# Patient Record
Sex: Female | Born: 1935 | Race: White | Hispanic: No | Smoking: Former smoker
Health system: Southern US, Community
[De-identification: ages and names within clinical notes are randomized; demographics above are authoritative.]

## PROBLEM LIST (undated history)

## (undated) ENCOUNTER — Emergency Department (HOSPITAL_COMMUNITY): Payer: 59 | Source: Home / Self Care

## (undated) DIAGNOSIS — C801 Malignant (primary) neoplasm, unspecified: Secondary | ICD-10-CM

## (undated) DIAGNOSIS — E079 Disorder of thyroid, unspecified: Secondary | ICD-10-CM

## (undated) DIAGNOSIS — K219 Gastro-esophageal reflux disease without esophagitis: Secondary | ICD-10-CM

## (undated) DIAGNOSIS — M1990 Unspecified osteoarthritis, unspecified site: Secondary | ICD-10-CM

## (undated) DIAGNOSIS — C50919 Malignant neoplasm of unspecified site of unspecified female breast: Secondary | ICD-10-CM

## (undated) DIAGNOSIS — R350 Frequency of micturition: Secondary | ICD-10-CM

## (undated) DIAGNOSIS — J302 Other seasonal allergic rhinitis: Secondary | ICD-10-CM

## (undated) DIAGNOSIS — T8859XA Other complications of anesthesia, initial encounter: Secondary | ICD-10-CM

## (undated) DIAGNOSIS — E119 Type 2 diabetes mellitus without complications: Secondary | ICD-10-CM

## (undated) DIAGNOSIS — R42 Dizziness and giddiness: Secondary | ICD-10-CM

## (undated) DIAGNOSIS — E039 Hypothyroidism, unspecified: Secondary | ICD-10-CM

## (undated) DIAGNOSIS — I499 Cardiac arrhythmia, unspecified: Secondary | ICD-10-CM

## (undated) DIAGNOSIS — E785 Hyperlipidemia, unspecified: Secondary | ICD-10-CM

## (undated) DIAGNOSIS — I10 Essential (primary) hypertension: Secondary | ICD-10-CM

## (undated) DIAGNOSIS — T4145XA Adverse effect of unspecified anesthetic, initial encounter: Secondary | ICD-10-CM

## (undated) HISTORY — PX: EYE SURGERY: SHX253

## (undated) HISTORY — DX: Hyperlipidemia, unspecified: E78.5

## (undated) HISTORY — PX: MASTECTOMY: SHX3

## (undated) HISTORY — PX: TONSILLECTOMY: SUR1361

## (undated) HISTORY — PX: INCONTINENCE SURGERY: SHX676

## (undated) HISTORY — PX: CARPAL TUNNEL RELEASE: SHX101

## (undated) HISTORY — PX: COLONOSCOPY: SHX174

## (undated) HISTORY — PX: JOINT REPLACEMENT: SHX530

## (undated) HISTORY — PX: CATARACT EXTRACTION: SUR2

---

## 1997-09-24 ENCOUNTER — Ambulatory Visit (HOSPITAL_COMMUNITY): Admission: RE | Admit: 1997-09-24 | Discharge: 1997-09-24 | Payer: Self-pay | Admitting: Internal Medicine

## 1997-11-18 ENCOUNTER — Other Ambulatory Visit: Admission: RE | Admit: 1997-11-18 | Discharge: 1997-11-18 | Payer: Self-pay | Admitting: *Deleted

## 1997-11-18 ENCOUNTER — Other Ambulatory Visit: Admission: RE | Admit: 1997-11-18 | Discharge: 1997-11-18 | Payer: Self-pay | Admitting: Internal Medicine

## 1997-12-14 ENCOUNTER — Other Ambulatory Visit: Admission: RE | Admit: 1997-12-14 | Discharge: 1997-12-14 | Payer: Self-pay | Admitting: Obstetrics & Gynecology

## 1998-03-23 ENCOUNTER — Other Ambulatory Visit: Admission: RE | Admit: 1998-03-23 | Discharge: 1998-03-23 | Payer: Self-pay | Admitting: Obstetrics & Gynecology

## 1998-11-25 ENCOUNTER — Other Ambulatory Visit: Admission: RE | Admit: 1998-11-25 | Discharge: 1998-11-25 | Payer: Self-pay | Admitting: Internal Medicine

## 1998-12-16 ENCOUNTER — Ambulatory Visit (HOSPITAL_COMMUNITY): Admission: RE | Admit: 1998-12-16 | Discharge: 1998-12-16 | Payer: Self-pay | Admitting: Internal Medicine

## 1998-12-16 ENCOUNTER — Encounter: Payer: Self-pay | Admitting: Internal Medicine

## 1999-08-08 DIAGNOSIS — C50919 Malignant neoplasm of unspecified site of unspecified female breast: Secondary | ICD-10-CM

## 1999-08-08 HISTORY — DX: Malignant neoplasm of unspecified site of unspecified female breast: C50.919

## 1999-12-20 ENCOUNTER — Other Ambulatory Visit: Admission: RE | Admit: 1999-12-20 | Discharge: 1999-12-20 | Payer: Self-pay | Admitting: Internal Medicine

## 1999-12-22 ENCOUNTER — Ambulatory Visit (HOSPITAL_COMMUNITY): Admission: RE | Admit: 1999-12-22 | Discharge: 1999-12-22 | Payer: Self-pay | Admitting: Internal Medicine

## 1999-12-22 ENCOUNTER — Encounter: Payer: Self-pay | Admitting: Internal Medicine

## 1999-12-29 ENCOUNTER — Encounter: Payer: Self-pay | Admitting: Internal Medicine

## 1999-12-29 ENCOUNTER — Encounter: Admission: RE | Admit: 1999-12-29 | Discharge: 1999-12-29 | Payer: Self-pay | Admitting: Internal Medicine

## 2000-01-09 ENCOUNTER — Other Ambulatory Visit: Admission: RE | Admit: 2000-01-09 | Discharge: 2000-01-09 | Payer: Self-pay | Admitting: Surgery

## 2000-01-16 ENCOUNTER — Encounter (INDEPENDENT_AMBULATORY_CARE_PROVIDER_SITE_OTHER): Payer: Self-pay | Admitting: *Deleted

## 2000-01-16 ENCOUNTER — Other Ambulatory Visit: Admission: RE | Admit: 2000-01-16 | Discharge: 2000-01-16 | Payer: Self-pay | Admitting: Surgery

## 2000-01-16 ENCOUNTER — Encounter: Payer: Self-pay | Admitting: Surgery

## 2000-01-16 ENCOUNTER — Encounter: Admission: RE | Admit: 2000-01-16 | Discharge: 2000-01-16 | Payer: Self-pay | Admitting: Surgery

## 2000-01-31 ENCOUNTER — Other Ambulatory Visit: Admission: RE | Admit: 2000-01-31 | Discharge: 2000-01-31 | Payer: Self-pay | Admitting: Obstetrics & Gynecology

## 2000-02-02 ENCOUNTER — Encounter: Payer: Self-pay | Admitting: Surgery

## 2000-02-06 ENCOUNTER — Encounter: Payer: Self-pay | Admitting: Surgery

## 2000-02-06 ENCOUNTER — Inpatient Hospital Stay (HOSPITAL_COMMUNITY): Admission: RE | Admit: 2000-02-06 | Discharge: 2000-02-07 | Payer: Self-pay | Admitting: Surgery

## 2000-02-06 ENCOUNTER — Encounter (INDEPENDENT_AMBULATORY_CARE_PROVIDER_SITE_OTHER): Payer: Self-pay | Admitting: *Deleted

## 2000-02-24 ENCOUNTER — Ambulatory Visit (HOSPITAL_COMMUNITY): Admission: RE | Admit: 2000-02-24 | Discharge: 2000-02-24 | Payer: Self-pay | Admitting: *Deleted

## 2000-02-24 ENCOUNTER — Encounter: Payer: Self-pay | Admitting: *Deleted

## 2000-02-28 ENCOUNTER — Ambulatory Visit (HOSPITAL_COMMUNITY): Admission: RE | Admit: 2000-02-28 | Discharge: 2000-02-28 | Payer: Self-pay | Admitting: *Deleted

## 2000-02-28 ENCOUNTER — Encounter: Payer: Self-pay | Admitting: *Deleted

## 2000-05-10 ENCOUNTER — Other Ambulatory Visit: Admission: RE | Admit: 2000-05-10 | Discharge: 2000-05-10 | Payer: Self-pay | Admitting: Obstetrics & Gynecology

## 2000-06-06 ENCOUNTER — Encounter: Admission: RE | Admit: 2000-06-06 | Discharge: 2000-09-04 | Payer: Self-pay | Admitting: Radiation Oncology

## 2000-06-15 ENCOUNTER — Encounter: Payer: Self-pay | Admitting: *Deleted

## 2000-06-15 ENCOUNTER — Ambulatory Visit (HOSPITAL_COMMUNITY): Admission: RE | Admit: 2000-06-15 | Discharge: 2000-06-15 | Payer: Self-pay | Admitting: *Deleted

## 2000-10-30 ENCOUNTER — Encounter: Payer: Self-pay | Admitting: *Deleted

## 2000-10-30 ENCOUNTER — Encounter: Admission: RE | Admit: 2000-10-30 | Discharge: 2000-10-30 | Payer: Self-pay | Admitting: *Deleted

## 2001-03-11 ENCOUNTER — Other Ambulatory Visit: Admission: RE | Admit: 2001-03-11 | Discharge: 2001-03-11 | Payer: Self-pay | Admitting: Obstetrics & Gynecology

## 2001-03-19 ENCOUNTER — Encounter: Payer: Self-pay | Admitting: *Deleted

## 2001-03-19 ENCOUNTER — Ambulatory Visit (HOSPITAL_COMMUNITY): Admission: RE | Admit: 2001-03-19 | Discharge: 2001-03-19 | Payer: Self-pay | Admitting: *Deleted

## 2001-03-20 ENCOUNTER — Encounter: Payer: Self-pay | Admitting: *Deleted

## 2001-03-20 ENCOUNTER — Ambulatory Visit (HOSPITAL_COMMUNITY): Admission: RE | Admit: 2001-03-20 | Discharge: 2001-03-20 | Payer: Self-pay | Admitting: *Deleted

## 2001-06-05 ENCOUNTER — Encounter (INDEPENDENT_AMBULATORY_CARE_PROVIDER_SITE_OTHER): Payer: Self-pay | Admitting: *Deleted

## 2001-06-05 ENCOUNTER — Ambulatory Visit (HOSPITAL_BASED_OUTPATIENT_CLINIC_OR_DEPARTMENT_OTHER): Admission: RE | Admit: 2001-06-05 | Discharge: 2001-06-05 | Payer: Self-pay | Admitting: Surgery

## 2001-10-30 ENCOUNTER — Ambulatory Visit (HOSPITAL_COMMUNITY): Admission: RE | Admit: 2001-10-30 | Discharge: 2001-10-30 | Payer: Self-pay | Admitting: Gastroenterology

## 2001-11-07 ENCOUNTER — Encounter: Payer: Self-pay | Admitting: *Deleted

## 2001-11-07 ENCOUNTER — Ambulatory Visit (HOSPITAL_COMMUNITY): Admission: RE | Admit: 2001-11-07 | Discharge: 2001-11-07 | Payer: Self-pay | Admitting: *Deleted

## 2002-03-21 ENCOUNTER — Other Ambulatory Visit: Admission: RE | Admit: 2002-03-21 | Discharge: 2002-03-21 | Payer: Self-pay | Admitting: Obstetrics & Gynecology

## 2002-06-05 ENCOUNTER — Ambulatory Visit (HOSPITAL_COMMUNITY): Admission: RE | Admit: 2002-06-05 | Discharge: 2002-06-05 | Payer: Self-pay | Admitting: *Deleted

## 2002-06-05 ENCOUNTER — Encounter: Payer: Self-pay | Admitting: *Deleted

## 2002-11-10 ENCOUNTER — Encounter: Admission: RE | Admit: 2002-11-10 | Discharge: 2002-11-10 | Payer: Self-pay | Admitting: *Deleted

## 2002-11-10 ENCOUNTER — Encounter: Payer: Self-pay | Admitting: *Deleted

## 2003-05-05 ENCOUNTER — Other Ambulatory Visit: Admission: RE | Admit: 2003-05-05 | Discharge: 2003-05-05 | Payer: Self-pay | Admitting: Obstetrics & Gynecology

## 2003-11-13 ENCOUNTER — Encounter: Admission: RE | Admit: 2003-11-13 | Discharge: 2003-11-13 | Payer: Self-pay | Admitting: Oncology

## 2004-01-06 ENCOUNTER — Encounter: Admission: RE | Admit: 2004-01-06 | Discharge: 2004-01-06 | Payer: Self-pay | Admitting: Urology

## 2004-01-07 ENCOUNTER — Ambulatory Visit (HOSPITAL_COMMUNITY): Admission: RE | Admit: 2004-01-07 | Discharge: 2004-01-07 | Payer: Self-pay | Admitting: Urology

## 2004-01-07 ENCOUNTER — Encounter (INDEPENDENT_AMBULATORY_CARE_PROVIDER_SITE_OTHER): Payer: Self-pay | Admitting: Specialist

## 2004-01-07 ENCOUNTER — Ambulatory Visit (HOSPITAL_BASED_OUTPATIENT_CLINIC_OR_DEPARTMENT_OTHER): Admission: RE | Admit: 2004-01-07 | Discharge: 2004-01-07 | Payer: Self-pay | Admitting: Urology

## 2004-08-17 ENCOUNTER — Ambulatory Visit: Payer: Self-pay | Admitting: Oncology

## 2004-11-14 ENCOUNTER — Encounter: Admission: RE | Admit: 2004-11-14 | Discharge: 2004-11-14 | Payer: Self-pay | Admitting: Oncology

## 2005-02-16 ENCOUNTER — Ambulatory Visit: Payer: Self-pay | Admitting: Oncology

## 2005-06-05 ENCOUNTER — Other Ambulatory Visit: Admission: RE | Admit: 2005-06-05 | Discharge: 2005-06-05 | Payer: Self-pay | Admitting: Obstetrics & Gynecology

## 2005-09-04 ENCOUNTER — Ambulatory Visit (HOSPITAL_COMMUNITY): Admission: RE | Admit: 2005-09-04 | Discharge: 2005-09-04 | Payer: Self-pay | Admitting: Internal Medicine

## 2005-09-13 ENCOUNTER — Ambulatory Visit (HOSPITAL_COMMUNITY): Admission: RE | Admit: 2005-09-13 | Discharge: 2005-09-14 | Payer: Self-pay | Admitting: Urology

## 2005-10-26 ENCOUNTER — Ambulatory Visit: Payer: Self-pay | Admitting: Oncology

## 2005-11-15 ENCOUNTER — Encounter: Admission: RE | Admit: 2005-11-15 | Discharge: 2005-11-15 | Payer: Self-pay | Admitting: Oncology

## 2005-12-18 ENCOUNTER — Ambulatory Visit (HOSPITAL_COMMUNITY): Admission: RE | Admit: 2005-12-18 | Discharge: 2005-12-18 | Payer: Self-pay | Admitting: Oncology

## 2005-12-20 ENCOUNTER — Ambulatory Visit: Payer: Self-pay | Admitting: Oncology

## 2006-06-13 ENCOUNTER — Ambulatory Visit: Payer: Self-pay | Admitting: Oncology

## 2006-06-15 LAB — COMPREHENSIVE METABOLIC PANEL
AST: 16 U/L (ref 0–37)
Alkaline Phosphatase: 97 U/L (ref 39–117)
Glucose, Bld: 123 mg/dL — ABNORMAL HIGH (ref 70–99)
Sodium: 137 mEq/L (ref 135–145)
Total Bilirubin: 0.4 mg/dL (ref 0.3–1.2)
Total Protein: 7 g/dL (ref 6.0–8.3)

## 2006-06-15 LAB — CBC WITH DIFFERENTIAL/PLATELET
EOS%: 0.9 % (ref 0.0–7.0)
Eosinophils Absolute: 0.1 10*3/uL (ref 0.0–0.5)
LYMPH%: 15.3 % (ref 14.0–48.0)
MCH: 32.3 pg (ref 26.0–34.0)
MCHC: 33.9 g/dL (ref 32.0–36.0)
MCV: 95.3 fL (ref 81.0–101.0)
MONO%: 5.8 % (ref 0.0–13.0)
NEUT#: 9.2 10*3/uL — ABNORMAL HIGH (ref 1.5–6.5)
Platelets: 261 10*3/uL (ref 145–400)
RBC: 4.01 10*6/uL (ref 3.70–5.32)

## 2006-11-19 ENCOUNTER — Ambulatory Visit (HOSPITAL_COMMUNITY): Admission: RE | Admit: 2006-11-19 | Discharge: 2006-11-19 | Payer: Self-pay | Admitting: Internal Medicine

## 2007-01-02 ENCOUNTER — Ambulatory Visit (HOSPITAL_COMMUNITY): Admission: RE | Admit: 2007-01-02 | Discharge: 2007-01-02 | Payer: Self-pay | Admitting: Internal Medicine

## 2007-07-01 ENCOUNTER — Ambulatory Visit: Payer: Self-pay

## 2007-07-24 ENCOUNTER — Ambulatory Visit: Payer: Self-pay | Admitting: Oncology

## 2007-07-24 LAB — CBC WITH DIFFERENTIAL/PLATELET
Basophils Absolute: 0.1 10*3/uL (ref 0.0–0.1)
Eosinophils Absolute: 0.1 10*3/uL (ref 0.0–0.5)
HGB: 13 g/dL (ref 11.6–15.9)
LYMPH%: 23.9 % (ref 14.0–48.0)
MCV: 93.7 fL (ref 81.0–101.0)
MONO%: 8.3 % (ref 0.0–13.0)
NEUT#: 4.8 10*3/uL (ref 1.5–6.5)
Platelets: 223 10*3/uL (ref 145–400)
RBC: 4.03 10*6/uL (ref 3.70–5.32)

## 2007-07-24 LAB — COMPREHENSIVE METABOLIC PANEL
Alkaline Phosphatase: 75 U/L (ref 39–117)
BUN: 26 mg/dL — ABNORMAL HIGH (ref 6–23)
Glucose, Bld: 75 mg/dL (ref 70–99)
Total Bilirubin: 0.5 mg/dL (ref 0.3–1.2)

## 2008-01-06 ENCOUNTER — Ambulatory Visit (HOSPITAL_COMMUNITY): Admission: RE | Admit: 2008-01-06 | Discharge: 2008-01-06 | Payer: Self-pay | Admitting: Family Medicine

## 2008-11-18 ENCOUNTER — Ambulatory Visit (HOSPITAL_COMMUNITY): Admission: RE | Admit: 2008-11-18 | Discharge: 2008-11-18 | Payer: Self-pay | Admitting: Internal Medicine

## 2009-01-20 ENCOUNTER — Ambulatory Visit (HOSPITAL_COMMUNITY): Admission: RE | Admit: 2009-01-20 | Discharge: 2009-01-20 | Payer: Self-pay | Admitting: Internal Medicine

## 2009-04-27 ENCOUNTER — Emergency Department (HOSPITAL_COMMUNITY): Admission: EM | Admit: 2009-04-27 | Discharge: 2009-04-27 | Payer: Self-pay | Admitting: Emergency Medicine

## 2010-01-24 ENCOUNTER — Ambulatory Visit (HOSPITAL_COMMUNITY): Admission: RE | Admit: 2010-01-24 | Discharge: 2010-01-24 | Payer: Self-pay | Admitting: Internal Medicine

## 2010-08-28 ENCOUNTER — Encounter: Payer: Self-pay | Admitting: Oncology

## 2010-12-23 NOTE — Op Note (Signed)
NAME:  [name], [name]                         ACCOUNT NO.:  [id number]   MEDICAL RECORD NO.:  [id number]                   PATIENT TYPE:  AMB   LOCATION:  [facility]                                 FACILITY:  [facility]   PHYSICIAN:  Sigmund I. Patsi Sears, M.D.         DATE OF BIRTH:  08-Oct-1927   DATE OF PROCEDURE:  01/07/2004  DATE OF DISCHARGE:                                 OPERATIVE REPORT   PREOPERATIVE DIAGNOSIS:  Bladder lesion; history of breast cancer.   POSTOPERATIVE DIAGNOSIS:  Bladder lesion; history of breast cancer.   PROCEDURE:  1. Cystoscopy.  2. Cold cup biopsy of bladder lesion.  3. Fulguration of biopsy site.  4. Placement of E-string.   ATTENDING SURGEON:  Jethro Bolus, MD   RESIDENT SURGEON:  Rhae Lerner, MD   ANESTHESIA:  General endotracheal.   COMPLICATIONS:  None.   INDICATION FOR PROCEDURE:  Ms. Sachse is a 77 year old female, who was  found to have a suspicious bladder lesion on office cystoscopy.  The patient  presents for cold cup biopsy of this lesion.   DESCRIPTION OF THE PROCEDURE IN DETAIL:  The patient was brought to the  operating room.  Following induction of anesthesia, was placed in the dorsal  lithotomy position and prepped and draped in the usual sterile fashion.  A  rigid cystoscope was subsequently introduced into the bladder and a  cystoscopic examination performed.  Both right and left ureteral orifices  were identified and efflux of clear urine confirmed from each.  The  remainder of the bladder trigone appeared to be free of any signs of tumor,  stone, diverticulum, or other abnormality.  A complete examination of the  bladder mucosa was subsequently performed in a systematic fashion.  The  bladder was noted to have grade 2 trabeculation throughout.  There were no  signs of any tumor, stone, or diverticulum present.  On the posterior dome  of the bladder, lying just posterior to the air bubble and the midline, a  small bluish lesion was present, coinciding with the unusual lesion noted on  the office cystoscopy.  A cold cup biopsy forceps was subsequently  introduced through the introducer sheath and used to remove this lesion.  The lesion was subsequently sent for pathology.  Bovie cautery was then used  to fulgurate the biopsy site.  Prior to removing the cystoscope, the  patient's bladder capacity was noted to be greater than 1000 mL.  The  cystoscope was subsequently removed after drainage of the bladder and an E-  string placed into the vaginal vault.  At this point, the patient was  allowed to  awaken, and the procedure was ended.  The patient tolerated the procedure  well.  There were no complications.  Please note that Dr. Jethro Bolus  was present through the entire case and participated in all aspects of the  procedure.     Bailey Mech, MD  Sigmund I. Patsi Sears, M.D.    JP/MEDQ  D:  01/07/2004  T:  01/07/2004  Job:  [id number]

## 2010-12-23 NOTE — Op Note (Signed)
[name], [name]               ACCOUNT NO.:  [id number]   MEDICAL RECORD NO.:  [id number]          PATIENT TYPE:  OIB   LOCATION:  1010                         FACILITY:  [facility]   PHYSICIAN:  Valetta Fuller, M.D.  DATE OF BIRTH:  January 06, 1928   DATE OF PROCEDURE:  09/13/2005  DATE OF DISCHARGE:                                 OPERATIVE REPORT   PREOPERATIVE DIAGNOSIS:  Stress incontinence.   POSTOPERATIVE DIAGNOSIS:  Stress incontinence.   PROCEDURE PERFORMED:  Retropubic suburethral sling (Lynx system).   SURGEON:  Valetta Fuller, M.D.   ANESTHESIA:  General.   INDICATIONS:  [name] is 77 years of age. She saw one of my partners in  the past for further assessment of incontinence but chose to transfer her  care to my practice. The patient complained primarily of stress  incontinence. She has had some mixed incontinence in the past but her  primary complaint has been stress urinary leakage. The patient previously  had urodynamics which showed a stable relatively compliant bladder. She did  have objective stress leakage with a reduced Valsalva leak point pressure of  approximately 50 cm of water pressure. Clinical exam showed a mild cystocele  but nothing that really required surgical correction. She did have evidence  of obvious urethral hypermobility. We discussed several options with her.  She felt that this was interfering with her quality of life enough to a  point where she would prefer surgical correction. She appeared to understand  the potential advantages as well as the complications and disadvantages of  this type of surgery. She appeared to understand the success rates and the  possible complications and problems that can occur. We felt that a  retropubic sling might be better than a transobturator sling given her lower  Valsalva leak point pressure. She presents now for that procedure.   TECHNIQUE AND FINDINGS:  The patient was brought to the operating room  where  she had successful induction of general anesthesia. She was placed in  lithotomy position, prepped and draped in the usual manner. She was placed  lithotomy position. A Foley catheter was inserted and her bladder was  completely drained. A weighted vaginal speculum was utilized. Again she had  a grade 1 cystocele. She did have some atrophic vaginal change. The anterior  vaginal wall overlying the urethra was infiltrated with lidocaine. A small  midline incision was made over the mid urethra. Vaginal planes were then  dissected enough to allow for finger palpation of the endopelvic fascia. Two  separate small stab incisions were made just lateral to the midline at the  top of the pubic symphysis. The [place] curved needle passers were then passed  behind the pubic symphysis with direct finger tip control until we poked  through the endopelvic fascia and then these were brought out lateral to the  urethra bilaterally. With the needles in positioned, the Foley catheter was  removed and flexible cystoscopy was performed. I saw no evidence of any  bladder injury and the needles did not appear to be in the bladder. They  appeared  to be well positioned at the level of the bladder neck laterally on  both sides. The sling was then grasped with the needle passers and brought  up to the mid urethra. A right angled clamp was used underneath the sling to  assure proper tensioning. The redundant sling was then cut at the level of  the skin after the sheath was removed. Of note, this was done after the  cystoscope was removed and the Foley catheter was reinserted and the bladder  completely drained. A vaginal incision was copiously  irrigated. The vaginal incision was then closed with a running 2-0 Vicryl  suture and some packing with Estrace was applied. The Foley catheter was  left to gravity drainage. The patient appeared to tolerate the procedure  well and there were no obvious  complications.           ______________________________  Valetta Fuller, M.D.  Electronically Signed     DSG/MEDQ  D:  09/13/2005  T:  09/13/2005  Job:  [id number]

## 2010-12-23 NOTE — Op Note (Signed)
[place]  Patient:    [name], [name] Visit Number: [id number] MRN: [id number]          Service Type: END Location: ENDO Attending Physician:  Nelda Marseille Dictated by:   Petra Kuba, M.D. Proc. Date: 10/30/01 Admit Date:  10/30/2001   CC:         Marinus Maw, M.D.  Aliene Altes, M.D.   Operative Report  PROCEDURE:  Colonoscopy.  INDICATIONS FOR PROCEDURE:  Mild gastrointestinal symptoms, due for colonic screening.  CONSENT:  Consent was signed after risks, benefits, methods, and options were thoroughly discussed in the office.  MEDICATIONS USED: 1. Demerol 25 mg. 2. Versed 4 mg.  DESCRIPTION OF PROCEDURE:  Rectal inspection was pertinent for small external hemorrhoids.  Digital examination was negative.  Video pediatric adjustable colonoscope was inserted, and despite a tortuous colon, by rolling her on her back and some abdominal pressure, was able to advance to the cecum which was identified by the appendiceal orifice and the ileocecal valve.  No abnormalities were seen on insertion.  The scope was slowly withdrawn.  Prep was adequate.  There was some liquid stool that required washing and suctioning.  She was tortuous in the sigmoid and the flexures, and when we fell back around a loop we did try to readvance around the curves to decrease the chance of missing things.  On slow withdrawal back to the rectum no abnormalities were seen.  Once back in the rectum the scope was retroflexed. Pertinent for some internal hemorrhoids.  Scope was straightened, and then advanced a short ways up the sigmoid.  Air was suctioned, scope was removed. The patient tolerated the procedure well.  There was no obvious immediate complication.  ENDOSCOPIC DIAGNOSES: 1. Internal and external hemorrhoids. 2. Otherwise within normal limits to the cecum.  PLAN:  Happy to see back p.r.n.  Otherwise, return care to Dr. Katrinka Blazing and  Dr. Oneta Rack for the customary health care maintenance to include yearly rectals and guaiacs, and if doing well medically, would repeat colonic screening in 5 years with consideration based on tortuosity of a one time either BE or virtual colonoscopy or repeating colonoscopy. Dictated by:   Petra Kuba, M.D. Attending Physician:  Nelda Marseille DD:  10/30/01 TD:  10/31/01 Job: [id number]

## 2010-12-23 NOTE — Op Note (Signed)
[place]  Patient:    [name], [name]                        MRN: [id number] Proc. Date: 02/06/00 Attending:  Currie Paris, M.D. CC:         Marinus Maw, M.D.                           Operative Report  ACCOUNT NUMBER:  [id number]  OFFICE MEDICAL RECORD NUMBER:  [id number]  PREOPERATIVE DIAGNOSIS:  Invasive lobular carcinoma of the right breast.  POSTOPERATIVE DIAGNOSIS:  Invasive lobular carcinoma of the right breast.  OPERATION:  Right total mastectomy with blue dye injection and sentinel node dissection.  SURGEON:  Currie Paris, M.D.  ASSISTANT:  ANESTHESIA:  General endotracheal.  INDICATIONS:  This patient is a 77 year old with a mass in the right breast in about the mid lateral position, perhaps a little more to the upper outer quadrant than the lower outer.  Cor biopsy had shown this to be invasive lobular.  Discussions were held with the patient regarding treatment and she requested mastectomy with sentinel node dissection.  DESCRIPTION OF PROCEDURE:  The patient was brought to the operating room, having had her radioactive dye injected two hours preoperatively.  After satisfactory general endotracheal anesthesia was obtained, the breast was prepped and draped.  An elliptical incision was outlined.  Blue dye was injected subareolarly and around the mass and in the dermis overlying the mass.  A needle probe was used to localize the hot area, which was low anterior axilla and the skin overlying this was marked.  A skin incision was then made for the superior half of the flap and the superior flap raised medially to the sternum, superiorly to the clavicle, and laterally until we lifted up the axillary skin.  At this point, I used the needle probe and saw a little blue with an area that appeared to be hot and excised that, but this was some blue dye that had leaked down some of the soft tissue planes and there  was no area that was hot.  I thought that this represented breast tissue and not a true nodal area.  However, the blue lymphatics were then noted going into the axilla and using the needle probe, another hot area was identified with the blue dye going directly into this. This was in the true axilla whereas the first nodule was superficial to the axilla.  This hot blue node was excised.  There actually to be two together.  Another blue lymphatic was noted and traced and another hot node identified that was also blue and excised.  There were no other blue nodes nor any other hot nodes nor any other palpable adenopathy remaining in the axilla.  While waiting for pathology to review these nodes, the inferior flap was made going medially to the sternum, inferiorly to just below the inframammary fold, and laterally to the latissimus.  The breast was removed from the underlying muscle using coagulation current of the cautery.  The final lateral attachments to the latissimus, serratus, and axilla were divided with the cautery.  At this point, pathology reported that there was no obvious malignant disease, although we do recognize with lobular that touch preps can be false negatives.  Nevertheless, there appeared to be no other active metastatic disease within the axilla at least grossly nor even any  enlarged lymph nodes.  The wound was copiously irrigated and checked for hemostasis.  Once everything was dry, I placed two 19 Blake drains and closed the skin with staples.  The drains were secured with a 3-0 nylon.  The patient tolerated the procedure well.  There were no operative complications.  All counts were correct. DD:  02/06/00 TD:  02/06/00 Job: [id number]

## 2011-01-04 ENCOUNTER — Other Ambulatory Visit: Payer: Self-pay | Admitting: Internal Medicine

## 2011-02-22 ENCOUNTER — Other Ambulatory Visit: Payer: Self-pay | Admitting: Internal Medicine

## 2011-03-09 ENCOUNTER — Other Ambulatory Visit (HOSPITAL_COMMUNITY): Payer: Self-pay | Admitting: Internal Medicine

## 2011-03-09 DIAGNOSIS — Z1231 Encounter for screening mammogram for malignant neoplasm of breast: Secondary | ICD-10-CM

## 2011-03-30 ENCOUNTER — Encounter (HOSPITAL_COMMUNITY)
Admission: RE | Admit: 2011-03-30 | Discharge: 2011-03-30 | Disposition: A | Discharge status: Home / Self Care | Payer: Medicare Other | Source: Ambulatory Visit | Attending: Orthopaedic Surgery | Admitting: Orthopaedic Surgery

## 2011-03-30 ENCOUNTER — Other Ambulatory Visit (HOSPITAL_COMMUNITY): Payer: Self-pay | Admitting: Orthopaedic Surgery

## 2011-03-30 ENCOUNTER — Ambulatory Visit (HOSPITAL_COMMUNITY)
Admission: RE | Admit: 2011-03-30 | Discharge: 2011-03-30 | Disposition: A | Discharge status: Home / Self Care | Payer: Medicare Other | Source: Ambulatory Visit | Attending: Orthopaedic Surgery | Admitting: Orthopaedic Surgery

## 2011-03-30 DIAGNOSIS — Z01811 Encounter for preprocedural respiratory examination: Secondary | ICD-10-CM | POA: Insufficient documentation

## 2011-03-30 DIAGNOSIS — M1711 Unilateral primary osteoarthritis, right knee: Secondary | ICD-10-CM

## 2011-03-30 DIAGNOSIS — IMO0002 Reserved for concepts with insufficient information to code with codable children: Secondary | ICD-10-CM | POA: Insufficient documentation

## 2011-03-30 DIAGNOSIS — Z01812 Encounter for preprocedural laboratory examination: Secondary | ICD-10-CM | POA: Insufficient documentation

## 2011-03-30 DIAGNOSIS — M1710 Unilateral primary osteoarthritis, unspecified knee: Secondary | ICD-10-CM | POA: Insufficient documentation

## 2011-03-30 DIAGNOSIS — Z01818 Encounter for other preprocedural examination: Secondary | ICD-10-CM | POA: Insufficient documentation

## 2011-03-30 LAB — DIFFERENTIAL
Lymphocytes Relative: 26 % (ref 12–46)
Neutro Abs: 5.4 10*3/uL (ref 1.7–7.7)

## 2011-03-30 LAB — COMPREHENSIVE METABOLIC PANEL
ALT: 18 U/L (ref 0–35)
AST: 23 U/L (ref 0–37)
CO2: 33 mEq/L — ABNORMAL HIGH (ref 19–32)
Chloride: 95 mEq/L — ABNORMAL LOW (ref 96–112)
GFR calc Af Amer: >60 mL/min (ref >60)
GFR calc non Af Amer: 59 mL/min — ABNORMAL LOW (ref >60)
Glucose, Bld: 78 mg/dL (ref 70–99)
Sodium: 135 mEq/L (ref 135–145)
Total Bilirubin: 0.3 mg/dL (ref 0.3–1.2)

## 2011-03-30 LAB — URINALYSIS, ROUTINE W REFLEX MICROSCOPIC
Glucose, UA: NEGATIVE mg/dL
Hgb urine dipstick: NEGATIVE
Ketones, ur: NEGATIVE mg/dL
Protein, ur: NEGATIVE mg/dL
Specific Gravity, Urine: 1.012 (ref 1.005–1.030)

## 2011-03-30 LAB — CBC
Hemoglobin: 13.3 g/dL (ref 12.0–15.0)
MCH: 32 pg (ref 26.0–34.0)
MCV: 94 fL (ref 78.0–100.0)
RBC: 4.16 MIL/uL (ref 3.87–5.11)

## 2011-03-30 LAB — ABO/RH: ABO/RH(D): O POS

## 2011-03-30 LAB — PROTIME-INR: Prothrombin Time: 12.9 seconds (ref 11.6–15.2)

## 2011-03-30 LAB — APTT: aPTT: 38 seconds — ABNORMAL HIGH (ref 24–37)

## 2011-03-31 LAB — URINE CULTURE
Colony Count: NO GROWTH
Culture  Setup Time: 201208231248

## 2011-04-05 ENCOUNTER — Ambulatory Visit (HOSPITAL_COMMUNITY)
Admission: RE | Admit: 2011-04-05 | Discharge: 2011-04-05 | Disposition: A | Discharge status: Home / Self Care | Payer: Medicare Other | Source: Ambulatory Visit | Attending: Internal Medicine | Admitting: Internal Medicine

## 2011-04-05 DIAGNOSIS — Z1231 Encounter for screening mammogram for malignant neoplasm of breast: Secondary | ICD-10-CM | POA: Insufficient documentation

## 2011-04-11 ENCOUNTER — Inpatient Hospital Stay (HOSPITAL_COMMUNITY)
Admission: RE | Admit: 2011-04-11 | Discharge: 2011-04-14 | DRG: 470 | Disposition: A | Discharge status: Skilled Nursing Facility | Payer: Medicare Other | Source: Ambulatory Visit | Attending: Orthopaedic Surgery | Admitting: Orthopaedic Surgery

## 2011-04-11 DIAGNOSIS — I10 Essential (primary) hypertension: Secondary | ICD-10-CM | POA: Diagnosis present

## 2011-04-11 DIAGNOSIS — E119 Type 2 diabetes mellitus without complications: Secondary | ICD-10-CM | POA: Diagnosis present

## 2011-04-11 DIAGNOSIS — Z853 Personal history of malignant neoplasm of breast: Secondary | ICD-10-CM

## 2011-04-11 DIAGNOSIS — E039 Hypothyroidism, unspecified: Secondary | ICD-10-CM | POA: Diagnosis present

## 2011-04-11 DIAGNOSIS — M1710 Unilateral primary osteoarthritis, unspecified knee: Principal | ICD-10-CM | POA: Diagnosis present

## 2011-04-11 DIAGNOSIS — Z79899 Other long term (current) drug therapy: Secondary | ICD-10-CM

## 2011-04-11 DIAGNOSIS — D62 Acute posthemorrhagic anemia: Secondary | ICD-10-CM | POA: Diagnosis not present

## 2011-04-11 LAB — GLUCOSE, CAPILLARY
Glucose-Capillary: 96 mg/dL (ref 70–99)
Glucose-Capillary: 143 mg/dL — ABNORMAL HIGH (ref 70–99)

## 2011-04-12 LAB — GLUCOSE, CAPILLARY
Glucose-Capillary: 118 mg/dL — ABNORMAL HIGH (ref 70–99)
Glucose-Capillary: 167 mg/dL — ABNORMAL HIGH (ref 70–99)

## 2011-04-12 LAB — CBC
HCT: 27.6 % — ABNORMAL LOW (ref 36.0–46.0)
HCT: 28.1 % — ABNORMAL LOW (ref 36.0–46.0)
Hemoglobin: 9.4 g/dL — ABNORMAL LOW (ref 12.0–15.0)
MCH: 32.2 pg (ref 26.0–34.0)
MCHC: 34.1 g/dL (ref 30.0–36.0)
Platelets: 187 10*3/uL (ref 150–400)
RDW: 14 % (ref 11.5–15.5)
WBC: 10.6 10*3/uL — ABNORMAL HIGH (ref 4.0–10.5)

## 2011-04-12 LAB — BASIC METABOLIC PANEL
BUN: 17 mg/dL (ref 6–23)
GFR calc non Af Amer: 55 mL/min — ABNORMAL LOW (ref >60)
Glucose, Bld: 113 mg/dL — ABNORMAL HIGH (ref 70–99)
Potassium: 4.3 mEq/L (ref 3.5–5.1)

## 2011-04-12 NOTE — Op Note (Signed)
[name], Bridget NO.:  [id number]  MEDICAL RECORD NO.:  [id number]  LOCATION:  2857                         FACILITY:  [facility]  PHYSICIAN:  Claude Manges. Cedar Roseman, M.D.DATE OF BIRTH:  03-Nov-1927  DATE OF PROCEDURE:  04/11/2011 DATE OF DISCHARGE:                              OPERATIVE REPORT   PREOPERATIVE DIAGNOSIS:  End-stage osteoarthritis, right knee.  POSTOPERATIVE DIAGNOSIS:  End-stage osteoarthritis, right knee.  PROCEDURE:  Right total knee replacement surgery.  SURGEON:  Claude Manges. Cleophas Dunker, MD  ASSISTANT:  Oris Drone. Petrarca, PAC  ANESTHESIA:  General with supplemental femoral nerve block.  COMPLICATIONS:  None.  COMPONENTS:  DePuy LCS medium femoral component #2 rotating keeled tibial tray, 10-mm bridging bearing, 3-peg metal back rotating patella, components were secured with polymethyl methacrylate.  DESCRIPTION OF PROCEDURE:  Ms. Gittens was met in the holding area, identified the right knee as the appropriate operative site, did receive a preoperative femoral nerve block.  The patient was then transported room #1 and placed under general anesthesia without difficulty.  Nursing staff inserted a Foley catheter.  Urine was clear.  The right lower extremity was placed in thigh tourniquet.  The leg was prepped with Betadine scrub and DuraPrep.  The tourniquet to the midfoot, sterile draping was performed.  A midline longitudinal incision was made, centered about the patella, extending from the superior pouch to the tibial tubercle via sharp dissection, incision was carried down to subcutaneous tissue.  First layer of capsule was incised in midline and medial parapatellar incision was then made the Bovie through the deep capsule.  There was minimal clear yellow joint effusion.  The patella was everted to 180 degrees and the knee flexed to 90 degrees.  There was near complete absence of articular cartilage on the lateral femoral condyle with  large osteophytes particularly laterally and to lesser extent along the medial femoral condyle.  There large flakes of articular cartilage loose from the lateral femoral condyle.  The patient had increased valgus with weightbearing.  A synovectomy was performed.  Osteophytes removed and measured a medium femoral component.  Initial cut was then made transversely in the proximal tibia the 7- degree angle of declination.  We checked our alignment on several occasions before accepting in the final cut, thought it was a perfectly anatomic.  Subsequent cuts were then made on the femur using the medium femoral guide, a 4-degree distal femoral valgus cut was utilized.  MCL and LCL remained intact throughout the procedure.  Flexion and extension gaps were perfectly symmetrical at 10 mm.  Lamina spreader was inserted into the joint, I removed the medial lateral menisci as well as ACL and PCL and partially debrided the Baker cyst posterior medially.  Final cut was then made in the femur for tapering and to obtain the center holes.  Osteophytes removed from the posterior femoral condyle with a curved three-quarter inch osteotome.  Retractor was then placed about the tibia.  We measured a #2 tibial tray.  The center hole was then made followed by the keeled cut with the tibial jig still in place.  We applied to 10 mm bridging bearing followed by the trial femoral component.  We had full range of motion and full extension and no opening with varus or valgus stress and very nice alignment in terms of the rotating platform.  The patella was prepared by removing 10 mm of bone leaving 13 mm patella thickness, trial patella was applied until full range of motion was perfectly stable.  Trial components removed.  The joint was copiously irrigated with saline solution.  Retractor was then placed about the tibia.  Tibia was initially inserted with methacrylate, it was impacted and placed after  finger packing the center hole.  Extraneous methacrylate was removed from around its edges. The femur was then impacted with the methacrylate.  The knee placed in full extension to further impacted components.  There appeared to be in anatomic position.  Any further extraneous methacrylate was removed and we irrigated the joint with saline.  Patella was then applied with methacrylate and patellar clamp.  Approximately 15 minutes methacrylate was matured and hardened, the joint was explored.  There was no further extraneous methacrylate.  The wound was irrigated with saline solution.  0.25% Marcaine with epinephrine injected into the deep capsule circumferentially. Tourniquet was then deflated at approximately an hour and 10 minutes. Gross bleeders were Bovie coagulated.  Bone wax was placed around bleeding bone edges.  Hemovac was inserted.  Deep capsule was then closed with interrupted #1 Ethibond.  Superficial capsule closed with running 0 Vicryl, subcu with 2-0 Vicryl, 3-0 Monocryl, skin closed with skin clips.  Sterile bulky dressing was applied followed by the patient's support stocking.  The patient tolerated the procedure without complications.     Claude Manges. Cleophas Dunker, M.D.     PWW/MEDQ  D:  04/11/2011  T:  04/11/2011  Job:  [id number]  Electronically Signed by Norlene Campbell M.D. on 04/12/2011 02:06:41 PM

## 2011-04-13 LAB — CBC
MCH: 31.8 pg (ref 26.0–34.0)
MCHC: 34 g/dL (ref 30.0–36.0)
Platelets: 203 10*3/uL (ref 150–400)
RBC: 3.14 MIL/uL — ABNORMAL LOW (ref 3.87–5.11)
RDW: 13.9 % (ref 11.5–15.5)

## 2011-04-13 LAB — GLUCOSE, CAPILLARY
Glucose-Capillary: 137 mg/dL — ABNORMAL HIGH (ref 70–99)
Glucose-Capillary: 165 mg/dL — ABNORMAL HIGH (ref 70–99)
Glucose-Capillary: 147 mg/dL — ABNORMAL HIGH (ref 70–99)

## 2011-04-13 LAB — BASIC METABOLIC PANEL
Calcium: 9.4 mg/dL (ref 8.4–10.5)
Creatinine, Ser: 0.66 mg/dL (ref 0.50–1.10)
GFR calc Af Amer: >60 mL/min (ref >60)
GFR calc non Af Amer: >60 mL/min (ref >60)
Sodium: 137 mEq/L (ref 135–145)

## 2011-04-14 LAB — CBC
Hemoglobin: 9.5 g/dL — ABNORMAL LOW (ref 12.0–15.0)
MCHC: 34.4 g/dL (ref 30.0–36.0)
RDW: 13.8 % (ref 11.5–15.5)

## 2011-04-14 LAB — BASIC METABOLIC PANEL
CO2: 28 mEq/L (ref 19–32)
Calcium: 9.6 mg/dL (ref 8.4–10.5)
GFR calc non Af Amer: >60 mL/min (ref >60)
Sodium: 136 mEq/L (ref 135–145)

## 2011-04-14 LAB — GLUCOSE, CAPILLARY
Glucose-Capillary: 117 mg/dL — ABNORMAL HIGH (ref 70–99)
Glucose-Capillary: 131 mg/dL — ABNORMAL HIGH (ref 70–99)

## 2011-05-01 NOTE — Discharge Summary (Signed)
[name], VEIGA NO.:  [id number]  MEDICAL RECORD NO.:  [id number]  LOCATION:  5005                         FACILITY:  [facility]  PHYSICIAN:  Claude Manges. Janeen Watson, M.D.DATE OF BIRTH:  1927-10-04  DATE OF ADMISSION:  04/11/2011 DATE OF DISCHARGE:  04/14/2011                        DISCHARGE SUMMARY - REFERRING   ADMISSION DIAGNOSIS:  End-stage osteoarthritis of the right knee.  DISCHARGE DIAGNOSES: 1. End-stage osteoarthritis of the right knee. 2. History of hypertension. 3. History of right breast carcinoma. 4. History of non-insulin-dependent diabetes mellitus. 5. History of hypertension. 6. Acute blood loss anemia.  PROCEDURE:  Right total knee arthroplasty.  HISTORY OF PRESENT ILLNESS:  [name] is a very pleasant 77 year old white female who has had worsening right knee pain which is an intermittent, severe aching pain which is now more constant.  She had multiple injection including that of corticosteroids with diminishing benefits.  Has affected her activities of daily living.  She is unable to perform her activities.  Rest has been helpful.  Ambulation has worsened her symptoms.  She has radiographic end-stage lateral OA of the knee.  Indicated now for right total knee arthroplasty.  HOSPITAL COURSE:  An 77 year old white female admitted on April 11, 2011 after appropriate laboratory studies were obtained as well as 1 g Ancef IV on-call to the operating room, was taken to the operating room, where she underwent a right total knee arthroplasty by Dr. Norlene Campbell assisted by Oris Drone. Petrarca, P.A.-C.  This involved a DePuy LCS medium femoral component with a #2 rotating keel tibial tray and a 10-mm bridging bearing and a 3-peg metal back rotating patella with components all secured with polymethylmethacrylate.  She tolerated the procedure well.  She was placed intraoperatively on IV Tylenol 1 g near induction.  She had a Foley placed  intraoperatively.  She was started on Xarelto 10 mg p.o. daily starting at 10:00 p.m. that evening as anticoagulation therapy.  SCDs and thigh-high TED hose were also used. Continued on Ancef 1 g IV q.6 h. x3 doses.  She was continued with Tylenol 1 g IV q.6 h. x4 doses, then Tylenol 650 mg p.o. q.6 h.  She has also used oxycodone IR 5-10 mg q.3 h. p.r.n. pain.  Consultation with Physical Therapy as well as Social Service for transfer to [facility] at the time of discharge.  A CPM was placed from zero to 60 degrees and increased by 5 degrees per day and tolerated that at 6-8 hours per day.  She was allowed out of bed to chair the following day.  She was weaned off of PCA and a saline lock was placed.  She was weaned off her O2 keeping sats greater than 92%.  Incentive spirometry q.1 h. while awake. CBC at 3:00 p.m. was also ordered.  Her hemoglobin remained stable during her hospital course.  She had pulled her own Hemovac out on postop day #1 in the evening.  Postop day #2, she was continuing with physical therapy as per protocol. Dressing change was performed.  She did have an unremarkable hospital course, was discharged on the 7th to return back to the office in followup.  LABORATORY STUDIES:  Admission hemoglobin was 13.3, hematocrit 39.1%, white count 8600, platelets 239,000.  The lowest hemoglobin was 9.4, hematocrit 27.6 on the fifth of September.  Hemoglobin on September 6 was 10.0, hematocrit 29.4%, white count 13,400, and platelet was 203,000.  Pro-time was 12.9, INR 0.95, PTT was 38.  Sodium 135, potassium 4.2, chloride 195, CO2 of 33, glucose 78, BUN 23, creatinine 0.91, GFR was 59.  Bilirubin 0.3, alk phos 78, SGOT 23, SGPT 18, total protein 7.6, albumin 4.1, calcium 10.7.  Urinalysis; yellow, clear.  Negative leukocytes and nitrates.  Blood type was O+, antibody screen negative.  MRSA and Staph aureus were negative by nasal swab.  Urine culture showed no growth.   September 6 reveals sodium 137, potassium 3.8, chloride 100, CO2 of 28, glucose 142, BUN 14, creatinine 0.66, GFR greater than 60.  RADIOGRAPHIC STUDIES:  Chest x-ray of March 30, 2011 reveals no evidence of acute cardiac or pulmonary process.  DISCHARGE INSTRUCTIONS:  The med reconciliation sheet is attached to the main chart.  Her postop directions for total joint replacement discharge instructions are also placed on this.  May contain the full description of use of CPM as well as her stockings and when to return for her followup which will be April 26, 2011.  She was discharged in improved condition.     Oris Drone Petrarca, P.A.-C.   ______________________________ Claude Manges. Cleophas Dunker, M.D.    BDP/MEDQ  D:  04/13/2011  T:  04/13/2011  Job:  [id number]  Electronically Signed by Jacqualine Code P.A.-C. on 04/14/2011 06:02:12 PM Electronically Signed by Norlene Campbell M.D. on 05/01/2011 12:32:04 PM

## 2011-10-04 ENCOUNTER — Emergency Department (HOSPITAL_COMMUNITY)
Admission: EM | Admit: 2011-10-04 | Discharge: 2011-10-04 | Discharge status: Against Medical Advice | Payer: Medicare Other | Attending: Emergency Medicine | Admitting: Emergency Medicine

## 2011-10-04 ENCOUNTER — Encounter (HOSPITAL_COMMUNITY): Payer: Self-pay | Admitting: Emergency Medicine

## 2011-10-04 DIAGNOSIS — R109 Unspecified abdominal pain: Secondary | ICD-10-CM | POA: Insufficient documentation

## 2011-10-04 DIAGNOSIS — I10 Essential (primary) hypertension: Secondary | ICD-10-CM | POA: Insufficient documentation

## 2011-10-04 HISTORY — DX: Unspecified osteoarthritis, unspecified site: M19.90

## 2011-10-04 HISTORY — DX: Essential (primary) hypertension: I10

## 2011-10-04 HISTORY — DX: Gastro-esophageal reflux disease without esophagitis: K21.9

## 2011-10-04 HISTORY — DX: Disorder of thyroid, unspecified: E07.9

## 2011-10-04 NOTE — ED Notes (Signed)
Pt c/o "bellyache" since yesterday morning.  Pain has gotten worse.  Denies NVD.  Abd mildly distended.  Has taken tylenol and tums which eases the pain a little bit but doesn't make it go away.  NAD.

## 2011-10-04 NOTE — ED Provider Notes (Signed)
History     CSN: [id number]  Arrival date & time 10/04/11  0518   First MD Initiated Contact with Patient 10/04/11 [phone]      Chief Complaint  Patient presents with  . Abdominal Pain    (Consider location/radiation/quality/duration/timing/severity/associated sxs/prior treatment) HPI Patient presents with complaint of abdominal pain. She states the pain began last night and was located in her lower abdomen as well as her epigastric region. Pain began after drinking a glass of space T. Pain described as intermittent and cramping. She denies any fever or, she's had no vomiting or diarrhea. She denies any blood in her stool or melena. She also denies any dysuria, urgency or frequency. She states that upon my evaluation her pain is mostly improved. There are no alleviating or modifying factors. There no other associated systemic symptoms   Past Medical History  Diagnosis Date  . Hypertension   . Arthritis   . Thyroid disease   . GERD (gastroesophageal reflux disease)     Past Surgical History  Procedure Date  . Joint replacement     No family history on file.  History  Substance Use Topics  . Smoking status: Never Smoker   . Smokeless tobacco: Not on file  . Alcohol Use: No    OB History    Grav Para Term Preterm Abortions TAB SAB Ect Mult Living                  Review of Systems ROS reviewed and otherwise negative except for mentioned in HPI  Allergies  Review of patient's allergies indicates no known allergies.  Home Medications  No current outpatient prescriptions on file.  BP 133/62  Pulse 95  Temp 98.9 F (37.2 C)  Resp 20  Wt 127 lb (57.607 kg)  SpO2 95% Vitals reviewed Physical Exam Physical Examination: General appearance - alert, well appearing, and in no distress Mental status - alert, oriented to person, place, and time Eyes- no scleral icterus  Mouth - mucous membranes moist, pharynx normal without lesions Chest - clear to auscultation, no  wheezes, rales or rhonchi, symmetric air entry Heart - normal rate, regular rhythm, normal S1, S2, no murmurs, rubs, clicks or gallops Abdomen - soft, nontender, nondistended, no masses or organomegaly, NABS Extremities - peripheral pulses normal, no pedal edema, no clubbing or cyanosis Skin - normal coloration and turgor, no rashes  ED Course  Procedures (including critical care time)  Labs Reviewed - No data to display No results found.   1. Abdominal pain       MDM  Patient presenting with diffuse abdominal pain which has been present over the last day. After my examination and patient was reassured and declined to have any further testing done today to the emergency department. She states her pain has improved and she would prefer to followup with her primary Dr. for any further testing. She will be signing out AGAINST MEDICAL ADVICE but have discussed with her that she should return if her symptoms worsen and she was given strict return precautions. She is comfortable with following up with her doctor and will come back to the emergency room if needed.        Ethelda Chick, MD 10/04/11 1031

## 2011-10-04 NOTE — ED Notes (Signed)
Pt alert, nad, c/o gen abd pain, onset yesterday, resp even unlabored, skin pwd, denies n/v, states has hd problems with constipation in the past, abd distended

## 2011-10-05 ENCOUNTER — Emergency Department (HOSPITAL_COMMUNITY): Payer: Medicare Other

## 2011-10-05 ENCOUNTER — Other Ambulatory Visit: Payer: Self-pay

## 2011-10-05 ENCOUNTER — Inpatient Hospital Stay (HOSPITAL_COMMUNITY)
Admission: EM | Admit: 2011-10-05 | Discharge: 2011-10-08 | DRG: 418 | Disposition: A | Discharge status: Home / Self Care | Payer: Medicare Other | Attending: Internal Medicine | Admitting: Internal Medicine

## 2011-10-05 ENCOUNTER — Encounter (HOSPITAL_COMMUNITY): Payer: Self-pay | Admitting: *Deleted

## 2011-10-05 DIAGNOSIS — K859 Acute pancreatitis without necrosis or infection, unspecified: Principal | ICD-10-CM | POA: Diagnosis present

## 2011-10-05 DIAGNOSIS — E039 Hypothyroidism, unspecified: Secondary | ICD-10-CM | POA: Diagnosis present

## 2011-10-05 DIAGNOSIS — K8020 Calculus of gallbladder without cholecystitis without obstruction: Secondary | ICD-10-CM

## 2011-10-05 DIAGNOSIS — E079 Disorder of thyroid, unspecified: Secondary | ICD-10-CM

## 2011-10-05 DIAGNOSIS — R1013 Epigastric pain: Secondary | ICD-10-CM

## 2011-10-05 DIAGNOSIS — K8060 Calculus of gallbladder and bile duct with cholecystitis, unspecified, without obstruction: Secondary | ICD-10-CM | POA: Diagnosis present

## 2011-10-05 DIAGNOSIS — R109 Unspecified abdominal pain: Secondary | ICD-10-CM

## 2011-10-05 DIAGNOSIS — K8064 Calculus of gallbladder and bile duct with chronic cholecystitis without obstruction: Secondary | ICD-10-CM | POA: Diagnosis present

## 2011-10-05 DIAGNOSIS — Z853 Personal history of malignant neoplasm of breast: Secondary | ICD-10-CM

## 2011-10-05 DIAGNOSIS — K8070 Calculus of gallbladder and bile duct without cholecystitis without obstruction: Secondary | ICD-10-CM

## 2011-10-05 DIAGNOSIS — Z96659 Presence of unspecified artificial knee joint: Secondary | ICD-10-CM

## 2011-10-05 DIAGNOSIS — I10 Essential (primary) hypertension: Secondary | ICD-10-CM | POA: Diagnosis present

## 2011-10-05 DIAGNOSIS — K851 Biliary acute pancreatitis without necrosis or infection: Secondary | ICD-10-CM

## 2011-10-05 DIAGNOSIS — D72829 Elevated white blood cell count, unspecified: Secondary | ICD-10-CM | POA: Diagnosis present

## 2011-10-05 DIAGNOSIS — M1990 Unspecified osteoarthritis, unspecified site: Secondary | ICD-10-CM

## 2011-10-05 DIAGNOSIS — K219 Gastro-esophageal reflux disease without esophagitis: Secondary | ICD-10-CM | POA: Diagnosis present

## 2011-10-05 DIAGNOSIS — I498 Other specified cardiac arrhythmias: Secondary | ICD-10-CM | POA: Diagnosis present

## 2011-10-05 DIAGNOSIS — E871 Hypo-osmolality and hyponatremia: Secondary | ICD-10-CM | POA: Diagnosis present

## 2011-10-05 DIAGNOSIS — E876 Hypokalemia: Secondary | ICD-10-CM | POA: Diagnosis not present

## 2011-10-05 HISTORY — DX: Malignant (primary) neoplasm, unspecified: C80.1

## 2011-10-05 HISTORY — DX: Malignant neoplasm of unspecified site of unspecified female breast: C50.919

## 2011-10-05 LAB — COMPREHENSIVE METABOLIC PANEL
Albumin: 3.9 g/dL (ref 3.5–5.2)
Alkaline Phosphatase: 81 U/L (ref 39–117)
BUN: 14 mg/dL (ref 6–23)
Calcium: 10.1 mg/dL (ref 8.4–10.5)
Potassium: 3.4 mEq/L — ABNORMAL LOW (ref 3.5–5.1)
Sodium: 135 mEq/L (ref 135–145)
Total Protein: 7.7 g/dL (ref 6.0–8.3)

## 2011-10-05 LAB — DIFFERENTIAL
Basophils Relative: 0 % (ref 0–1)
Eosinophils Absolute: 0.2 10*3/uL (ref 0.0–0.7)
Neutrophils Relative %: 79 % — ABNORMAL HIGH (ref 43–77)

## 2011-10-05 LAB — POCT I-STAT TROPONIN I

## 2011-10-05 LAB — URINALYSIS, ROUTINE W REFLEX MICROSCOPIC
Bilirubin Urine: NEGATIVE
Ketones, ur: 15 mg/dL — AB
Nitrite: NEGATIVE
Protein, ur: NEGATIVE mg/dL
Urobilinogen, UA: 0.2 mg/dL (ref 0.0–1.0)

## 2011-10-05 LAB — CBC
MCH: 29 pg (ref 26.0–34.0)
MCHC: 32.9 g/dL (ref 30.0–36.0)
Platelets: 253 10*3/uL (ref 150–400)

## 2011-10-05 LAB — LIPASE, BLOOD: Lipase: 1653 U/L — ABNORMAL HIGH (ref 11–59)

## 2011-10-05 LAB — GLUCOSE, CAPILLARY: Glucose-Capillary: 72 mg/dL (ref 70–99)

## 2011-10-05 MED ORDER — IOHEXOL 300 MG/ML  SOLN
100.0000 mL | Freq: Once | INTRAMUSCULAR | Status: AC | PRN
  Administered 2011-10-05: 100 mL via INTRAVENOUS

## 2011-10-05 MED ORDER — ONDANSETRON HCL 4 MG PO TABS: 4.0000 mg | ORAL_TABLET | Freq: Four times a day (QID) | ORAL | Status: DC | PRN

## 2011-10-05 MED ORDER — POTASSIUM CHLORIDE 10 MEQ/100ML IV SOLN
10.0000 meq | INTRAVENOUS | Status: AC
  Administered 2011-10-05 – 2011-10-06 (×2): 10 meq via INTRAVENOUS
  Filled 2011-10-05 (×2): qty 100

## 2011-10-05 MED ORDER — ACETAMINOPHEN 650 MG RE SUPP: 650.0000 mg | Freq: Four times a day (QID) | RECTAL | Status: DC | PRN

## 2011-10-05 MED ORDER — ADULT MULTIVITAMIN W/MINERALS CH
1.0000 | ORAL_TABLET | Freq: Every day | ORAL | Status: DC
  Administered 2011-10-06 – 2011-10-08 (×2): 1 via ORAL
  Filled 2011-10-05 (×3): qty 1

## 2011-10-05 MED ORDER — SODIUM CHLORIDE 0.9 % IV BOLUS (SEPSIS)
600.0000 mL | Freq: Once | INTRAVENOUS | Status: AC
  Administered 2011-10-05: 999 mL via INTRAVENOUS

## 2011-10-05 MED ORDER — SODIUM CHLORIDE 0.9 % IV SOLN
INTRAVENOUS | Status: DC
  Administered 2011-10-05: 18:00:00 via INTRAVENOUS

## 2011-10-05 MED ORDER — LORATADINE 10 MG PO TABS
10.0000 mg | ORAL_TABLET | Freq: Every day | ORAL | Status: DC
  Administered 2011-10-06 – 2011-10-08 (×2): 10 mg via ORAL
  Filled 2011-10-05 (×4): qty 1

## 2011-10-05 MED ORDER — SODIUM CHLORIDE 0.9 % IV SOLN
INTRAVENOUS | Status: DC
  Administered 2011-10-05: 21:00:00 via INTRAVENOUS

## 2011-10-05 MED ORDER — ONDANSETRON HCL 4 MG/2ML IJ SOLN: 4.0000 mg | Freq: Three times a day (TID) | INTRAMUSCULAR | Status: DC | PRN

## 2011-10-05 MED ORDER — PANTOPRAZOLE SODIUM 40 MG IV SOLR
40.0000 mg | Freq: Two times a day (BID) | INTRAVENOUS | Status: DC
  Administered 2011-10-06 – 2011-10-08 (×5): 40 mg via INTRAVENOUS
  Filled 2011-10-05 (×7): qty 40

## 2011-10-05 MED ORDER — MULTIVITAMINS PO TABS: 1.0000 | ORAL_TABLET | Freq: Every day | ORAL | Status: DC

## 2011-10-05 MED ORDER — ACETAMINOPHEN 325 MG PO TABS: 650.0000 mg | ORAL_TABLET | Freq: Four times a day (QID) | ORAL | Status: DC | PRN

## 2011-10-05 MED ORDER — ONDANSETRON HCL 4 MG/2ML IJ SOLN: 4.0000 mg | Freq: Four times a day (QID) | INTRAMUSCULAR | Status: DC | PRN

## 2011-10-05 MED ORDER — VITAMIN D3 125 MCG (5000 UT) PO CAPS: 5000.0000 [IU] | ORAL_CAPSULE | Freq: Every day | ORAL | Status: DC

## 2011-10-05 MED ORDER — VITAMIN D3 25 MCG (1000 UNIT) PO TABS
5000.0000 [IU] | ORAL_TABLET | Freq: Every day | ORAL | Status: DC
  Administered 2011-10-06 – 2011-10-08 (×2): 5000 [IU] via ORAL
  Filled 2011-10-05 (×5): qty 5

## 2011-10-05 MED ORDER — ONDANSETRON HCL 4 MG/2ML IJ SOLN
4.0000 mg | Freq: Four times a day (QID) | INTRAMUSCULAR | Status: DC | PRN
Start: 2011-10-05 — End: 2011-10-05
  Administered 2011-10-05: 4 mg via INTRAVENOUS
  Filled 2011-10-05: qty 2

## 2011-10-05 MED ORDER — LEVOTHYROXINE SODIUM 100 MCG PO TABS
100.0000 ug | ORAL_TABLET | ORAL | Status: DC
  Administered 2011-10-06: 100 ug via ORAL
  Filled 2011-10-05: qty 1

## 2011-10-05 MED ORDER — MORPHINE SULFATE 2 MG/ML IJ SOLN: 1.0000 mg | INTRAMUSCULAR | Status: DC | PRN

## 2011-10-05 MED ORDER — SODIUM CHLORIDE 0.9 % IV SOLN
INTRAVENOUS | Status: DC
  Administered 2011-10-05: 1000 mL via INTRAVENOUS
  Administered 2011-10-05 – 2011-10-07 (×3): via INTRAVENOUS

## 2011-10-05 MED ORDER — METOPROLOL TARTRATE 12.5 MG HALF TABLET
12.5000 mg | ORAL_TABLET | Freq: Every evening | ORAL | Status: DC
  Administered 2011-10-06 – 2011-10-07 (×2): 12.5 mg via ORAL
  Filled 2011-10-05 (×4): qty 1

## 2011-10-05 MED ORDER — ENOXAPARIN SODIUM 40 MG/0.4ML ~~LOC~~ SOLN
40.0000 mg | SUBCUTANEOUS | Status: DC
  Administered 2011-10-06 – 2011-10-07 (×2): 40 mg via SUBCUTANEOUS
  Filled 2011-10-05 (×3): qty 0.4

## 2011-10-05 MED ORDER — MORPHINE SULFATE 2 MG/ML IJ SOLN
2.0000 mg | Freq: Once | INTRAMUSCULAR | Status: AC
  Administered 2011-10-05: 2 mg via INTRAVENOUS
  Filled 2011-10-05: qty 1

## 2011-10-05 MED ORDER — SODIUM CHLORIDE 0.9 % IV SOLN
3.0000 g | Freq: Once | INTRAVENOUS | Status: AC
  Administered 2011-10-05: 3 g via INTRAVENOUS
  Filled 2011-10-05: qty 3

## 2011-10-05 MED ORDER — LEVOTHYROXINE SODIUM 50 MCG PO TABS
50.0000 ug | ORAL_TABLET | ORAL | Status: DC
  Administered 2011-10-07 – 2011-10-08 (×2): 50 ug via ORAL
  Filled 2011-10-05 (×3): qty 1

## 2011-10-05 NOTE — ED Notes (Signed)
drinkigf oral contrast

## 2011-10-05 NOTE — ED Notes (Signed)
Patient transported to X-ray 

## 2011-10-05 NOTE — ED Notes (Signed)
drinkimg oral contrast

## 2011-10-05 NOTE — ED Notes (Signed)
EDP Devoria Albe made aware of pt request for pain medication

## 2011-10-05 NOTE — H&P (Signed)
Patient's PCP: [name], MD, MD  Chief Complaint: Abdominal pain  History of Present Illness: [name] is a 78 y.o. Caucasian female with history of hypertension, hypothyroidism, history of breast cancer status post chemotherapy in early 2000s, GERD, who presented with the above complaints.  Symptoms initially started on 10/03/2011 in the afternoon after having [employer] tea and light lunch.  She started having sharp epigastric pain, which since then has improved but patient still has epigastric soreness.  She had presented to the emergency department yesterday for further evaluation however her left before any evaluation could be performed as she thought that she could follow up with her primary care physician.  She had persistent abdominal soreness initially went to primary care physician's office who were busy to work the patient in their clinic as a result she came back to the emergency department and on evaluation was found to have elevated lipase suggestive of pancreatitis an abdominal ultrasound showed choledocholithiasis with common bile duct, intrahepatic duct, and pancreatic duct dilatation.  General surgery has evaluated the patient and requested the patient be medically admitted for further management and with the plan of laparoscopic cholecystectomy prior to discharge.  Patient reported that she was warm this morning, denies any chills, chest pain, shortness of breath, diarrhea, nausea, vomiting, headaches or vision changes.  Meds: Scheduled Meds:   . sodium chloride   Intravenous STAT  . ampicillin-sulbactam (UNASYN) IV  3 g Intravenous Once  .  morphine injection  2 mg Intravenous Once  . potassium chloride  10 mEq Intravenous Q1 Hr x 2  . sodium chloride  600 mL Intravenous Once   Continuous Infusions:   . sodium chloride    . DISCONTD: sodium chloride 100 mL/hr at 10/05/11 1742   PRN Meds:.iohexol, ondansetron, ondansetron (ZOFRAN) IV Allergies: Review of  patient's allergies indicates no known allergies. Past Medical History  Diagnosis Date  . Hypertension   . Arthritis   . Thyroid disease   . GERD (gastroesophageal reflux disease)   . Cancer     breast   Past Surgical History  Procedure Date  . Joint replacement     right knee  . Carpal tunnel release     bilateral  . Incontinence surgery   . Cataract extraction     left  . Mastectomy     right due to cancer   Family History  Problem Relation Age of Onset  . Stroke Mother   . Breast cancer Mother   . Heart disease Mother   . Stroke Father   . Heart disease Father   . Breast cancer Daughter    History   Social History  . Marital Status: Widowed    Spouse Name: N/A    Number of Children: N/A  . Years of Education: N/A   Occupational History  . Not on file.   Social History Main Topics  . Smoking status: Former Games developer  . Smokeless tobacco: Never Used   Comment: Quit 50 years ago.  . Alcohol Use: Yes     Twice a year.  . Drug Use: No  . Sexually Active:    Other Topics Concern  . Not on file   Social History Narrative  . No narrative on file   Review of Systems: All systems reviewed with the patient and positive as per history of present illness, otherwise all other systems are negative.  Physical Exam: Blood pressure 113/55, pulse 97, temperature 98.9 F (37.2 C), temperature source Oral,  resp. rate 14, weight 57.607 kg (127 lb), SpO2 90.00%. General: Awake, Oriented x3, No acute distress. HEENT: EOMI, Moist mucous membranes Neck: Supple CV: S1 and S2 Lungs: Clear to ascultation bilaterally Abdomen: Soft, some mild tenderness in the right upper quadrant, no guarding or rebound tenderness, Nondistended, +bowel sounds. Ext: Good pulses. Trace edema. No clubbing or cyanosis noted. Neuro: Cranial Nerves II-XII grossly intact. Has 5/5 motor strength in upper and lower extremities.   Lab results:  [facility] 10/05/11 1205  NA 135  K 3.4*  CL 97  CO2  27  GLUCOSE 79  BUN 14  CREATININE 0.79  CALCIUM 10.1  MG --  PHOS --    Basename 10/05/11 1205  AST 22  ALT 13  ALKPHOS 81  BILITOT 0.5  PROT 7.7  ALBUMIN 3.9    Basename 10/05/11 1205  LIPASE 1653*  AMYLASE --    Basename 10/05/11 1205  WBC 15.3*  NEUTROABS 12.2*  HGB 12.9  HCT 39.2  MCV 88.1  PLT 253   No results found for this basename: CKTOTAL:3,CKMB:3,CKMBINDEX:3,TROPONINI:3 in the last 72 hours No components found with this basename: POCBNP:3 No results found for this basename: DDIMER in the last 72 hours No results found for this basename: HGBA1C:2 in the last 72 hours No results found for this basename: CHOL:2,HDL:2,LDLCALC:2,TRIG:2,CHOLHDL:2,LDLDIRECT:2 in the last 72 hours No results found for this basename: TSH,T4TOTAL,FREET3,T3FREE,THYROIDAB in the last 72 hours No results found for this basename: VITAMINB12:2,FOLATE:2,FERRITIN:2,TIBC:2,IRON:2,RETICCTPCT:2 in the last 72 hours Imaging results:  US Abdomen Complete  10/05/2011  The *RADIOLOGY REPORT*  Clinical Data:  Abdominal pain  ABDOMINAL ULTRASOUND COMPLETE  Comparison:  None.  Findings:  Gallbladder:  There are multiple mobile gallstones within the gallbladder lumen.  The largest measures approximately 1.3 cm. No gallbladder wall thickening or pericholecystic fluid. Negative sonographic Murphy's sign.  Common Bile Duct:   Dilated, measuring 11 millimeters. There is an 9 mm stone in the distal portion of the duct.  Liver: No focal mass lesion identified.  Within normal limits in parenchymal echogenicity. There is intrahepatic biliary ductal dilatation.  IVC:  Appears normal.  Pancreas:  There is mild pancreatic ductal dilatation.  Spleen:  Within normal limits in size and echotexture.  Right kidney:  Normal in size and parenchymal echogenicity.  No evidence of mass or hydronephrosis.  Left kidney:  Normal in size and parenchymal echogenicity.  No evidence of mass or hydronephrosis.  Abdominal Aorta:  No  aneurysm identified.  A tiny right pleural effusion is noted.  IMPRESSION:  There is choledocholithiasis  with resultant common duct, intrahepatic duct, and pancreatic ductal dilatation.  Original Report Authenticated By: Brandon Melnick, M.D.   Ct Abdomen Pelvis W Contrast  10/05/2011  *RADIOLOGY REPORT*  Clinical Data: Abdominal pain.  CT ABDOMEN AND PELVIS WITH CONTRAST  Technique:  Multidetector CT imaging of the abdomen and pelvis was performed following the standard protocol during bolus administration of intravenous contrast.  Contrast: OMNIPAQUE IOHEXOL 300 MG/ML IJ SOLN  Comparison: Radiographs dated 10/05/2011  Findings: There is dilatation of the bile ducts and of the pancreatic duct.  Common bile duct is at least 10 mm in diameter and the pancreatic duct is 10 mm in diameter.  There is no discrete definitive stone in the distal ducts there is no definitive mass of concern at the may be a stone in the distal common bile duct or a stricture or mass at the ampulla.  There is a duodenal diverticulum adjacent to the ampulla.  The gallbladder is distended.  There is slight dilatation of intrahepatic bile ducts.  Liver parenchyma is otherwise normal.  Spleen is normal.  Pancreas demonstrates diffuse dilatation of the duct without a mass.  Adrenal glands and kidneys are normal.  Terminal ileum and appendix are normal.  Uterus and ovaries are normal.  Pessary in place.  No acute osseous abnormality.  Degenerative rotoscoliosis of the lumbar spine.  IMPRESSION: Dilated pancreatic and bile ducts worrisome for distal ductal obstruction which could be due to a distal stone, stricture or mass.  No other significant abnormalities.  Original Report Authenticated By: Gwynn Burly, M.D.   Dg Abd Acute W/chest  10/05/2011  *RADIOLOGY REPORT*  Clinical Data: Abdominal pain  ACUTE ABDOMEN SERIES (ABDOMEN 2 VIEW & CHEST 1 VIEW)  Comparison: March 30, 2011 chest x-ray  Findings: The cardiac silhouette,  mediastinum, pulmonary vasculature are within normal limits.  Both lungs are clear.  The stool and bowel gas pattern is within normal limits.  There is a moderate stool burden in the rectosigmoid colon, as well as the descending colon.  No pneumoperitoneum.  There is no gross organomegaly or abnormal calcifications.  There is left convex lumbar scoliosis and degenerative changes within the lumbar spine. There is no acute bony abnormality.  IMPRESSION: Negative chest.  Normal stool and bowel gas pattern with moderate stool burden.  Original Report Authenticated By: Brandon Melnick, M.D.   Other results: EKG: there are no previous tracings available for comparison, sinus tachycardia.  Assessment & Plan by Problem: 1. Epigastric abdominal pain, likely due to gallstone pancreatitis.  Surgery has evaluated the patient and is planning on performing laparoscopic cholecystectomy prior to discharge.  Patient has had CT of abdomen pelvis and abdominal ultrasound with results as indicated above.  Will have the patient n.p.o. Aggressively hydrate the patient on IV fluids for supportive care.  Trend liver function tests.  Trend lipase were improving.  As needed pain medications for symptomatic control.  Discussed with Dr. Rhea Belton, GI, who will plan on evaluating the patient tomorrow for possible ERCP.  Patient given a dose of Unasyn in ER, no indications for continued antibiotics at this time.  Will send lipid panel in the morning.  2.  Hypokalemia.  Replace as needed.  3.  Leukocytosis.  Likely reactive.  Continue to monitor.  Urine analysis not suggestive for urinary tract infection.  4.  Hypothyroidism.  Continue Synthroid.  5.  GERD.  Stable at this time.  6.  Sinus tachycardia, patient reports that she has a history of this prolonged time.  Continue metoprolol, with holding parameters.  Pain may be contributing to her tachycardia.  7.  Hypertension.  Stable continue metoprolol.  Hold hydrochlorothiazide for  now.  8.  Prophylaxis.  Lovenox, hold on 02/29/2013 for any GI procedure.  Continue IV PPI.  9.  CODE STATUS.  Full code, this was discussed with patient and son at the time of admission.  Time spent on admission, talking to the patient, and coordinating care was: 60 mins.  Janellie Tennison A, MD 10/05/2011, 7:41 PM

## 2011-10-05 NOTE — ED Provider Notes (Signed)
5:16 PM Case was discussed with [place] surgery. Are recommending medicine admit with a GI consult as well.  5:29 PM Discuss case with gastroenterology, Dr. Velora Heckler. Will see patient in consult in the morning.  Raeford Razor, MD 10/07/11 [phone]

## 2011-10-05 NOTE — ED Notes (Addendum)
While pt is checking in, pt reports that she was seen here yesterday for same, states that she came back because she started to have a fever today.  Pt reports she waited 2 hours before seeing an EDP and states that she left AMA.  Pt reports having a bad experience and has had a bad experience the last 2 visits.  Pt states "no one came in my room, they came in one time and shut the door and i never saw them again.  They were busy sitting there with their computers.  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

## 2011-10-05 NOTE — ED Notes (Signed)
Pt reports "last BM was Tuesday morning"

## 2011-10-05 NOTE — ED Provider Notes (Signed)
History     CSN: [id number]  Arrival date & time 10/05/11  1007   First MD Initiated Contact with Patient 10/05/11 1104      Chief Complaint  Patient presents with  . Abdominal Pain    (Consider location/radiation/quality/duration/timing/severity/associated sxs/prior treatment) HPI  Patient relates to afternoons ago about 2:30 PM she was drinking oral gray tea and had acute onset of diffuse abdominal burning and pain. She denies nausea, vomiting, diarrhea, fever, or constipation. She states she came to the ER yesterday and left. She states last night she went to sleep however she was awakened at 1:30 AM with discomfort. She states she ate about 1:30 and the discomfort got worse. She states she has a diffuse burning feeling and soreness now she states it hurts more when she breathes deep and it feels better if she lays still. She states she's never had this before.  PCP Dr. Oneta Rack  Past Medical History  Diagnosis Date  . Hypertension   . Arthritis   . Thyroid disease   . GERD (gastroesophageal reflux disease)   . Cancer     breast    Past Surgical History  Procedure Date  . Joint replacement     right knee  . Carpal tunnel release     bilateral  . Incontinence surgery   . Cataract extraction     left  . Mastectomy     right due to cancer   right total knee replacement in September by Dr. Cleophas Dunker  Family History  Problem Relation Age of Onset  . Stroke Mother   . Breast cancer Mother   . Heart disease Mother   . Stroke Father   . Heart disease Father   . Breast cancer Daughter     History  Substance Use Topics  . Smoking status: Former Games developer  . Smokeless tobacco: Never Used   Comment: Quit 50 years ago.  . Alcohol Use: Yes     Twice a year.   lives alone  OB History    Grav Para Term Preterm Abortions TAB SAB Ect Mult Living                  Review of Systems  All other systems reviewed and are negative.    Allergies  Review of patient's  allergies indicates no known allergies.  Home Medications   Current Outpatient Rx  Name Route Sig Dispense Refill  . CETIRIZINE HCL 10 MG PO TABS Oral Take 10 mg by mouth daily.    Marland Kitchen VITAMIN D3 5000 UNITS PO CAPS Oral Take 5,000 Units by mouth daily.    Marland Kitchen ESTRADIOL 2 MG VA RING Vaginal Place 2 mg vaginally every 3 (three) months. follow package directions    . HYDROCHLOROTHIAZIDE 25 MG PO TABS Oral Take 25 mg by mouth daily as needed. fluid    . LEVOTHYROXINE SODIUM 100 MCG PO TABS Oral Take 50-100 mcg by mouth See admin instructions. Pt takes 1/2 tab for 50 mcg dose on Tuesday,thursday,saturday,sunday. 100 mcg  On Monday, Wednesday,friday    . MAGNESIUM 250 MG PO TABS Oral Take 250 mg by mouth every morning.    Marland Kitchen METOPROLOL TARTRATE 25 MG PO TABS Oral Take 12.5 mg by mouth every evening. Pt takes 1/2 tablet of 25 mg  for 12.5 mg dose    . MULTIVITAMINS PO TABS Oral Take 1 tablet by mouth daily.    Marland Kitchen PHENYLEPHRINE COMPLEX PO Oral Take 10 mg by mouth every morning.  BP 113/55  Pulse 95  Temp 99 F (37.2 C)  Resp 14  Wt 127 lb (57.607 kg)  SpO2 93%  Vital signs normal except  borderline fever   Physical Exam  Nursing note and vitals reviewed. Constitutional: She is oriented to person, place, and time. She appears well-developed and well-nourished.  Non-toxic appearance. She does not appear ill. No distress.  HENT:  Head: Normocephalic and atraumatic.  Right Ear: External ear normal.  Left Ear: External ear normal.  Nose: Nose normal. No mucosal edema or rhinorrhea.  Mouth/Throat: Mucous membranes are normal. No dental abscesses or uvula swelling.       Tongue dry  Eyes: Conjunctivae and EOM are normal. Pupils are equal, round, and reactive to light.  Neck: Normal range of motion and full passive range of motion without pain. Neck supple.  Cardiovascular: Normal rate, regular rhythm and normal heart sounds.  Exam reveals no gallop and no friction rub.   No murmur  heard. Pulmonary/Chest: Effort normal and breath sounds normal. No respiratory distress. She has no wheezes. She has no rhonchi. She has no rales. She exhibits no tenderness and no crepitus.  Abdominal: Soft. Normal appearance and bowel sounds are normal. She exhibits no distension. There is tenderness. There is no rebound and no guarding.       When I examine the patient she states she has mild discomfort diffusely and seemed to have consistently a tender spot in her right upper quadrant where she would have a involuntary twitching but when I questioned her about it she states it was not more tender than the rest of the abdomen and then when I reexamined that she was no longer reactive.  Musculoskeletal: Normal range of motion. She exhibits no edema and no tenderness.       Moves all extremities well.   Neurological: She is alert and oriented to person, place, and time. She has normal strength. No cranial nerve deficit.  Skin: Skin is warm, dry and intact. No rash noted. No erythema. No pallor.  Psychiatric: She has a normal mood and affect. Her speech is normal and behavior is normal. Her mood appears not anxious.    ED Course  Procedures (including critical care time)  Patient initially refused any pain medicine. However she did change her mind. Patient states she's very sensitive to pain medicine only wanted a low dose of something. She was given morphine 2 mg IV which she states has helped with her pain.  After reviewing patient's CT scan she was given Unasyn 3 g IV. Patient was informed of her CT results and need for ultrasound. She also understands she will need to be admitted.  PT turned over at change of shift waiting for surgery to call back, Dr Juleen China will take the call.  Results for orders placed during the hospital encounter of 10/05/11  GLUCOSE, CAPILLARY      Component Value Range   Glucose-Capillary 72  70 - 99 (mg/dL)  URINALYSIS, ROUTINE W REFLEX MICROSCOPIC      Component  Value Range   Color, Urine YELLOW  YELLOW    APPearance CLOUDY (*) CLEAR    Specific Gravity, Urine 1.015  1.005 - 1.030    pH 7.5  5.0 - 8.0    Glucose, UA NEGATIVE  NEGATIVE (mg/dL)   Hgb urine dipstick NEGATIVE  NEGATIVE    Bilirubin Urine NEGATIVE  NEGATIVE    Ketones, ur 15 (*) NEGATIVE (mg/dL)   Protein, ur NEGATIVE  NEGATIVE (  mg/dL)   Urobilinogen, UA 0.2  0.0 - 1.0 (mg/dL)   Nitrite NEGATIVE  NEGATIVE    Leukocytes, UA NEGATIVE  NEGATIVE   CBC      Component Value Range   WBC 15.3 (*) 4.0 - 10.5 (K/uL)   RBC 4.45  3.87 - 5.11 (MIL/uL)   Hemoglobin 12.9  12.0 - 15.0 (g/dL)   HCT 09.8  11.9 - 14.7 (%)   MCV 88.1  78.0 - 100.0 (fL)   MCH 29.0  26.0 - 34.0 (pg)   MCHC 32.9  30.0 - 36.0 (g/dL)   RDW 82.9 (*) 56.2 - 15.5 (%)   Platelets 253  150 - 400 (K/uL)  DIFFERENTIAL      Component Value Range   Neutrophils Relative 79 (*) 43 - 77 (%)   Neutro Abs 12.2 (*) 1.7 - 7.7 (K/uL)   Lymphocytes Relative 10 (*) 12 - 46 (%)   Lymphs Abs 1.6  0.7 - 4.0 (K/uL)   Monocytes Relative 9  3 - 12 (%)   Monocytes Absolute 1.4 (*) 0.1 - 1.0 (K/uL)   Eosinophils Relative 1  0 - 5 (%)   Eosinophils Absolute 0.2  0.0 - 0.7 (K/uL)   Basophils Relative 0  0 - 1 (%)   Basophils Absolute 0.0  0.0 - 0.1 (K/uL)  COMPREHENSIVE METABOLIC PANEL      Component Value Range   Sodium 135  135 - 145 (mEq/L)   Potassium 3.4 (*) 3.5 - 5.1 (mEq/L)   Chloride 97  96 - 112 (mEq/L)   CO2 27  19 - 32 (mEq/L)   Glucose, Bld 79  70 - 99 (mg/dL)   BUN 14  6 - 23 (mg/dL)   Creatinine, Ser 1.30  0.50 - 1.10 (mg/dL)   Calcium 86.5  8.4 - 10.5 (mg/dL)   Total Protein 7.7  6.0 - 8.3 (g/dL)   Albumin 3.9  3.5 - 5.2 (g/dL)   AST 22  0 - 37 (U/L)   ALT 13  0 - 35 (U/L)   Alkaline Phosphatase 81  39 - 117 (U/L)   Total Bilirubin 0.5  0.3 - 1.2 (mg/dL)   GFR calc non Af Amer 76 (*) >90 (mL/min)   GFR calc Af Amer 88 (*) >90 (mL/min)  LIPASE, BLOOD      Component Value Range   Lipase 1653 (*) 11 - 59 (U/L)   POCT I-STAT TROPONIN I      Component Value Range   Troponin i, poc 0.01  0.00 - 0.08 (ng/mL)   Comment 3            Laboratory interpretation all normal except leukocytosis, very elevated lipase    US Abdomen Complete  10/05/2011  The *RADIOLOGY REPORT*  Clinical Data:  Abdominal pain  ABDOMINAL ULTRASOUND COMPLETE  Comparison:  None.  Findings:  Gallbladder:  There are multiple mobile gallstones within the gallbladder lumen.  The largest measures approximately 1.3 cm. No gallbladder wall thickening or pericholecystic fluid. Negative sonographic Murphy's sign.  Common Bile Duct:   Dilated, measuring 11 millimeters. There is an 9 mm stone in the distal portion of the duct.  Liver: No focal mass lesion identified.  Within normal limits in parenchymal echogenicity. There is intrahepatic biliary ductal dilatation.  IVC:  Appears normal.  Pancreas:  There is mild pancreatic ductal dilatation.  Spleen:  Within normal limits in size and echotexture.  Right kidney:  Normal in size and parenchymal echogenicity.  No evidence of mass  or hydronephrosis.  Left kidney:  Normal in size and parenchymal echogenicity.  No evidence of mass or hydronephrosis.  Abdominal Aorta:  No aneurysm identified.  A tiny right pleural effusion is noted.  IMPRESSION:  There is choledocholithiasis  with resultant common duct, intrahepatic duct, and pancreatic ductal dilatation.  Original Report Authenticated By: Brandon Melnick, M.D.   Ct Abdomen Pelvis W Contrast  10/05/2011  *RADIOLOGY REPORT*  Clinical Data: Abdominal pain.  CT ABDOMEN AND PELVIS WITH CONTRAST  Technique:  Multidetector CT imaging of the abdomen and pelvis was performed following the standard protocol during bolus administration of intravenous contrast.  Contrast: OMNIPAQUE IOHEXOL 300 MG/ML IJ SOLN  Comparison: Radiographs dated 10/05/2011  Findings: There is dilatation of the bile ducts and of the pancreatic duct.  Common bile duct is at least 10 mm in  diameter and the pancreatic duct is 10 mm in diameter.  There is no discrete definitive stone in the distal ducts there is no definitive mass of concern at the may be a stone in the distal common bile duct or a stricture or mass at the ampulla.  There is a duodenal diverticulum adjacent to the ampulla.  The gallbladder is distended.  There is slight dilatation of intrahepatic bile ducts.  Liver parenchyma is otherwise normal.  Spleen is normal.  Pancreas demonstrates diffuse dilatation of the duct without a mass.  Adrenal glands and kidneys are normal.  Terminal ileum and appendix are normal.  Uterus and ovaries are normal.  Pessary in place.  No acute osseous abnormality.  Degenerative rotoscoliosis of the lumbar spine.  IMPRESSION: Dilated pancreatic and bile ducts worrisome for distal ductal obstruction which could be due to a distal stone, stricture or mass.  No other significant abnormalities.  Original Report Authenticated By: Gwynn Burly, M.D.   Dg Abd Acute W/chest  10/05/2011  *RADIOLOGY REPORT*  Clinical Data: Abdominal pain  ACUTE ABDOMEN SERIES (ABDOMEN 2 VIEW & CHEST 1 VIEW)  Comparison: March 30, 2011 chest x-ray  Findings: The cardiac silhouette, mediastinum, pulmonary vasculature are within normal limits.  Both lungs are clear.  The stool and bowel gas pattern is within normal limits.  There is a moderate stool burden in the rectosigmoid colon, as well as the descending colon.  No pneumoperitoneum.  There is no gross organomegaly or abnormal calcifications.  There is left convex lumbar scoliosis and degenerative changes within the lumbar spine. There is no acute bony abnormality.  IMPRESSION: Negative chest.  Normal stool and bowel gas pattern with moderate stool burden.  Original Report Authenticated By: Brandon Melnick, M.D.      Date: 10/05/2011  Rate: 68  Rhythm: normal sinus rhythm  QRS Axis: normal  Intervals: normal  ST/T Wave abnormalities: normal  Conduction  Disutrbances:none  Narrative Interpretation: Q waves septally  Old EKG Reviewed: unchanged from 01/06/2004     1. Abdominal pain   2. Pancreatitis   3. Hypertension   4. Thyroid disease   5. GERD (gastroesophageal reflux disease)   6. Arthritis   7. Gallstones     Plan admission, Dr Juleen China to make calls  Devoria Albe, MD, FACEP   MDM           Ward Givens, MD 10/05/11 [phone]

## 2011-10-05 NOTE — ED Notes (Signed)
Pt transported to US. Husband at bedside  

## 2011-10-05 NOTE — ED Notes (Signed)
Pt states "my stomach started burning & hurting after drinking a glass of Earl Grey tea, I took a couple of tylenol, laid down & went to sleep, I came yesterday morning here around 0500 and didn't see anyone until 0715, so I left, I was just in too much pain."

## 2011-10-05 NOTE — Consult Note (Signed)
Reason for Consult:ab pain Referring Physician: Dr. Ron Agee is an 78 y.o. female.   HPI: This is an 85 yof who is otherwise fairly healthy who on Tuesday developed epigastric pain that ended up being diffuse.  This has been intermittent since then but became worse again today.  She has no nausea/vomiting. No prior history of this.  She denies fevers.  She is passing flatus but has not had a bm.  This is aggravated by movement,palpation and eating.  Relieved by lying down.  Past Medical History  Diagnosis Date  . Hypertension   . Arthritis   . Thyroid disease   . GERD (gastroesophageal reflux disease)   History of breast cancer  Past Surgical History  Procedure Date  . Joint replacement   right mastectomy Bilateral carpal tunnel surgery Bladder suspension  No family history on file.  Social History:  reports that she has never smoked. She does not have any smokeless tobacco history on file. She reports that she does not drink alcohol. Her drug history not on file.  Allergies: No Known Allergies  Meds: see chart  Results for orders placed during the hospital encounter of 10/05/11 (from the past 48 hour(s))  GLUCOSE, CAPILLARY     Status: Normal   Collection Time   10/05/11 11:17 AM      Component Value Range Comment   Glucose-Capillary 72  70 - 99 (mg/dL)   URINALYSIS, ROUTINE W REFLEX MICROSCOPIC     Status: Abnormal   Collection Time   10/05/11 12:01 PM      Component Value Range Comment   Color, Urine YELLOW  YELLOW     APPearance CLOUDY (*) CLEAR     Specific Gravity, Urine 1.015  1.005 - 1.030     pH 7.5  5.0 - 8.0     Glucose, UA NEGATIVE  NEGATIVE (mg/dL)    Hgb urine dipstick NEGATIVE  NEGATIVE     Bilirubin Urine NEGATIVE  NEGATIVE     Ketones, ur 15 (*) NEGATIVE (mg/dL)    Protein, ur NEGATIVE  NEGATIVE (mg/dL)    Urobilinogen, UA 0.2  0.0 - 1.0 (mg/dL)    Nitrite NEGATIVE  NEGATIVE     Leukocytes, UA NEGATIVE  NEGATIVE  MICROSCOPIC NOT DONE ON  URINES WITH NEGATIVE PROTEIN, BLOOD, LEUKOCYTES, NITRITE, OR GLUCOSE <1000 mg/dL.  CBC     Status: Abnormal   Collection Time   10/05/11 12:05 PM      Component Value Range Comment   WBC 15.3 (*) 4.0 - 10.5 (K/uL)    RBC 4.45  3.87 - 5.11 (MIL/uL)    Hemoglobin 12.9  12.0 - 15.0 (g/dL)    HCT 96.0  45.4 - 09.8 (%)    MCV 88.1  78.0 - 100.0 (fL)    MCH 29.0  26.0 - 34.0 (pg)    MCHC 32.9  30.0 - 36.0 (g/dL)    RDW 11.9 (*) 14.7 - 15.5 (%)    Platelets 253  150 - 400 (K/uL)   DIFFERENTIAL     Status: Abnormal   Collection Time   10/05/11 12:05 PM      Component Value Range Comment   Neutrophils Relative 79 (*) 43 - 77 (%)    Neutro Abs 12.2 (*) 1.7 - 7.7 (K/uL)    Lymphocytes Relative 10 (*) 12 - 46 (%)    Lymphs Abs 1.6  0.7 - 4.0 (K/uL)    Monocytes Relative 9  3 - 12 (%)  Monocytes Absolute 1.4 (*) 0.1 - 1.0 (K/uL)    Eosinophils Relative 1  0 - 5 (%)    Eosinophils Absolute 0.2  0.0 - 0.7 (K/uL)    Basophils Relative 0  0 - 1 (%)    Basophils Absolute 0.0  0.0 - 0.1 (K/uL)   COMPREHENSIVE METABOLIC PANEL     Status: Abnormal   Collection Time   10/05/11 12:05 PM      Component Value Range Comment   Sodium 135  135 - 145 (mEq/L)    Potassium 3.4 (*) 3.5 - 5.1 (mEq/L)    Chloride 97  96 - 112 (mEq/L)    CO2 27  19 - 32 (mEq/L)    Glucose, Bld 79  70 - 99 (mg/dL)    BUN 14  6 - 23 (mg/dL)    Creatinine, Ser 1.61  0.50 - 1.10 (mg/dL)    Calcium 09.6  8.4 - 10.5 (mg/dL)    Total Protein 7.7  6.0 - 8.3 (g/dL)    Albumin 3.9  3.5 - 5.2 (g/dL)    AST 22  0 - 37 (U/L)    ALT 13  0 - 35 (U/L)    Alkaline Phosphatase 81  39 - 117 (U/L)    Total Bilirubin 0.5  0.3 - 1.2 (mg/dL)    GFR calc non Af Amer 76 (*) >90 (mL/min)    GFR calc Af Amer 88 (*) >90 (mL/min)   LIPASE, BLOOD     Status: Abnormal   Collection Time   10/05/11 12:05 PM      Component Value Range Comment   Lipase 1653 (*) 11 - 59 (U/L)   POCT I-STAT TROPONIN I     Status: Normal   Collection Time   10/05/11  12:05 PM      Component Value Range Comment   Troponin i, poc 0.01  0.00 - 0.08 (ng/mL)    Comment 3              US Abdomen Complete  10/05/2011  The *RADIOLOGY REPORT*  Clinical Data:  Abdominal pain  ABDOMINAL ULTRASOUND COMPLETE  Comparison:  None.  Findings:  Gallbladder:  There are multiple mobile gallstones within the gallbladder lumen.  The largest measures approximately 1.3 cm. No gallbladder wall thickening or pericholecystic fluid. Negative sonographic Murphy's sign.  Common Bile Duct:   Dilated, measuring 11 millimeters. There is an 9 mm stone in the distal portion of the duct.  Liver: No focal mass lesion identified.  Within normal limits in parenchymal echogenicity. There is intrahepatic biliary ductal dilatation.  IVC:  Appears normal.  Pancreas:  There is mild pancreatic ductal dilatation.  Spleen:  Within normal limits in size and echotexture.  Right kidney:  Normal in size and parenchymal echogenicity.  No evidence of mass or hydronephrosis.  Left kidney:  Normal in size and parenchymal echogenicity.  No evidence of mass or hydronephrosis.  Abdominal Aorta:  No aneurysm identified.  A tiny right pleural effusion is noted.  IMPRESSION:  There is choledocholithiasis  with resultant common duct, intrahepatic duct, and pancreatic ductal dilatation.  Original Report Authenticated By: Brandon Melnick, M.D.   Ct Abdomen Pelvis W Contrast  10/05/2011  *RADIOLOGY REPORT*  Clinical Data: Abdominal pain.  CT ABDOMEN AND PELVIS WITH CONTRAST  Technique:  Multidetector CT imaging of the abdomen and pelvis was performed following the standard protocol during bolus administration of intravenous contrast.  Contrast: OMNIPAQUE IOHEXOL 300 MG/ML IJ SOLN  Comparison:  Radiographs dated 10/05/2011  Findings: There is dilatation of the bile ducts and of the pancreatic duct.  Common bile duct is at least 10 mm in diameter and the pancreatic duct is 10 mm in diameter.  There is no discrete definitive stone  in the distal ducts there is no definitive mass of concern at the may be a stone in the distal common bile duct or a stricture or mass at the ampulla.  There is a duodenal diverticulum adjacent to the ampulla.  The gallbladder is distended.  There is slight dilatation of intrahepatic bile ducts.  Liver parenchyma is otherwise normal.  Spleen is normal.  Pancreas demonstrates diffuse dilatation of the duct without a mass.  Adrenal glands and kidneys are normal.  Terminal ileum and appendix are normal.  Uterus and ovaries are normal.  Pessary in place.  No acute osseous abnormality.  Degenerative rotoscoliosis of the lumbar spine.  IMPRESSION: Dilated pancreatic and bile ducts worrisome for distal ductal obstruction which could be due to a distal stone, stricture or mass.  No other significant abnormalities.  Original Report Authenticated By: Gwynn Burly, M.D.   Dg Abd Acute W/chest  10/05/2011  *RADIOLOGY REPORT*  Clinical Data: Abdominal pain  ACUTE ABDOMEN SERIES (ABDOMEN 2 VIEW & CHEST 1 VIEW)  Comparison: March 30, 2011 chest x-ray  Findings: The cardiac silhouette, mediastinum, pulmonary vasculature are within normal limits.  Both lungs are clear.  The stool and bowel gas pattern is within normal limits.  There is a moderate stool burden in the rectosigmoid colon, as well as the descending colon.  No pneumoperitoneum.  There is no gross organomegaly or abnormal calcifications.  There is left convex lumbar scoliosis and degenerative changes within the lumbar spine. There is no acute bony abnormality.  IMPRESSION: Negative chest.  Normal stool and bowel gas pattern with moderate stool burden.  Original Report Authenticated By: Brandon Melnick, M.D.    Review of Systems  Constitutional: Positive for chills. Negative for fever.  Cardiovascular: Negative for chest pain.  Gastrointestinal: Positive for abdominal pain and constipation. Negative for nausea, vomiting and diarrhea.   Blood pressure 113/55,  pulse 97, temperature 98.9 F (37.2 C), temperature source Oral, resp. rate 14, weight 127 lb (57.607 kg), SpO2 90.00%. Physical Exam  Vitals reviewed. Constitutional: She appears well-developed and well-nourished.  Eyes: No scleral icterus.  Neck: Neck supple.  Cardiovascular: Normal rate, regular rhythm and normal heart sounds.   Respiratory: Effort normal and breath sounds normal. She has no wheezes. She has no rales.  GI: Soft. Normal appearance and bowel sounds are normal. There is no hepatomegaly. There is tenderness in the epigastric area and periumbilical area. There is no rebound and no guarding. No hernia.  Lymphadenopathy:    She has no cervical adenopathy.    Assessment/Plan: GSP  Discussed admission, NPO and follow labs.  We will follow along with you.  When pancreatitis is resolved, will plan on lap chole likely before discharge.  I discussed this plan with patient and son.  [name] 10/05/2011, 6:42 PM

## 2011-10-06 ENCOUNTER — Inpatient Hospital Stay (HOSPITAL_COMMUNITY): Payer: Medicare Other | Admitting: Anesthesiology

## 2011-10-06 ENCOUNTER — Encounter (HOSPITAL_COMMUNITY): Payer: Self-pay | Admitting: Anesthesiology

## 2011-10-06 ENCOUNTER — Inpatient Hospital Stay (HOSPITAL_COMMUNITY): Payer: Medicare Other

## 2011-10-06 ENCOUNTER — Encounter (HOSPITAL_COMMUNITY)
Admission: EM | Disposition: A | Discharge status: Home / Self Care | Payer: Self-pay | Source: Home / Self Care | Attending: Internal Medicine

## 2011-10-06 ENCOUNTER — Encounter (HOSPITAL_COMMUNITY): Payer: Self-pay | Admitting: Internal Medicine

## 2011-10-06 DIAGNOSIS — K8070 Calculus of gallbladder and bile duct without cholecystitis without obstruction: Secondary | ICD-10-CM | POA: Diagnosis present

## 2011-10-06 DIAGNOSIS — K859 Acute pancreatitis without necrosis or infection, unspecified: Principal | ICD-10-CM

## 2011-10-06 HISTORY — PX: ERCP: SHX5425

## 2011-10-06 HISTORY — PX: SPHINCTEROTOMY: SHX5544

## 2011-10-06 LAB — COMPREHENSIVE METABOLIC PANEL
Albumin: 2.8 g/dL — ABNORMAL LOW (ref 3.5–5.2)
Alkaline Phosphatase: 72 U/L (ref 39–117)
BUN: 13 mg/dL (ref 6–23)
CO2: 21 mEq/L (ref 19–32)
Chloride: 101 mEq/L (ref 96–112)
Creatinine, Ser: 0.69 mg/dL (ref 0.50–1.10)
GFR calc non Af Amer: 79 mL/min — ABNORMAL LOW (ref >90)
Glucose, Bld: 56 mg/dL — ABNORMAL LOW (ref 70–99)
Potassium: 4 mEq/L (ref 3.5–5.1)
Total Bilirubin: 0.5 mg/dL (ref 0.3–1.2)

## 2011-10-06 LAB — CBC
MCV: 88.8 fL (ref 78.0–100.0)
Platelets: 205 10*3/uL (ref 150–400)
RBC: 3.74 MIL/uL — ABNORMAL LOW (ref 3.87–5.11)
RDW: 17 % — ABNORMAL HIGH (ref 11.5–15.5)
WBC: 13 10*3/uL — ABNORMAL HIGH (ref 4.0–10.5)

## 2011-10-06 LAB — GLUCOSE, CAPILLARY: Glucose-Capillary: 42 mg/dL — CL (ref 70–99)

## 2011-10-06 LAB — LIPASE, BLOOD: Lipase: 554 U/L — ABNORMAL HIGH (ref 11–59)

## 2011-10-06 LAB — LIPID PANEL
LDL Cholesterol: 79 mg/dL (ref 0–99)
Triglycerides: 98 mg/dL (ref <150)

## 2011-10-06 SURGERY — ERCP, WITH INTERVENTION IF INDICATED
Anesthesia: General

## 2011-10-06 MED ORDER — LACTATED RINGERS IV SOLN
INTRAVENOUS | Status: DC | PRN
  Administered 2011-10-06: 14:00:00 via INTRAVENOUS

## 2011-10-06 MED ORDER — SUCCINYLCHOLINE CHLORIDE 20 MG/ML IJ SOLN
INTRAMUSCULAR | Status: DC | PRN
  Administered 2011-10-06: 80 mg via INTRAVENOUS

## 2011-10-06 MED ORDER — SODIUM CHLORIDE 0.9 % IV SOLN
INTRAVENOUS | Status: DC | PRN
  Administered 2011-10-06: 16:00:00

## 2011-10-06 MED ORDER — FENTANYL CITRATE 0.05 MG/ML IJ SOLN
INTRAMUSCULAR | Status: DC | PRN
  Administered 2011-10-06: 50 ug via INTRAVENOUS

## 2011-10-06 MED ORDER — PROPOFOL 10 MG/ML IV EMUL
INTRAVENOUS | Status: DC | PRN
  Administered 2011-10-06: 80 mg via INTRAVENOUS

## 2011-10-06 MED ORDER — FENTANYL CITRATE 0.05 MG/ML IJ SOLN: 25.0000 ug | INTRAMUSCULAR | Status: DC | PRN

## 2011-10-06 MED ORDER — ESMOLOL HCL 10 MG/ML IV SOLN
INTRAVENOUS | Status: DC | PRN
  Administered 2011-10-06: 10 mg via INTRAVENOUS

## 2011-10-06 MED ORDER — NEOSTIGMINE METHYLSULFATE 1 MG/ML IJ SOLN
INTRAMUSCULAR | Status: DC | PRN
  Administered 2011-10-06: 3 mg via INTRAVENOUS

## 2011-10-06 MED ORDER — PROMETHAZINE HCL 25 MG/ML IJ SOLN: 6.2500 mg | INTRAMUSCULAR | Status: DC | PRN

## 2011-10-06 MED ORDER — MORPHINE SULFATE 2 MG/ML IJ SOLN: 1.0000 mg | INTRAMUSCULAR | Status: DC | PRN

## 2011-10-06 MED ORDER — GLYCOPYRROLATE 0.2 MG/ML IJ SOLN
INTRAMUSCULAR | Status: DC | PRN
  Administered 2011-10-06: 0.4 mg via INTRAVENOUS

## 2011-10-06 MED ORDER — ROCURONIUM BROMIDE 100 MG/10ML IV SOLN
INTRAVENOUS | Status: DC | PRN
  Administered 2011-10-06: 20 mg via INTRAVENOUS

## 2011-10-06 MED ORDER — ONDANSETRON HCL 4 MG/2ML IJ SOLN
INTRAMUSCULAR | Status: DC | PRN
  Administered 2011-10-06: 4 mg via INTRAVENOUS

## 2011-10-06 MED ORDER — SODIUM CHLORIDE 0.9 % IV SOLN
INTRAVENOUS | Status: AC
  Filled 2011-10-06: qty 1.5

## 2011-10-06 MED ORDER — SODIUM CHLORIDE 0.9 % IV SOLN
1.5000 g | Freq: Once | INTRAVENOUS | Status: AC
  Administered 2011-10-06: 1.5 mg via INTRAVENOUS
  Filled 2011-10-06: qty 1.5

## 2011-10-06 MED ORDER — DEXTROSE 50 % IV SOLN
25.0000 mL | Freq: Once | INTRAVENOUS | Status: AC
  Administered 2011-10-06: 25 mL via INTRAVENOUS

## 2011-10-06 MED ORDER — LIDOCAINE HCL (CARDIAC) 20 MG/ML IV SOLN
INTRAVENOUS | Status: DC | PRN
  Administered 2011-10-06: 80 mg via INTRAVENOUS

## 2011-10-06 MED ORDER — LACTATED RINGERS IV SOLN
INTRAVENOUS | Status: DC
  Administered 2011-10-07: 10:00:00 via INTRAVENOUS

## 2011-10-06 NOTE — Op Note (Signed)
[facility] [address] [place], [place]  [address]  ERCP PROCEDURE REPORT  PATIENT:  Bridget, Franklin  MR#:  [id number] BIRTHDATE:  [birth year]  GENDER:  female  ENDOSCOPIST:  Iva Boop, MD, [facility] ASSISTANT:  Beryle Beams, Technician, Jimmey Ralph, RN, CGRN, Anthony Sar, RN  PROCEDURE DATE:  10/06/2011 PROCEDURE:  ERCP with sphincterotomy ASA CLASS:  Class III  INDICATIONS:  suspected stone Pancreatitis with recent US showing CBD stone  MEDICATIONS:   See Anesthesia Report. Unasyn 1.5 g IV TOPICAL ANESTHETIC:  Cetacaine Spray  DESCRIPTION OF PROCEDURE:   After the risks benefits and alternatives of the procedure were thoroughly explained, informed consent was obtained.  The Pentax ERCP [id number] endoscope was introduced through the mouth and advanced to the second portion of the duodenum.  Esophagus was not seen well. The stomach was normal. The duodenum looked normal except for a periampullary diverticulum. There was a normal-appearing major papilla.  The papilla was easily cannulated and deep cannulation obtained using wire technique and sphincterotome. Contrast was injected the bile duct, selectively. Showed a dilated biliary tree. There was a change in caliber of the bile duct, with a common hepatic duct about 12 mm versus 10 mm common bile duct. I think this was an effect of the cystic duct. No stricture was seen. Given the suspected bile duct stone as seen on ultrasound, a biliary sphincterotomy was performed. A balloon was passed, and multiple balloon sweeps failed to reveal a stone. Occlusion cholangiogram was negative. There was partial gallbladder filling The pancreas was not injected, by intent.  The scope was then completely withdrawn from the patient and the procedure terminated. <<PROCEDUREIMAGES>>  COMPLICATIONS:  None  ENDOSCOPIC IMPRESSION:  1) Dilated biliary tree, maximum 12mm. 2) No stones in the bile ducts. 3) Less clear that  she has had gallstone pancreatitis now that no stone seen here. Her LFT's were also normal.  RECOMMENDATIONS:    1) Observe 2) Depending upn clinial course may need additional imaging at some point (MR, EUS?) - pancreatic duct           was dilatd on CT but she is > 80 and can see that, no mass seen and ampulla normal. No rush for this. 3) ? stop HCTZ  Iva Boop, MD, Clementeen Graham  n. eSIGNED:   Iva Boop at 10/06/2011 03:59 PM  Maryella Shivers, [id number]

## 2011-10-06 NOTE — Anesthesia Preprocedure Evaluation (Addendum)
Anesthesia Evaluation  Patient identified by MRN, date of birth, ID band Patient awake    Reviewed: Allergy & Precautions, H&P , NPO status , Patient's Chart, lab work & pertinent test results  History of Anesthesia Complications Negative for: history of anesthetic complications  Airway Mallampati: II TM Distance: <3 FB Neck ROM: Full    Dental No notable dental hx. (+) Teeth Intact and Dental Advisory Given   Pulmonary former smoker breath sounds clear to auscultation  Pulmonary exam normal       Cardiovascular hypertension, Pt. on medications and Pt. on home beta blockers Rhythm:Regular Rate:Normal  States she has not had her BP medication in 24 hours. Tachycardia, patient states secondary to hypoglycemia.   Neuro/Psych negative neurological ROS  negative psych ROS   GI/Hepatic Neg liver ROS, GERD-  ,  Endo/Other  Hypothyroidism   Renal/GU negative Renal ROS  negative genitourinary   Musculoskeletal negative musculoskeletal ROS (+)   Abdominal   Peds negative pediatric ROS (+)  Hematology negative hematology ROS (+)   Anesthesia Other Findings   Reproductive/Obstetrics negative OB ROS                         Anesthesia Physical Anesthesia Plan  ASA: II  Anesthesia Plan: General   Post-op Pain Management:    Induction: Intravenous  Airway Management Planned: Oral ETT  Additional Equipment:   Intra-op Plan:   Post-operative Plan: Extubation in OR  Informed Consent: I have reviewed the patients History and Physical, chart, labs and discussed the procedure including the risks, benefits and alternatives for the proposed anesthesia with the patient or authorized representative who has indicated his/her understanding and acceptance.   Dental advisory given  Plan Discussed with: CRNA  Anesthesia Plan Comments:         Anesthesia Quick Evaluation

## 2011-10-06 NOTE — Transfer of Care (Signed)
Immediate Anesthesia Transfer of Care Note  Patient: [name]  Procedure(s) Performed: Procedure(s) (LRB): ENDOSCOPIC RETROGRADE CHOLANGIOPANCREATOGRAPHY (ERCP) (N/A) SPHINCTEROTOMY ()  Patient Location: PACU and Endoscopy Unit  Anesthesia Type: General  Level of Consciousness: awake, alert , oriented and patient cooperative  Airway & Oxygen Therapy: Patient Spontanous Breathing and Patient connected to face mask oxygen  Post-op Assessment: Report given to PACU RN, Post -op Vital signs reviewed and stable and Patient moving all extremities  Post vital signs: Reviewed and stable  Complications: No apparent anesthesia complications 

## 2011-10-06 NOTE — Progress Notes (Signed)
Subjective: Patient relates feeling better. She relates abdominal pain slightly improved. No dyspnea. No nausea.  Objective: Filed Vitals:   10/05/11 2137 10/05/11 2155 10/06/11 0430 10/06/11 0435  BP: 93/43 117/74 94/57   Pulse: 108 110 103   Temp: 98.7 F (37.1 C) 98.6 F (37 C) 98.8 F (37.1 C)   TempSrc: Oral Oral Oral   Resp: 16 18 14    Height:  5\' 4"  (1.626 m)    Weight:  57 kg (125 lb 10.6 oz)  57 kg (125 lb 10.6 oz)  SpO2: 94% 92% 94%    Weight change:   Intake/Output Summary (Last 24 hours) at 10/06/11 1324 Last data filed at 10/06/11 1245  Gross per 24 hour  Intake   3335 ml  Output    250 ml  Net   3085 ml    General: Alert, awake, oriented x3, in no acute distress.  HEENT: No bruits, no goiter.  Heart: Regular rate and rhythm, without murmurs, rubs, gallops.  Lungs: CTA, bilateral air movement.  Abdomen: Soft, mild tender generalized, nondistended, positive bowel sounds. No rigidity. Neuro: Grossly intact, nonfocal. Extremities; no edema.   Lab Results:  [facility] 10/06/11 0348 10/05/11 1205  NA 134* 135  K 4.0 3.4*  CL 101 97  CO2 21 27  GLUCOSE 56* 79  BUN 13 14  CREATININE 0.69 0.79  CALCIUM 8.6 10.1  MG -- --  PHOS -- --    Basename 10/06/11 0348 10/05/11 1205  AST 15 22  ALT 9 13  ALKPHOS 72 81  BILITOT 0.5 0.5  PROT 6.2 7.7  ALBUMIN 2.8* 3.9    Basename 10/06/11 0348 10/05/11 1205  LIPASE 554* 1653*  AMYLASE -- --    Basename 10/06/11 0348 10/05/11 1205  WBC 13.0* 15.3*  NEUTROABS -- 12.2*  HGB 10.9* 12.9  HCT 33.2* 39.2  MCV 88.8 88.1  PLT 205 253    Basename 10/06/11 0345  CHOL 152  HDL 53  LDLCALC 79  TRIG 98  CHOLHDL 2.9  LDLDIRECT --    Micro Results: No results found for this or any previous visit (from the past 240 hour(s)).  Studies/Results: US Abdomen Complete  10/05/2011  The *RADIOLOGY REPORT*  Clinical Data:  Abdominal pain  ABDOMINAL ULTRASOUND COMPLETE  Comparison:  None.  Findings:  Gallbladder:   There are multiple mobile gallstones within the gallbladder lumen.  The largest measures approximately 1.3 cm. No gallbladder wall thickening or pericholecystic fluid. Negative sonographic Murphy's sign.  Common Bile Duct:   Dilated, measuring 11 millimeters. There is an 9 mm stone in the distal portion of the duct.  Liver: No focal mass lesion identified.  Within normal limits in parenchymal echogenicity. There is intrahepatic biliary ductal dilatation.  IVC:  Appears normal.  Pancreas:  There is mild pancreatic ductal dilatation.  Spleen:  Within normal limits in size and echotexture.  Right kidney:  Normal in size and parenchymal echogenicity.  No evidence of mass or hydronephrosis.  Left kidney:  Normal in size and parenchymal echogenicity.  No evidence of mass or hydronephrosis.  Abdominal Aorta:  No aneurysm identified.  A tiny right pleural effusion is noted.  IMPRESSION:  There is choledocholithiasis  with resultant common duct, intrahepatic duct, and pancreatic ductal dilatation.  Original Report Authenticated By: Brandon Melnick, M.D.   Ct Abdomen Pelvis W Contrast  10/05/2011  *RADIOLOGY REPORT*  Clinical Data: Abdominal pain.  CT ABDOMEN AND PELVIS WITH CONTRAST  Technique:  Multidetector CT imaging of  the abdomen and pelvis was performed following the standard protocol during bolus administration of intravenous contrast.  Contrast: OMNIPAQUE IOHEXOL 300 MG/ML IJ SOLN  Comparison: Radiographs dated 10/05/2011  Findings: There is dilatation of the bile ducts and of the pancreatic duct.  Common bile duct is at least 10 mm in diameter and the pancreatic duct is 10 mm in diameter.  There is no discrete definitive stone in the distal ducts there is no definitive mass of concern at the may be a stone in the distal common bile duct or a stricture or mass at the ampulla.  There is a duodenal diverticulum adjacent to the ampulla.  The gallbladder is distended.  There is slight dilatation of intrahepatic  bile ducts.  Liver parenchyma is otherwise normal.  Spleen is normal.  Pancreas demonstrates diffuse dilatation of the duct without a mass.  Adrenal glands and kidneys are normal.  Terminal ileum and appendix are normal.  Uterus and ovaries are normal.  Pessary in place.  No acute osseous abnormality.  Degenerative rotoscoliosis of the lumbar spine.  IMPRESSION: Dilated pancreatic and bile ducts worrisome for distal ductal obstruction which could be due to a distal stone, stricture or mass.  No other significant abnormalities.  Original Report Authenticated By: Gwynn Burly, M.D.   Dg Abd Acute W/chest  10/05/2011  *RADIOLOGY REPORT*  Clinical Data: Abdominal pain  ACUTE ABDOMEN SERIES (ABDOMEN 2 VIEW & CHEST 1 VIEW)  Comparison: March 30, 2011 chest x-ray  Findings: The cardiac silhouette, mediastinum, pulmonary vasculature are within normal limits.  Both lungs are clear.  The stool and bowel gas pattern is within normal limits.  There is a moderate stool burden in the rectosigmoid colon, as well as the descending colon.  No pneumoperitoneum.  There is no gross organomegaly or abnormal calcifications.  There is left convex lumbar scoliosis and degenerative changes within the lumbar spine. There is no acute bony abnormality.  IMPRESSION: Negative chest.  Normal stool and bowel gas pattern with moderate stool burden.  Original Report Authenticated By: Brandon Melnick, M.D.    Medications: I have reviewed the patient's current medications.   Patient Active Hospital Problem List:  Gallstone pancreatitis //Cholelithiasis with choledocholithiasis  Abdominal pain improved. Lipase trending down. For ERCP today for possible sphincterectomy and stone extraction.  Patient will need cholecystectomy during this hospitalization. Continue with IV fluids, NPO, pain management.   Hypertension SBP soft, Holding HCTZ.  Hypothyroidism  Continue with synthroid.   GERD   Continue with protonix.   Sinus  tachycardia  Continue with metoprolol.   Leukocytosis  Likely secondary to pancreatitis, reactive. WBC trending down. Patient received dose Unasyn.        LOS: 1 day   Jevon Littlepage M.D.  [facility] 10/06/2011, 1:24 PM

## 2011-10-06 NOTE — Transfer of Care (Signed)
Immediate Anesthesia Transfer of Care Note  Patient: [name]  Procedure(s) Performed: Procedure(s) (LRB): ENDOSCOPIC RETROGRADE CHOLANGIOPANCREATOGRAPHY (ERCP) (N/A) SPHINCTEROTOMY ()  Patient Location: PACU and Endoscopy Unit  Anesthesia Type: General  Level of Consciousness: awake, alert , oriented and patient cooperative  Airway & Oxygen Therapy: Patient Spontanous Breathing and Patient connected to face mask oxygen  Post-op Assessment: Report given to PACU RN, Post -op Vital signs reviewed and stable and Patient moving all extremities  Post vital signs: Reviewed and stable  Complications: No apparent anesthesia complications

## 2011-10-06 NOTE — Progress Notes (Signed)
UR CHART REVIEWED; B Rachelle Edwards RN, BSN, MHA 

## 2011-10-06 NOTE — Anesthesia Postprocedure Evaluation (Signed)
  Anesthesia Post-op Note  Patient: [name]  Procedure(s) Performed: Procedure(s) (LRB): ENDOSCOPIC RETROGRADE CHOLANGIOPANCREATOGRAPHY (ERCP) (N/A) SPHINCTEROTOMY ()  Patient Location: PACU  Anesthesia Type: General  Level of Consciousness: awake and alert   Airway and Oxygen Therapy: Patient Spontanous Breathing  Post-op Pain: mild  Post-op Assessment: Post-op Vital signs reviewed, Patient's Cardiovascular Status Stable, Respiratory Function Stable, Patent Airway and No signs of Nausea or vomiting  Post-op Vital Signs: stable  Complications: No apparent anesthesia complications

## 2011-10-06 NOTE — Progress Notes (Signed)
Pt says she has hypoglycemia. At 1426 CBG =42. 25ml of D50 given. Dr. Leone Payor and Dr Rica Mast aware. Rechecked at 1445 CBG= 117. S. Chadwin Fury RN

## 2011-10-06 NOTE — Consult Note (Addendum)
[facility] Consultation  Requesting Provider: Hospitalist Primary Care Physician:  [name], MD, MD Primary Gastroenterologist:  New to our service  Reason for Consultation:  Choledocholithiasis, gallstone pancreatitis  Assessment:  78 year old white woman with clinical scenario compatible with gallstone pancreatitis which actually is improved. Ultrasound shows a 9 mm common bile duct stone. She also has cholelithiasis. Pancreatic duct was dilated on CT scan but pancreas was otherwise normal. She has had an elevated lipase is improving. Liver function tests are actually normal.      Recommendations: Perform ERCP with possible sphincterotomy and stone extraction today. I have explained the risks benefits and indications the patient understands and agrees to proceed. All questions were answered.     HPI: [name] is a 78 y.o. female with a several day history of abdominal pain. On 226 she developed upper abdominal pain in the epigastrium after drinking some tea. She presented to the emergency department but felt better and went home. There was some intermittent epigastric pain. She went back to primary care because of persistent abdominal soreness, around the day of admission. She was sent to the emergency department where investigation revealed an elevated lipase, CT scanning and ultrasound findings as outlined in the lobe. He points were that she had gallstones, dilated pancreatic and bile ducts and a suspected common duct stone on the ultrasound. She also has a periampullary diverticulum per CT. She was admitted and she is actually improved complaining of only mild upper abdominal soreness at this time. She has not had nausea vomiting or bowel changes. She denies any history of dysphagia or any chronic GI problems. Overall she remains vigorous and lives alone and takes care of her own home. She has not had any chills or fever. No dyspnea or chest pain. Her  headaches or visual changes.  Past Medical History  Diagnosis Date  . Hypertension   . Arthritis   . Thyroid disease   . GERD (gastroesophageal reflux disease)   . Breast cancer     Past Surgical History  Procedure Date  . Joint replacement     right knee  . Carpal tunnel release     bilateral  . Incontinence surgery   . Cataract extraction     left  . Mastectomy     right due to cancer    Prior to Admission medications   Medication Sig Start Date End Date Taking? Authorizing Provider  cetirizine (ZYRTEC) 10 MG tablet Take 10 mg by mouth daily.   Yes Historical Provider, MD  Cholecalciferol (VITAMIN D3) 5000 UNITS CAPS Take 5,000 Units by mouth daily.   Yes Historical Provider, MD  estradiol (ESTRING) 2 MG vaginal ring Place 2 mg vaginally every 3 (three) months. follow package directions   Yes Historical Provider, MD  hydrochlorothiazide (HYDRODIURIL) 25 MG tablet Take 25 mg by mouth daily as needed. fluid   Yes Historical Provider, MD  levothyroxine (SYNTHROID, LEVOTHROID) 100 MCG tablet Take 50-100 mcg by mouth See admin instructions. Pt takes 1/2 tab for 50 mcg dose on Tuesday,thursday,saturday,sunday. 100 mcg  On Monday, Wednesday,friday   Yes Historical Provider, MD  Magnesium 250 MG TABS Take 250 mg by mouth every morning.   Yes Historical Provider, MD  metoprolol tartrate (LOPRESSOR) 25 MG tablet Take 12.5 mg by mouth every evening. Pt takes 1/2 tablet of 25 mg  for 12.5 mg dose   Yes Historical Provider, MD  multivitamin (THERAGRAN) per tablet Take 1 tablet by mouth daily.   Yes Historical  Provider, MD  Phenylephrine-Bromphen-DM (PHENYLEPHRINE COMPLEX PO) Take 10 mg by mouth every morning.   Yes Historical Provider, MD    Current Facility-Administered Medications  Medication Dose Route Frequency Provider Last Rate Last Dose  . 0.9 %  sodium chloride infusion   Intravenous Continuous Cristal Ford, MD 125 mL/hr at 10/05/11 2305 1,000 mL at 10/05/11 2305  .  acetaminophen (TYLENOL) tablet 650 mg  650 mg Oral Q6H PRN Cristal Ford, MD       Or  . acetaminophen (TYLENOL) suppository 650 mg  650 mg Rectal Q6H PRN Cristal Ford, MD      . Ampicillin-Sulbactam (UNASYN) 3 g in sodium chloride 0.9 % 100 mL IVPB  3 g Intravenous Once Ward Givens, MD   3 g at 10/05/11 1743  . cholecalciferol (VITAMIN D) tablet 5,000 Units  5,000 Units Oral Daily Cristal Ford, MD      . enoxaparin (LOVENOX) injection 40 mg  40 mg Subcutaneous Q24H Cristal Ford, MD      . iohexol (OMNIPAQUE) 300 MG/ML solution 100 mL  100 mL Intravenous Once PRN Medication Radiologist, MD   100 mL at 10/05/11 1422  . levothyroxine (SYNTHROID, LEVOTHROID) tablet 100 mcg  100 mcg Oral Custom Cristal Ford, MD   100 mcg at 10/06/11 0745  . levothyroxine (SYNTHROID, LEVOTHROID) tablet 50 mcg  50 mcg Oral Custom Cristal Ford, MD      . loratadine (CLARITIN) tablet 10 mg  10 mg Oral Daily Cristal Ford, MD      . metoprolol tartrate (LOPRESSOR) tablet 12.5 mg  12.5 mg Oral QPM Cristal Ford, MD      . morphine 2 MG/ML injection 1-2 mg  1-2 mg Intravenous Q4H PRN Cristal Ford, MD      . morphine 2 MG/ML injection 2 mg  2 mg Intravenous Once Ward Givens, MD   2 mg at 10/05/11 1323  . mulitivitamin with minerals tablet 1 tablet  1 tablet Oral Daily Cristal Ford, MD      . ondansetron [facility]) tablet 4 mg  4 mg Oral Q6H PRN Cristal Ford, MD       Or  . ondansetron (ZOFRAN) injection 4 mg  4 mg Intravenous Q6H PRN Cristal Ford, MD      . pantoprazole (PROTONIX) injection 40 mg  40 mg Intravenous Q12H Cristal Ford, MD   40 mg at 10/06/11 0127  . potassium chloride 10 mEq in 100 mL IVPB  10 mEq Intravenous Q1 Hr x 2 Cristal Ford, MD   10 mEq at 10/06/11 0126  . sodium chloride 0.9 % bolus 600 mL  600 mL Intravenous Once Ward Givens, MD   999 mL at 10/05/11 1220  . DISCONTD: 0.9 %  sodium chloride infusion   Intravenous Continuous Ward Givens, MD 100 mL/hr at 10/05/11 1742    .  DISCONTD: 0.9 %  sodium chloride infusion   Intravenous STAT Raeford Razor, MD 100 mL/hr at 10/05/11 2109    . DISCONTD: multivitamin (THERAGRAN) per tablet 1 tablet  1 tablet Oral Daily Cristal Ford, MD      . DISCONTD: ondansetron (ZOFRAN) injection 4 mg  4 mg Intravenous Q6H PRN Ward Givens, MD   4 mg at 10/05/11 1323  . DISCONTD: ondansetron (ZOFRAN) injection 4 mg  4 mg Intravenous Q8H PRN Raeford Razor, MD      . DISCONTD: Vitamin D3  CAPS 5,000 Units  5,000 Units Oral Daily Cristal Ford, MD        Allergies as of 10/05/2011  . (No Known Allergies)    Family History  Problem Relation Age of Onset  . Stroke Mother   . Breast cancer Mother   . Heart disease Mother   . Stroke Father   . Heart disease Father   . Breast cancer Daughter     History   Social History  . Marital Status: Widowed    Spouse Name: N/A    Number of Children: 2  . Years of Education: N/A     .    Social History Main Topics  . Smoking status: Former Games developer  . Smokeless tobacco: Never Used   Comment: Quit 50 years ago.  . Alcohol Use: Yes     Twice a year.    No       Other Topics Concern      Social History Narrative  .  lives alone but has a female friend across the street     Review of Systems: Positive for what is mentioned in the history of present illness All other review of systems negative except as mentioned in the HPI.  Physical Exam: Vital signs in last 24 hours: Temp:  [98.6 F (37 C)-99.4 F (37.4 C)] 98.8 F (37.1 C) (03/01 0430) Pulse Rate:  [95-110] 103  (03/01 0430) Resp:  [14-18] 14  (03/01 0430) BP: (93-148)/(43-77) 94/57 mmHg (03/01 0430) SpO2:  [90 %-97 %] 94 % (03/01 0430) Weight:  [125 lb 10.6 oz (57 kg)-127 lb (57.607 kg)] 125 lb 10.6 oz (57 kg) (03/01 0435) Last BM Date: 10/03/11 General:   Alert,  Well-developed, well-nourished, pleasant and cooperative in NAD Eyes:  Sclera clear, no icterus.   Conjunctiva pink. Mouth:  No deformity or lesions.   Oropharynx pink & moist. Neck:  Supple; no masses or thyromegaly. Lungs:  Clear throughout to auscultation.   Heart:  Regular rate and rhythm; no murmurs, clicks, rubs,  or gallops. Abdomen:  Soft, Normal bowel sounds. There is some mild to moderate tenderness in the right upper quadrant a little bit of guarding. No rebound.  No hepatosplenomegaly or hernia Extremities:  Without clubbing or edema. Neurologic:  Alert and  oriented x 3 Skin:  Intact without significant lesions or rashes. Lymph Nodes:  No significant cervical or supraclavicular adenopathy. Psych:  Alert and cooperative. Normal mood and affect.  Intake/Output from previous day: 02/28 0701 - 03/01 0700 In: 3335 [P.O.:1500; I.V.:1725; IV Piggyback:110] Out: 250 [Urine:250]   Lab Results:  [facility] 10/06/11 0348 10/05/11 1205  WBC 13.0* 15.3*  HGB 10.9* 12.9  HCT 33.2* 39.2  PLT 205 253   BMET  Basename 10/06/11 0348 10/05/11 1205  NA 134* 135  K 4.0 3.4*  CL 101 97  CO2 21 27  GLUCOSE 56* 79  BUN 13 14  CREATININE 0.69 0.79  CALCIUM 8.6 10.1   LFT  Basename 10/06/11 0348  PROT 6.2  ALBUMIN 2.8*  AST 15  ALT 9  ALKPHOS 72  BILITOT 0.5  BILIDIR --  IBILI --     Studies/Results: Ultrasound and CT images personally reviewed US Abdomen Complete  10/05/2011  The *RADIOLOGY REPORT*  Clinical Data:  Abdominal pain  ABDOMINAL ULTRASOUND COMPLETE  Comparison:  None.  Findings:  Gallbladder:  There are multiple mobile gallstones within the gallbladder lumen.  The largest measures approximately 1.3 cm. No gallbladder wall thickening or pericholecystic fluid.  Negative sonographic Murphy's sign.  Common Bile Duct:   Dilated, measuring 11 millimeters. There is an 9 mm stone in the distal portion of the duct.  Liver: No focal mass lesion identified.  Within normal limits in parenchymal echogenicity. There is intrahepatic biliary ductal dilatation.  IVC:  Appears normal.  Pancreas:  There is mild pancreatic ductal  dilatation.  Spleen:  Within normal limits in size and echotexture.  Right kidney:  Normal in size and parenchymal echogenicity.  No evidence of mass or hydronephrosis.  Left kidney:  Normal in size and parenchymal echogenicity.  No evidence of mass or hydronephrosis.  Abdominal Aorta:  No aneurysm identified.  A tiny right pleural effusion is noted.  IMPRESSION:  There is choledocholithiasis  with resultant common duct, intrahepatic duct, and pancreatic ductal dilatation.  Original Report Authenticated By: Brandon Melnick, M.D.   Ct Abdomen Pelvis W Contrast  10/05/2011  *RADIOLOGY REPORT*  Clinical Data: Abdominal pain.  CT ABDOMEN AND PELVIS WITH CONTRAST  Technique:  Multidetector CT imaging of the abdomen and pelvis was performed following the standard protocol during bolus administration of intravenous contrast.  Contrast: OMNIPAQUE IOHEXOL 300 MG/ML IJ SOLN  Comparison: Radiographs dated 10/05/2011  Findings: There is dilatation of the bile ducts and of the pancreatic duct.  Common bile duct is at least 10 mm in diameter and the pancreatic duct is 10 mm in diameter.  There is no discrete definitive stone in the distal ducts there is no definitive mass of concern at the may be a stone in the distal common bile duct or a stricture or mass at the ampulla.  There is a duodenal diverticulum adjacent to the ampulla.  The gallbladder is distended.  There is slight dilatation of intrahepatic bile ducts.  Liver parenchyma is otherwise normal.  Spleen is normal.  Pancreas demonstrates diffuse dilatation of the duct without a mass.  Adrenal glands and kidneys are normal.  Terminal ileum and appendix are normal.  Uterus and ovaries are normal.  Pessary in place.  No acute osseous abnormality.  Degenerative rotoscoliosis of the lumbar spine.  IMPRESSION: Dilated pancreatic and bile ducts worrisome for distal ductal obstruction which could be due to a distal stone, stricture or mass.  No other significant  abnormalities.  Original Report Authenticated By: Gwynn Burly, M.D.   Dg Abd Acute W/chest  10/05/2011  *RADIOLOGY REPORT*  Clinical Data: Abdominal pain  ACUTE ABDOMEN SERIES (ABDOMEN 2 VIEW & CHEST 1 VIEW)  Comparison: March 30, 2011 chest x-ray  Findings: The cardiac silhouette, mediastinum, pulmonary vasculature are within normal limits.  Both lungs are clear.  The stool and bowel gas pattern is within normal limits.  There is a moderate stool burden in the rectosigmoid colon, as well as the descending colon.  No pneumoperitoneum.  There is no gross organomegaly or abnormal calcifications.  There is left convex lumbar scoliosis and degenerative changes within the lumbar spine. There is no acute bony abnormality.  IMPRESSION: Negative chest.  Normal stool and bowel gas pattern with moderate stool burden.  Original Report Authenticated By: Brandon Melnick, M.D.     Previous Endoscopies: Screening colonoscopy a number of years ago, exact date none known. Reported as normal. Dr. Ewing Schlein     LOS: 1 day      @Kelce Bouton  Sena Slate, MD, [facility] [phone] (pager) 10/06/2011 9:32 AM@

## 2011-10-06 NOTE — Preoperative (Signed)
Beta Blockers   Reason not to administer Beta Blockers:Esmolol given intraoperative 10-06-11

## 2011-10-07 ENCOUNTER — Encounter (HOSPITAL_COMMUNITY)
Admission: EM | Disposition: A | Discharge status: Home / Self Care | Payer: Self-pay | Source: Home / Self Care | Attending: Internal Medicine

## 2011-10-07 ENCOUNTER — Inpatient Hospital Stay (HOSPITAL_COMMUNITY): Payer: Medicare Other | Admitting: Anesthesiology

## 2011-10-07 ENCOUNTER — Encounter (HOSPITAL_COMMUNITY): Payer: Self-pay | Admitting: Anesthesiology

## 2011-10-07 ENCOUNTER — Inpatient Hospital Stay (HOSPITAL_COMMUNITY): Payer: Medicare Other

## 2011-10-07 DIAGNOSIS — K8010 Calculus of gallbladder with chronic cholecystitis without obstruction: Secondary | ICD-10-CM

## 2011-10-07 HISTORY — PX: CHOLECYSTECTOMY: SHX55

## 2011-10-07 LAB — COMPREHENSIVE METABOLIC PANEL
ALT: 9 U/L (ref 0–35)
Alkaline Phosphatase: 72 U/L (ref 39–117)
CO2: 25 mEq/L (ref 19–32)
Chloride: 102 mEq/L (ref 96–112)
GFR calc Af Amer: >90 mL/min (ref >90)
GFR calc non Af Amer: 79 mL/min — ABNORMAL LOW (ref >90)
Glucose, Bld: 89 mg/dL (ref 70–99)
Potassium: 3.7 mEq/L (ref 3.5–5.1)
Sodium: 133 mEq/L — ABNORMAL LOW (ref 135–145)

## 2011-10-07 LAB — CBC
HCT: 30.9 % — ABNORMAL LOW (ref 36.0–46.0)
MCHC: 33.3 g/dL (ref 30.0–36.0)
RDW: 17.1 % — ABNORMAL HIGH (ref 11.5–15.5)

## 2011-10-07 SURGERY — LAPAROSCOPIC CHOLECYSTECTOMY WITH INTRAOPERATIVE CHOLANGIOGRAM
Anesthesia: General | Site: Abdomen | Wound class: Clean Contaminated

## 2011-10-07 MED ORDER — HYDROCODONE-ACETAMINOPHEN 5-325 MG PO TABS: 1.0000 | ORAL_TABLET | ORAL | Status: DC | PRN

## 2011-10-07 MED ORDER — FENTANYL CITRATE 0.05 MG/ML IJ SOLN
INTRAMUSCULAR | Status: DC | PRN
  Administered 2011-10-07 (×2): 50 ug via INTRAVENOUS

## 2011-10-07 MED ORDER — GLYCOPYRROLATE 0.2 MG/ML IJ SOLN
INTRAMUSCULAR | Status: DC | PRN
  Administered 2011-10-07: .4 mg via INTRAVENOUS

## 2011-10-07 MED ORDER — PROPOFOL 10 MG/ML IV EMUL
INTRAVENOUS | Status: DC | PRN
  Administered 2011-10-07: 100 mg via INTRAVENOUS

## 2011-10-07 MED ORDER — FENTANYL CITRATE 0.05 MG/ML IJ SOLN: 25.0000 ug | INTRAMUSCULAR | Status: DC | PRN

## 2011-10-07 MED ORDER — CEFAZOLIN SODIUM 1-5 GM-% IV SOLN
1.0000 g | INTRAVENOUS | Status: AC
  Administered 2011-10-07: 1 g via INTRAVENOUS
  Filled 2011-10-07: qty 50

## 2011-10-07 MED ORDER — ONDANSETRON HCL 4 MG/2ML IJ SOLN
INTRAMUSCULAR | Status: DC | PRN
  Administered 2011-10-07: 4 mg via INTRAVENOUS

## 2011-10-07 MED ORDER — NEOSTIGMINE METHYLSULFATE 1 MG/ML IJ SOLN
INTRAMUSCULAR | Status: DC | PRN
  Administered 2011-10-07: 3 mg via INTRAVENOUS

## 2011-10-07 MED ORDER — ROCURONIUM BROMIDE 100 MG/10ML IV SOLN
INTRAVENOUS | Status: DC | PRN
  Administered 2011-10-07: 35 mg via INTRAVENOUS

## 2011-10-07 MED ORDER — BUPIVACAINE-EPINEPHRINE PF 0.25-1:200000 % IJ SOLN
INTRAMUSCULAR | Status: AC
  Filled 2011-10-07: qty 30

## 2011-10-07 MED ORDER — BUPIVACAINE-EPINEPHRINE 0.25% -1:200000 IJ SOLN
INTRAMUSCULAR | Status: DC | PRN
  Administered 2011-10-07: 19 mL

## 2011-10-07 MED ORDER — SODIUM CHLORIDE 0.9 % IV SOLN
INTRAVENOUS | Status: DC
  Administered 2011-10-08: 05:00:00 via INTRAVENOUS

## 2011-10-07 MED ORDER — IOHEXOL 300 MG/ML  SOLN
INTRAMUSCULAR | Status: DC | PRN
  Administered 2011-10-07: 50 mL

## 2011-10-07 MED ORDER — 0.9 % SODIUM CHLORIDE (POUR BTL) OPTIME
TOPICAL | Status: DC | PRN
  Administered 2011-10-07: 1000 mL

## 2011-10-07 MED ORDER — DEXAMETHASONE SODIUM PHOSPHATE 10 MG/ML IJ SOLN
INTRAMUSCULAR | Status: DC | PRN
  Administered 2011-10-07: 10 mg via INTRAVENOUS

## 2011-10-07 MED ORDER — LIDOCAINE HCL (CARDIAC) 20 MG/ML IV SOLN
INTRAVENOUS | Status: DC | PRN
  Administered 2011-10-07: 60 mg via INTRAVENOUS

## 2011-10-07 MED ORDER — LACTATED RINGERS IR SOLN
Status: DC | PRN
  Administered 2011-10-07: 1000 mL

## 2011-10-07 MED ORDER — IOHEXOL 300 MG/ML  SOLN
INTRAMUSCULAR | Status: AC
  Filled 2011-10-07: qty 1

## 2011-10-07 SURGICAL SUPPLY — 45 items
ADH SKN CLS APL DERMABOND .7 (GAUZE/BANDAGES/DRESSINGS) ×1
APL SKNCLS STERI-STRIP NONHPOA (GAUZE/BANDAGES/DRESSINGS)
APPLIER CLIP ROT 10 11.4 M/L (STAPLE) ×2
APR CLP MED LRG 11.4X10 (STAPLE) ×1
BAG SPEC RTRVL LRG 6X4 10 (ENDOMECHANICALS) ×1
BENZOIN TINCTURE PRP APPL 2/3 (GAUZE/BANDAGES/DRESSINGS) IMPLANT
CABLE HI FREQUENCY MONOPOLAR (ELECTROSURGICAL) ×1 IMPLANT
CANISTER SUCTION 2500CC (MISCELLANEOUS) ×2 IMPLANT
CATH REDDICK CHOLANGI 4FR 50CM (CATHETERS) IMPLANT
CLIP APPLIE ROT 10 11.4 M/L (STAPLE) ×1 IMPLANT
CLOTH BEACON ORANGE TIMEOUT ST (SAFETY) ×2 IMPLANT
COVER MAYO STAND STRL (DRAPES) ×2 IMPLANT
DECANTER SPIKE VIAL GLASS SM (MISCELLANEOUS) ×2 IMPLANT
DERMABOND ADVANCED (GAUZE/BANDAGES/DRESSINGS) ×1
DERMABOND ADVANCED .7 DNX12 (GAUZE/BANDAGES/DRESSINGS) IMPLANT
DRAPE C-ARM 42X72 X-RAY (DRAPES) ×2 IMPLANT
DRAPE LAPAROSCOPIC ABDOMINAL (DRAPES) ×2 IMPLANT
ELECT REM PT RETURN 9FT ADLT (ELECTROSURGICAL) ×2
ELECTRODE REM PT RTRN 9FT ADLT (ELECTROSURGICAL) ×1 IMPLANT
GLOVE BIOGEL PI IND STRL 7.0 (GLOVE) ×1 IMPLANT
GLOVE BIOGEL PI INDICATOR 7.0 (GLOVE) ×1
GLOVE SS BIOGEL STRL SZ 7.5 (GLOVE) ×1 IMPLANT
GLOVE SUPERSENSE BIOGEL SZ 7.5 (GLOVE) ×1
GOWN STRL NON-REIN LRG LVL3 (GOWN DISPOSABLE) ×2 IMPLANT
GOWN STRL REIN XL XLG (GOWN DISPOSABLE) ×4 IMPLANT
HEMOSTAT SURGICEL 4X8 (HEMOSTASIS) IMPLANT
IV CATH 14GX2 1/4 (CATHETERS) IMPLANT
IV SET EXT 30 76VOL 4 MALE LL (IV SETS) IMPLANT
KIT BASIN OR (CUSTOM PROCEDURE TRAY) ×2 IMPLANT
NS IRRIG 1000ML POUR BTL (IV SOLUTION) ×1 IMPLANT
POUCH SPECIMEN RETRIEVAL 10MM (ENDOMECHANICALS) ×1 IMPLANT
SET CHOLANGIOGRAPH MIX (MISCELLANEOUS) ×2 IMPLANT
SET IRRIG TUBING LAPAROSCOPIC (IRRIGATION / IRRIGATOR) ×2 IMPLANT
SLEEVE Z-THREAD 5X100MM (TROCAR) ×3 IMPLANT
SOLUTION ANTI FOG 6CC (MISCELLANEOUS) ×2 IMPLANT
STOPCOCK K 69 2C6206 (IV SETS) IMPLANT
STRIP CLOSURE SKIN 1/2X4 (GAUZE/BANDAGES/DRESSINGS) IMPLANT
SUT MNCRL AB 4-0 PS2 18 (SUTURE) ×2 IMPLANT
TOWEL OR 17X26 10 PK STRL BLUE (TOWEL DISPOSABLE) ×2 IMPLANT
TRAY LAP CHOLE (CUSTOM PROCEDURE TRAY) ×2 IMPLANT
TROCAR HASSON GELL 12X100 (TROCAR) ×1 IMPLANT
TROCAR XCEL BLUNT TIP 100MML (ENDOMECHANICALS) ×1 IMPLANT
TROCAR Z-THREAD FIOS 11X100 BL (TROCAR) ×2 IMPLANT
TROCAR Z-THREAD FIOS 5X100MM (TROCAR) ×2 IMPLANT
TUBING INSUFFLATION 10FT LAP (TUBING) ×1 IMPLANT

## 2011-10-07 NOTE — Preoperative (Signed)
Beta Blockers   Reason not to administer Beta Blockers:Metoprolol 12.5 taken 10-06-11 at 1700

## 2011-10-07 NOTE — Transfer of Care (Signed)
Immediate Anesthesia Transfer of Care Note  Patient: [name]  Procedure(s) Performed: Procedure(s) (LRB): LAPAROSCOPIC CHOLECYSTECTOMY WITH INTRAOPERATIVE CHOLANGIOGRAM (N/A)  Patient Location: PACU  Anesthesia Type: General  Level of Consciousness: awake, alert , oriented and patient cooperative  Airway & Oxygen Therapy: Patient Spontanous Breathing and Patient connected to face mask oxygen  Post-op Assessment: Report given to PACU RN, Post -op Vital signs reviewed and stable and Patient moving all extremities  Post vital signs: Reviewed and stable  Complications: No apparent anesthesia complications

## 2011-10-07 NOTE — Progress Notes (Signed)
Subjective: Patient feeling well. No abdominal pain only soreness.  No chest pain , no dyspnea.  Objective: Filed Vitals:   10/07/11 0944 10/07/11 1138 10/07/11 1200 10/07/11 1232  BP: 145/81 130/57  120/68  Pulse: 100 92    Temp: 98.6 F (37 C) 98 F (36.7 C) 98 F (36.7 C) 97.7 F (36.5 C)  TempSrc: Oral     Resp: 18 16 15    Height:      Weight:      SpO2: 96% 100%  97%   Weight change:   Intake/Output Summary (Last 24 hours) at 10/07/11 1254 Last data filed at 10/07/11 1200  Gross per 24 hour  Intake   2986 ml  Output    575 ml  Net   2411 ml    General: Alert, awake, oriented x3, in no acute distress.  HEENT: No bruits, no goiter.  Heart: Regular rate and rhythm, without murmurs, rubs, gallops.  Lungs: Crackles left side, bilateral air movement.  Abdomen: Soft, nontender, nondistended, positive bowel sounds.  Neuro: Grossly intact, nonfocal. Extremities; no edema.   Lab Results:  [facility] 10/07/11 0414 10/06/11 0348  NA 133* 134*  K 3.7 4.0  CL 102 101  CO2 25 21  GLUCOSE 89 56*  BUN 9 13  CREATININE 0.71 0.69  CALCIUM 8.8 8.6  MG -- --  PHOS -- --    Basename 10/07/11 0414 10/06/11 0348  AST 16 15  ALT 9 9  ALKPHOS 72 72  BILITOT 0.6 0.5  PROT 5.8* 6.2  ALBUMIN 2.5* 2.8*    Basename 10/07/11 0414 10/06/11 0348  LIPASE 184* 554*  AMYLASE -- --    Alvira Philips 10/07/11 0414 10/06/11 0348 10/05/11 1205  WBC 12.3* 13.0* --  NEUTROABS -- -- 12.2*  HGB 10.3* 10.9* --  HCT 30.9* 33.2* --  MCV 87.8 88.8 --  PLT 186 205 --   No results found for this basename: CKTOTAL:3,CKMB:3,CKMBINDEX:3,TROPONINI:3 in the last 72 hours No components found with this basename: POCBNP:3 No results found for this basename: DDIMER:2 in the last 72 hours No results found for this basename: HGBA1C:2 in the last 72 hours  Basename 10/06/11 0345  CHOL 152  HDL 53  LDLCALC 79  TRIG 98  CHOLHDL 2.9  LDLDIRECT --   No results found for this basename:  TSH,T4TOTAL,FREET3,T3FREE,THYROIDAB in the last 72 hours No results found for this basename: VITAMINB12:2,FOLATE:2,FERRITIN:2,TIBC:2,IRON:2,RETICCTPCT:2 in the last 72 hours  Micro Results: No results found for this or any previous visit (from the past 240 hour(s)).  Studies/Results: US Abdomen Complete  10/05/2011  The *RADIOLOGY REPORT*  Clinical Data:  Abdominal pain  ABDOMINAL ULTRASOUND COMPLETE  Comparison:  None.  Findings:  Gallbladder:  There are multiple mobile gallstones within the gallbladder lumen.  The largest measures approximately 1.3 cm. No gallbladder wall thickening or pericholecystic fluid. Negative sonographic Murphy's sign.  Common Bile Duct:   Dilated, measuring 11 millimeters. There is an 9 mm stone in the distal portion of the duct.  Liver: No focal mass lesion identified.  Within normal limits in parenchymal echogenicity. There is intrahepatic biliary ductal dilatation.  IVC:  Appears normal.  Pancreas:  There is mild pancreatic ductal dilatation.  Spleen:  Within normal limits in size and echotexture.  Right kidney:  Normal in size and parenchymal echogenicity.  No evidence of mass or hydronephrosis.  Left kidney:  Normal in size and parenchymal echogenicity.  No evidence of mass or hydronephrosis.  Abdominal Aorta:  No aneurysm identified.  A tiny right pleural effusion is noted.  IMPRESSION:  There is choledocholithiasis  with resultant common duct, intrahepatic duct, and pancreatic ductal dilatation.  Original Report Authenticated By: Brandon Melnick, M.D.   Ct Abdomen Pelvis W Contrast  10/05/2011  *RADIOLOGY REPORT*  Clinical Data: Abdominal pain.  CT ABDOMEN AND PELVIS WITH CONTRAST  Technique:  Multidetector CT imaging of the abdomen and pelvis was performed following the standard protocol during bolus administration of intravenous contrast.  Contrast: OMNIPAQUE IOHEXOL 300 MG/ML IJ SOLN  Comparison: Radiographs dated 10/05/2011  Findings: There is dilatation of the  bile ducts and of the pancreatic duct.  Common bile duct is at least 10 mm in diameter and the pancreatic duct is 10 mm in diameter.  There is no discrete definitive stone in the distal ducts there is no definitive mass of concern at the may be a stone in the distal common bile duct or a stricture or mass at the ampulla.  There is a duodenal diverticulum adjacent to the ampulla.  The gallbladder is distended.  There is slight dilatation of intrahepatic bile ducts.  Liver parenchyma is otherwise normal.  Spleen is normal.  Pancreas demonstrates diffuse dilatation of the duct without a mass.  Adrenal glands and kidneys are normal.  Terminal ileum and appendix are normal.  Uterus and ovaries are normal.  Pessary in place.  No acute osseous abnormality.  Degenerative rotoscoliosis of the lumbar spine.  IMPRESSION: Dilated pancreatic and bile ducts worrisome for distal ductal obstruction which could be due to a distal stone, stricture or mass.  No other significant abnormalities.  Original Report Authenticated By: Gwynn Burly, M.D.   Dg Ercp With Sphincterotomy  10/06/2011  *RADIOLOGY REPORT*  Clinical Data: : Biliary obstruction.  ERCP  Comparison:  Ultrasound dated 10/05/2011  Technique:  Multiple spot images obtained with the fluoroscopic device and submitted for interpretation post-procedure.  ERCP was performed by Dr. Leone Payor.  Findings: There is dilatation of the bile ducts. On several images there appears to be a meniscus sign in the distal common bile duct which could be a small stone.  Balloon catheter was pulled through the ampulla.  The last image demonstrates no visible stones in the biliary tree.  IMPRESSION: Biliary dilatation.  No stones visible in the bile ducts on the last image.  These images were submitted for radiologic interpretation only. Please see the procedural report for the amount of contrast and the fluoroscopy time utilized.  Original Report Authenticated By: Gwynn Burly, M.D.     Medications: I have reviewed the patient's current medications.   Gallstone pancreatitis //Cholelithiasis with choledocholithiasis  Abdominal pain improved. Lipase trending down. Patient had  ERCP on 3-1  For  sphincterectomy that show : Dilated biliary tree, maximum 12mm.  No stones in the bile ducts.  Less clear that she has had gallstone pancreatitis now that no stone seen here. Patient had Cholecystectomy 3-2. Tolerate procedure well. No complaints. Advance diet as tolerated.    Hypertension SBP soft, Holding HCTZ.   Hypothyroidism  Continue with synthroid.   GERD  Continue with protonix.   Sinus tachycardia  Continue with metoprolol.   Leukocytosis  Likely secondary to pancreatitis, reactive. WBC trending down. Patient received dose Unasyn.  Mild Hyponatremia; Continiue with IV fluids. Disposition: PT , OT consult.         LOS: 2 days   Angel Hobdy M.D.  [facility] 10/07/2011, 12:54 PM

## 2011-10-07 NOTE — Op Note (Signed)
Preoperative diagnosis: Cholelithiasis and gallstone pancreatits  Postoperative diagnosis: Same  Surgical procedure: Laparoscopic cholecystectomy with intraoperative cholangiogram  Surgeon: Sharlet Salina T. Bradey Luzier M.D.  Assistant: None  Anesthesia: General Endotracheal  Complications: None  Estimated blood loss: Minimal  Description of procedure: The patient brought to the operating room, placed in the supine position on the operating table, and general endotracheal anesthesia induced. The abdomen was widely sterilely prepped and draped. The patient had received preoperative IV antibiotics and PAS were in place. Patient timeout was performed the correct procedure verified. Standard 4 port technique was used with an open Hassan cannula at the umbilicus and the remainder of the ports placed under direct vision. The gallbladder was visualized. It appeared distended but not inflamed. The fundus was grasped and elevated up over the liver and the infundibulum retracted inferiolaterally. Peritoneum anterior and posterior to close triangle was incised and fibrofatty tissue stripped off the neck of the gallbladder toward the porta hepatis. The distal gallbladder was thoroughly dissected. The cystic artery was identified in close triangle and the cystic duct gallbladder junction dissected 360.  A good critical view was obtained. When the anatomy was clear the cystic duct was clipped at the gallbladder junction and an operative cholangiogram obtained through the cystic duct. This showed good filling of a normal common bile duct and intrahepatic ducts with free flow into the duodenum and no filling defects. Following this the Cholangiocath was removed and the cystic duct was doubly clipped proximally and divided. The cystic artery was doubly clipped proximally and distally and divided. The gallbladder was dissected free from its bed using hook cautery and removed through the umbilical port site. Complete  hemostasis was obtained in the gallbladder bed. The right upper quadrant was thoroughly irrigated and hemostasis assured. Trochars were removed and all CO2 evacuated and the [facility] trocar site fascial defect closed. Skin incisions were closed with subcuticular Monocryl and Dermabond. Sponge needle and instrument counts were correct. The patient was taken to PACU in good condition.  Shivaan Tierno T  10/07/2011

## 2011-10-07 NOTE — Anesthesia Postprocedure Evaluation (Signed)
  Anesthesia Post-op Note  Patient: [name]  Procedure(s) Performed: Procedure(s) (LRB): LAPAROSCOPIC CHOLECYSTECTOMY WITH INTRAOPERATIVE CHOLANGIOGRAM (N/A)  Patient Location: PACU  Anesthesia Type: General  Level of Consciousness: awake and alert   Airway and Oxygen Therapy: Patient Spontanous Breathing  Post-op Pain: mild  Post-op Assessment: Post-op Vital signs reviewed, Patient's Cardiovascular Status Stable, Respiratory Function Stable, Patent Airway and No signs of Nausea or vomiting  Post-op Vital Signs: stable  Complications: No apparent anesthesia complications

## 2011-10-07 NOTE — Anesthesia Preprocedure Evaluation (Addendum)
Anesthesia Evaluation  Patient identified by MRN, date of birth, ID band Patient awake    Reviewed: Allergy & Precautions, H&P , NPO status , Patient's Chart, lab work & pertinent test results  History of Anesthesia Complications (+) AWARENESS UNDER ANESTHESIA  Airway Mallampati: II TM Distance: >3 FB Neck ROM: Full    Dental  (+) Caps and Dental Advisory Given   Pulmonary former smoker breath sounds clear to auscultation  Pulmonary exam normal       Cardiovascular hypertension, Pt. on home beta blockers Rhythm:Regular Rate:Normal     Neuro/Psych negative neurological ROS  negative psych ROS   GI/Hepatic Neg liver ROS, GERD-  ,  Endo/Other  Hypothyroidism States borderline diabetes. Occasional hypoglycemia.  Renal/GU negative Renal ROS  negative genitourinary   Musculoskeletal negative musculoskeletal ROS (+)   Abdominal   Peds negative pediatric ROS (+)  Hematology negative hematology ROS (+)   Anesthesia Other Findings   Reproductive/Obstetrics negative OB ROS                         Anesthesia Physical Anesthesia Plan  ASA: III  Anesthesia Plan: General   Post-op Pain Management:    Induction: Intravenous  Airway Management Planned: Oral ETT  Additional Equipment:   Intra-op Plan:   Post-operative Plan: Extubation in OR  Informed Consent: I have reviewed the patients History and Physical, chart, labs and discussed the procedure including the risks, benefits and alternatives for the proposed anesthesia with the patient or authorized representative who has indicated his/her understanding and acceptance.   Dental advisory given  Plan Discussed with: CRNA  Anesthesia Plan Comments: (H/o breast cancer s/p chemotherapy. She states she has had chronic tachycardia since early 2000's. )        Anesthesia Quick Evaluation

## 2011-10-07 NOTE — Progress Notes (Signed)
Agree with Ms. Esterwood's assessment and plan. Rosene Pilling E. Magda Muise, MD, FACG   

## 2011-10-07 NOTE — Progress Notes (Signed)
Patient ID: [name], female   DOB: 1928-07-11, 78 y.o.   MRN: [id number] 1 Day Post-Op  Subjective: A little weak but no abd pain or nausea  Objective: Vital signs in last 24 hours: Temp:  [98.1 F (36.7 C)-99.7 F (37.6 C)] 98.8 F (37.1 C) (03/02 0623) Pulse Rate:  [90-108] 94  (03/02 0623) Resp:  [13-21] 16  (03/02 0623) BP: (103-143)/(48-115) 110/73 mmHg (03/02 0623) SpO2:  [96 %] 96 % (03/01 1418) Last BM Date: 10/03/11  Intake/Output from previous day: 03/01 0701 - 03/02 0700 In: 1601 [P.O.:480; I.V.:1121] Out: 550 [Urine:550] Intake/Output this shift:    General appearance: alert and no distress GI: normal findings: soft, non-tender  Lab Results:   [facility] 10/07/11 0414 10/06/11 0348  WBC 12.3* 13.0*  HGB 10.3* 10.9*  HCT 30.9* 33.2*  PLT 186 205   BMET  Basename 10/07/11 0414 10/06/11 0348  NA 133* 134*  K 3.7 4.0  CL 102 101  CO2 25 21  GLUCOSE 89 56*  BUN 9 13  CREATININE 0.71 0.69  CALCIUM 8.8 8.6   Lipase     Component Value Date/Time   LIPASE 184* 10/07/2011 0414      Studies/Results: US Abdomen Complete  10/05/2011  The *RADIOLOGY REPORT*  Clinical Data:  Abdominal pain  ABDOMINAL ULTRASOUND COMPLETE  Comparison:  None.  Findings:  Gallbladder:  There are multiple mobile gallstones within the gallbladder lumen.  The largest measures approximately 1.3 cm. No gallbladder wall thickening or pericholecystic fluid. Negative sonographic Murphy's sign.  Common Bile Duct:   Dilated, measuring 11 millimeters. There is an 9 mm stone in the distal portion of the duct.  Liver: No focal mass lesion identified.  Within normal limits in parenchymal echogenicity. There is intrahepatic biliary ductal dilatation.  IVC:  Appears normal.  Pancreas:  There is mild pancreatic ductal dilatation.  Spleen:  Within normal limits in size and echotexture.  Right kidney:  Normal in size and parenchymal echogenicity.  No evidence of mass or hydronephrosis.  Left kidney:   Normal in size and parenchymal echogenicity.  No evidence of mass or hydronephrosis.  Abdominal Aorta:  No aneurysm identified.  A tiny right pleural effusion is noted.  IMPRESSION:  There is choledocholithiasis  with resultant common duct, intrahepatic duct, and pancreatic ductal dilatation.  Original Report Authenticated By: Brandon Melnick, M.D.   Ct Abdomen Pelvis W Contrast  10/05/2011  *RADIOLOGY REPORT*  Clinical Data: Abdominal pain.  CT ABDOMEN AND PELVIS WITH CONTRAST  Technique:  Multidetector CT imaging of the abdomen and pelvis was performed following the standard protocol during bolus administration of intravenous contrast.  Contrast: OMNIPAQUE IOHEXOL 300 MG/ML IJ SOLN  Comparison: Radiographs dated 10/05/2011  Findings: There is dilatation of the bile ducts and of the pancreatic duct.  Common bile duct is at least 10 mm in diameter and the pancreatic duct is 10 mm in diameter.  There is no discrete definitive stone in the distal ducts there is no definitive mass of concern at the may be a stone in the distal common bile duct or a stricture or mass at the ampulla.  There is a duodenal diverticulum adjacent to the ampulla.  The gallbladder is distended.  There is slight dilatation of intrahepatic bile ducts.  Liver parenchyma is otherwise normal.  Spleen is normal.  Pancreas demonstrates diffuse dilatation of the duct without a mass.  Adrenal glands and kidneys are normal.  Terminal ileum and appendix are normal.  Uterus and ovaries are normal.  Pessary in place.  No acute osseous abnormality.  Degenerative rotoscoliosis of the lumbar spine.  IMPRESSION: Dilated pancreatic and bile ducts worrisome for distal ductal obstruction which could be due to a distal stone, stricture or mass.  No other significant abnormalities.  Original Report Authenticated By: Gwynn Burly, M.D.   Dg Ercp With Sphincterotomy  10/06/2011  *RADIOLOGY REPORT*  Clinical Data: : Biliary obstruction.  ERCP   Comparison:  Ultrasound dated 10/05/2011  Technique:  Multiple spot images obtained with the fluoroscopic device and submitted for interpretation post-procedure.  ERCP was performed by Dr. Leone Payor.  Findings: There is dilatation of the bile ducts. On several images there appears to be a meniscus sign in the distal common bile duct which could be a small stone.  Balloon catheter was pulled through the ampulla.  The last image demonstrates no visible stones in the biliary tree.  IMPRESSION: Biliary dilatation.  No stones visible in the bile ducts on the last image.  These images were submitted for radiologic interpretation only. Please see the procedural report for the amount of contrast and the fluoroscopy time utilized.  Original Report Authenticated By: Gwynn Burly, M.D.   Dg Abd Acute W/chest  10/05/2011  *RADIOLOGY REPORT*  Clinical Data: Abdominal pain  ACUTE ABDOMEN SERIES (ABDOMEN 2 VIEW & CHEST 1 VIEW)  Comparison: March 30, 2011 chest x-ray  Findings: The cardiac silhouette, mediastinum, pulmonary vasculature are within normal limits.  Both lungs are clear.  The stool and bowel gas pattern is within normal limits.  There is a moderate stool burden in the rectosigmoid colon, as well as the descending colon.  No pneumoperitoneum.  There is no gross organomegaly or abnormal calcifications.  There is left convex lumbar scoliosis and degenerative changes within the lumbar spine. There is no acute bony abnormality.  IMPRESSION: Negative chest.  Normal stool and bowel gas pattern with moderate stool burden.  Original Report Authenticated By: Brandon Melnick, M.D.    Anti-infectives: Anti-infectives     Start     Dose/Rate Route Frequency Ordered Stop   10/06/11 1500   ampicillin-sulbactam (UNASYN) 1.5 g in sodium chloride 0.9 % 50 mL IVPB        1.5 g 100 mL/hr over 30 Minutes Intravenous  Once 10/06/11 1432 10/06/11 1512   10/05/11 1530   Ampicillin-Sulbactam (UNASYN) 3 g in sodium chloride 0.9 %  100 mL IVPB        3 g 100 mL/hr over 60 Minutes Intravenous  Once 10/05/11 1524 10/05/11 1843          Assessment/Plan: s/p Procedure(s): ENDOSCOPIC RETROGRADE CHOLANGIOPANCREATOGRAPHY (ERCP) SPHINCTEROTOMY Gallstone pancreatitis, acute pancreatitis clinically resolved.  Prob passed CBD stone OK to proceed with lap chole.  Indications and risks discussed with pt and all questions answered.  She desires to proceed and will go ahead today   LOS: 2 days    Rayne Loiseau T 10/07/2011

## 2011-10-07 NOTE — Progress Notes (Signed)
Patient ID: [name], female   DOB: 07/23/1928, 78 y.o.   MRN: [id number]   Pt feels fine post ERCP and sphincterotomy yesterday. Plan is for lap Chole today. We will sign off,please call is can help.

## 2011-10-08 MED ORDER — HYDROCODONE-ACETAMINOPHEN 5-325 MG PO TABS: 1.0000 | ORAL_TABLET | Freq: Three times a day (TID) | ORAL | Status: AC | PRN

## 2011-10-08 NOTE — Progress Notes (Signed)
PT NOTE   PT order received and evaluation attempted.  Pt states she knows she is a little weak but does not feel she needs PT services at this time.  Pt states she has recently recovered from TKR and has all equipment at home.  As well she has neighbors and family to assist as needed.  At pt request PT services will be d/c at this time.

## 2011-10-08 NOTE — Discharge Summary (Signed)
Admit date: 10/05/2011 Discharge date: 10/08/2011  Primary Care Physician:  [name], MD, MD   Discharge Diagnoses:         . Cholelithiasis with choledocholithiasis 10/06/2011   . Gallstone pancreatitis 10/05/2011   . Sinus tachycardia resolved.  10/05/2011   . Leukocytosis resolved 10/05/2011   . Hypertension    . Hypothyroidism    . GERD (gastroesophageal reflux disease)    . Arthritis               DISCHARGE MEDICATION: Medication List  As of 10/08/2011 11:33 AM   STOP taking these medications         estradiol 2 MG vaginal ring      hydrochlorothiazide 25 MG tablet      PHENYLEPHRINE COMPLEX PO         TAKE these medications         cetirizine 10 MG tablet   Commonly known as: ZYRTEC   Take 10 mg by mouth daily.      HYDROcodone-acetaminophen 5-325 MG per tablet   Commonly known as: NORCO   Take 1 tablet by mouth every 8 (eight) hours as needed.      levothyroxine 100 MCG tablet   Commonly known as: SYNTHROID, LEVOTHROID   Take 50-100 mcg by mouth See admin instructions. Pt takes 1/2 tab for 50 mcg dose on Tuesday,thursday,saturday,sunday. 100 mcg  On Monday, Wednesday,friday      Magnesium 250 MG Tabs   Take 250 mg by mouth every morning.      metoprolol tartrate 25 MG tablet   Commonly known as: LOPRESSOR   Take 12.5 mg by mouth every evening. Pt takes 1/2 tablet of 25 mg  for 12.5 mg dose      multivitamin per tablet   Take 1 tablet by mouth daily.      Vitamin D3 5000 UNITS Caps   Take 5,000 Units by mouth daily.              Consults:  Glenna Fellows.                     Stan Head.   SIGNIFICANT DIAGNOSTIC STUDIES:  Dg Cholangiogram Operative  10/07/2011  *RADIOLOGY REPORT*  Clinical Data:   Gallstones and stent placement.  INTRAOPERATIVE CHOLANGIOGRAM  Technique:  Cholangiographic images from the C-arm fluoroscopic device were submitted for interpretation post-operatively.  Please see the procedural report for the  amount of contrast and the fluoroscopy time utilized.  Comparison:  ERCP 10/06/2011  Findings:  Contrast is injected into cystic duct remnant.  There is opacification of the intra and extrahepatic biliary ducts.  No evidence for filling defect or stricture.  Distal duct patency is confirmed by presence of contrast in the duodenum.  IMPRESSION:  No evidence for stricture or residual stones.  Original Report Authenticated By: Patterson Hammersmith, M.D.   US Abdomen Complete  10/05/2011  The *RADIOLOGY REPORT*  Clinical Data:  Abdominal pain  ABDOMINAL ULTRASOUND COMPLETE  Comparison:  None.  Findings:  Gallbladder:  There are multiple mobile gallstones within the gallbladder lumen.  The largest measures approximately 1.3 cm. No gallbladder wall thickening or pericholecystic fluid. Negative sonographic Murphy's sign.  Common Bile Duct:   Dilated, measuring 11 millimeters. There is an 9 mm stone in the distal portion of the duct.  Liver: No focal mass lesion identified.  Within normal limits in parenchymal echogenicity. There is intrahepatic biliary ductal dilatation.  IVC:  Appears normal.  Pancreas:  There is mild pancreatic ductal dilatation.  Spleen:  Within normal limits in size and echotexture.  Right kidney:  Normal in size and parenchymal echogenicity.  No evidence of mass or hydronephrosis.  Left kidney:  Normal in size and parenchymal echogenicity.  No evidence of mass or hydronephrosis.  Abdominal Aorta:  No aneurysm identified.  A tiny right pleural effusion is noted.  IMPRESSION:  There is choledocholithiasis  with resultant common duct, intrahepatic duct, and pancreatic ductal dilatation.  Original Report Authenticated By: Brandon Melnick, M.D.   Ct Abdomen Pelvis W Contrast  10/05/2011  *RADIOLOGY REPORT*  Clinical Data: Abdominal pain.  CT ABDOMEN AND PELVIS WITH CONTRAST  Technique:  Multidetector CT imaging of the abdomen and pelvis was performed following the standard protocol during bolus  administration of intravenous contrast.  Contrast: OMNIPAQUE IOHEXOL 300 MG/ML IJ SOLN  Comparison: Radiographs dated 10/05/2011  Findings: There is dilatation of the bile ducts and of the pancreatic duct.  Common bile duct is at least 10 mm in diameter and the pancreatic duct is 10 mm in diameter.  There is no discrete definitive stone in the distal ducts there is no definitive mass of concern at the may be a stone in the distal common bile duct or a stricture or mass at the ampulla.  There is a duodenal diverticulum adjacent to the ampulla.  The gallbladder is distended.  There is slight dilatation of intrahepatic bile ducts.  Liver parenchyma is otherwise normal.  Spleen is normal.  Pancreas demonstrates diffuse dilatation of the duct without a mass.  Adrenal glands and kidneys are normal.  Terminal ileum and appendix are normal.  Uterus and ovaries are normal.  Pessary in place.  No acute osseous abnormality.  Degenerative rotoscoliosis of the lumbar spine.  IMPRESSION: Dilated pancreatic and bile ducts worrisome for distal ductal obstruction which could be due to a distal stone, stricture or mass.  No other significant abnormalities.  Original Report Authenticated By: Gwynn Burly, M.D.   Dg Ercp With Sphincterotomy  10/06/2011  *RADIOLOGY REPORT*  Clinical Data: : Biliary obstruction.  ERCP  Comparison:  Ultrasound dated 10/05/2011  Technique:  Multiple spot images obtained with the fluoroscopic device and submitted for interpretation post-procedure.  ERCP was performed by Dr. Leone Payor.  Findings: There is dilatation of the bile ducts. On several images there appears to be a meniscus sign in the distal common bile duct which could be a small stone.  Balloon catheter was pulled through the ampulla.  The last image demonstrates no visible stones in the biliary tree.  IMPRESSION: Biliary dilatation.  No stones visible in the bile ducts on the last image.  These images were submitted for radiologic  interpretation only. Please see the procedural report for the amount of contrast and the fluoroscopy time utilized.  Original Report Authenticated By: Gwynn Burly, M.D.   Dg Abd Acute W/chest  10/05/2011  *RADIOLOGY REPORT*  Clinical Data: Abdominal pain  ACUTE ABDOMEN SERIES (ABDOMEN 2 VIEW & CHEST 1 VIEW)  Comparison: March 30, 2011 chest x-ray  Findings: The cardiac silhouette, mediastinum, pulmonary vasculature are within normal limits.  Both lungs are clear.  The stool and bowel gas pattern is within normal limits.  There is a moderate stool burden in the rectosigmoid colon, as well as the descending colon.  No pneumoperitoneum.  There is no gross organomegaly or abnormal calcifications.  There is left convex lumbar scoliosis and degenerative changes within the lumbar spine. There  is no acute bony abnormality.  IMPRESSION: Negative chest.  Normal stool and bowel gas pattern with moderate stool burden.  Original Report Authenticated By: Brandon Melnick, M.D.    BRIEF ADMITTING H & P: [name] is a 78 y.o. Caucasian female with history of hypertension, hypothyroidism, history of breast cancer status post chemotherapy in early 2000s, GERD, who presented with the above complaints. Symptoms initially started on 10/03/2011 in the afternoon after having [employer] tea and light lunch. She started having sharp epigastric pain, which since then has improved but patient still has epigastric soreness. She had presented to the emergency department yesterday for further evaluation however her left before any evaluation could be performed as she thought that she could follow up with her primary care physician. She had persistent abdominal soreness initially went to primary care physician's office who were busy to work the patient in their clinic as a result she came back to the emergency department and on evaluation was found to have elevated lipase suggestive of pancreatitis an abdominal ultrasound showed  choledocholithiasis with common bile duct, intrahepatic duct, and pancreatic duct dilatation. General surgery has evaluated the patient and requested the patient be medically admitted for further management and with the plan of laparoscopic cholecystectomy prior to discharge. Patient reported that she was warm this morning, denies any chills, chest pain, shortness of breath, diarrhea, nausea, vomiting, headaches or vision changes.  Hospital Course:   Gallstone pancreatitis //Cholelithiasis with choledocholithiasis  Patient presented with abdominal pain, she was found to have increase lipase, US show   choledocholithiasis with resultant common duct,  intrahepatic duct, and pancreatic ductal dilatation. Ct abdomen show dilated pancreatic and bile ducts worrisome for distal ductal obstruction which could be due to a distal stone, stricture or mass. Gi was consulted.  Patient had ERCP on 3-1 For sphincterectomy that show : Dilated biliary tree, maximum 12mm. No stones in the bile ducts. Less clear that she has had gallstone pancreatitis now that no stone seen there. Patient had Cholecystectomy 3-2. Tolerate procedure well. No complaints. Patient feeling better, tolerating regular diet. Willing to go home. Surgery agree with discaharge home today.   Hypertension Will discharge off HCTZ due to recent pancreatitis.   Hypothyroidism  Continue with synthroid.   GERD  Continue with protonix.   Sinus tachycardia  Continue with metoprolol.   Leukocytosis  Likely secondary to pancreatitis, reactive. WBC trending down. Patient received dose Unasyn.  Mild Hyponatremia; Resolved with IV fluids.   Disposition and Follow-up:  Discharge Orders    Future Orders Please Complete By Expires   Diet - low sodium heart healthy      Increase activity slowly        Follow-up Information    Follow up with MCKEOWN,WILLIAM DAVID, MD in 1 month.   Contact information:   [address] [place] [address] [phone]       Follow up with Mariella Saa, MD. Schedule an appointment as soon as possible for a visit in 2 weeks.   Contact information:   [employer] [address], [address] [place] [address] [phone]           DISCHARGE EXAM:   General: Alert, awake, oriented x3, in no acute distress.  HEENT: No bruits, no goiter.  Heart: Regular rate and rhythm, without murmurs, rubs, gallops.  Lungs: Crackles left side, bilateral air movement.  Abdomen: Soft, nontender, nondistended, positive bowel sounds.  Neuro:  Grossly intact, nonfocal.  Extremities; no edema.    Blood pressure 110/70, pulse 88, temperature 97.9 F (36.6 C), temperature source Oral, resp. rate 16, height 5\' 4"  (1.626 m), weight 57 kg (125 lb 10.6 oz), SpO2 97.00%.   Basename 10/07/11 0414 10/06/11 0348  NA 133* 134*  K 3.7 4.0  CL 102 101  CO2 25 21  GLUCOSE 89 56*  BUN 9 13  CREATININE 0.71 0.69  CALCIUM 8.8 8.6  MG -- --  PHOS -- --    Basename 10/07/11 0414 10/06/11 0348  AST 16 15  ALT 9 9  ALKPHOS 72 72  BILITOT 0.6 0.5  PROT 5.8* 6.2  ALBUMIN 2.5* 2.8*    Basename 10/07/11 0414 10/06/11 0348  LIPASE 184* 554*  AMYLASE -- --    Basename 10/07/11 0414 10/06/11 0348 10/05/11 1205  WBC 12.3* 13.0* --  NEUTROABS -- -- 12.2*  HGB 10.3* 10.9* --  HCT 30.9* 33.2* --  MCV 87.8 88.8 --  PLT 186 205 --    Signed: Elleanor Guyett M.D. 10/08/2011, 11:33 AM

## 2011-10-08 NOTE — Progress Notes (Signed)
OT Discharge Note  Patient is being discharged from OT services secondary to:    Pt requesting NO OT services at this time   Spoke with PT [facility]) who reports no acute therapy needs and declining all therapies  Please see latest Therapy Progress Note for current level of functioning and progress toward goals.  Progress and discharge plan and discussed with patient/caregiver and they    Agree   OT TO SIGN OFF   Lucile Shutters   OTR/L Pager: [phone] Office: [phone] .

## 2011-10-08 NOTE — Progress Notes (Signed)
Patient ID: [name], female   DOB: 11-07-1927, 78 y.o.   MRN: [id number] 1 Day Post-Op  Subjective: Some dry cough.  Denies abd pain or nausea  Objective: Vital signs in last 24 hours: Temp:  [97.7 F (36.5 C)-98.7 F (37.1 C)] 97.9 F (36.6 C) (03/03 0455) Pulse Rate:  [83-100] 88  (03/03 0455) Resp:  [15-20] 16  (03/03 0455) BP: (110-145)/(57-81) 110/70 mmHg (03/03 0455) SpO2:  [93 %-100 %] 97 % (03/03 0455) Last BM Date: 10/03/11  Intake/Output from previous day: 03/02 0701 - 03/03 0700 In: 2727.8 [P.O.:480; I.V.:2237.8; IV Piggyback:10] Out: 525 [Urine:500; Blood:25] Intake/Output this shift:    General appearance: alert and no distress GI: normal findings: soft, non-tender Incision/Wound:Clean and dry  Lab Results:   Basename 10/07/11 0414 10/06/11 0348  WBC 12.3* 13.0*  HGB 10.3* 10.9*  HCT 30.9* 33.2*  PLT 186 205   BMET  Basename 10/07/11 0414 10/06/11 0348  NA 133* 134*  K 3.7 4.0  CL 102 101  CO2 25 21  GLUCOSE 89 56*  BUN 9 13  CREATININE 0.71 0.69  CALCIUM 8.8 8.6     Studies/Results: Dg Cholangiogram Operative  10/07/2011  *RADIOLOGY REPORT*  Clinical Data:   Gallstones and stent placement.  INTRAOPERATIVE CHOLANGIOGRAM  Technique:  Cholangiographic images from the C-arm fluoroscopic device were submitted for interpretation post-operatively.  Please see the procedural report for the amount of contrast and the fluoroscopy time utilized.  Comparison:  ERCP 10/06/2011  Findings:  Contrast is injected into cystic duct remnant.  There is opacification of the intra and extrahepatic biliary ducts.  No evidence for filling defect or stricture.  Distal duct patency is confirmed by presence of contrast in the duodenum.  IMPRESSION:  No evidence for stricture or residual stones.  Original Report Authenticated By: Patterson Hammersmith, M.D.   Dg Ercp With Sphincterotomy  10/06/2011  *RADIOLOGY REPORT*  Clinical Data: : Biliary obstruction.  ERCP  Comparison:   Ultrasound dated 10/05/2011  Technique:  Multiple spot images obtained with the fluoroscopic device and submitted for interpretation post-procedure.  ERCP was performed by Dr. Leone Payor.  Findings: There is dilatation of the bile ducts. On several images there appears to be a meniscus sign in the distal common bile duct which could be a small stone.  Balloon catheter was pulled through the ampulla.  The last image demonstrates no visible stones in the biliary tree.  IMPRESSION: Biliary dilatation.  No stones visible in the bile ducts on the last image.  These images were submitted for radiologic interpretation only. Please see the procedural report for the amount of contrast and the fluoroscopy time utilized.  Original Report Authenticated By: Gwynn Burly, M.D.    Anti-infectives: Anti-infectives     Start     Dose/Rate Route Frequency Ordered Stop   10/07/11 0814   ceFAZolin (ANCEF) IVPB 1 g/50 mL premix        1 g 100 mL/hr over 30 Minutes Intravenous 60 min pre-op 10/07/11 0814 10/07/11 1001   10/06/11 1500   ampicillin-sulbactam (UNASYN) 1.5 g in sodium chloride 0.9 % 50 mL IVPB        1.5 g 100 mL/hr over 30 Minutes Intravenous  Once 10/06/11 1432 10/06/11 1512   10/05/11 1530   Ampicillin-Sulbactam (UNASYN) 3 g in sodium chloride 0.9 % 100 mL IVPB        3 g 100 mL/hr over 60 Minutes Intravenous  Once 10/05/11 1524 10/05/11 1843  Assessment/Plan: s/p Procedure(s): LAPAROSCOPIC CHOLECYSTECTOMY WITH INTRAOPERATIVE CHOLANGIOGRAM Doing well post op. Looks OK for discharge. F/U office in two weeks   LOS: 3 days    Jadon Harbaugh T 10/08/2011

## 2011-10-08 NOTE — Discharge Instructions (Signed)
[facility] ______[facility] °LAPAROSCOPIC SURGERY: POST OP INSTRUCTIONS °Always review your discharge instruction sheet given to you by the facility where your surgery was performed. °IF YOU HAVE DISABILITY OR FAMILY LEAVE FORMS, YOU MUST BRING THEM TO THE OFFICE FOR PROCESSING.   °DO NOT GIVE THEM TO YOUR DOCTOR. ° °1. A prescription for pain medication may be given to you upon discharge.  Take your pain medication as prescribed, if needed.  If narcotic pain medicine is not needed, then you may take acetaminophen (Tylenol) or ibuprofen (Advil) as needed. °2. Take your usually prescribed medications unless otherwise directed. °3. If you need a refill on your pain medication, please contact your pharmacy.  They will contact our office to request authorization. Prescriptions will not be filled after 5pm or on week-ends. °4. You should follow a light diet the first few days after arrival home, such as soup and crackers, etc.  Be sure to include lots of fluids daily. °5. Most patients will experience some swelling and bruising in the area of the incisions.  Ice packs will help.  Swelling and bruising can take several days to resolve.  °6. It is common to experience some constipation if taking pain medication after surgery.  Increasing fluid intake and taking a stool softener (such as Colace) will usually help or prevent this problem from occurring.  A mild laxative (Milk of Magnesia or Miralax) should be taken according to package instructions if there are no bowel movements after 48 hours. °7. Unless discharge instructions indicate otherwise, you may remove your bandages 24-48 hours after surgery, and you may shower at that time.  You may have steri-strips (small skin tapes) in place directly over the incision.  These strips should be left on the skin for 7-10 days.  If your surgeon used skin glue on the incision, you may shower in 24 hours.  The glue will flake off over the next 2-3 weeks.  Any sutures or  staples will be removed at the office during your follow-up visit. °8. ACTIVITIES:  You may resume regular (light) daily activities beginning the next day--such as daily self-care, walking, climbing stairs--gradually increasing activities as tolerated.  You may have sexual intercourse when it is comfortable.  Refrain from any heavy lifting or straining until approved by your doctor. °a. You may drive when you are no longer taking prescription pain medication, you can comfortably wear a seatbelt, and you can safely maneuver your car and apply brakes. °b. RETURN TO WORK:  __________________________________________________________ °9. You should see your doctor in the office for a follow-up appointment approximately 2-3 weeks after your surgery.  Make sure that you call for this appointment within a day or two after you arrive home to insure a convenient appointment time. °10. OTHER INSTRUCTIONS: __________________________________________________________________________________________________________________________ __________________________________________________________________________________________________________________________ °WHEN TO CALL YOUR DOCTOR: °1. Fever over 101.0 °2. Inability to urinate °3. Continued bleeding from incision. °4. Increased pain, redness, or drainage from the incision. °5. Increasing abdominal pain ° °The clinic staff is available to answer your questions during regular business hours.  Please don’t hesitate to call and ask to speak to one of the nurses for clinical concerns.  If you have a medical emergency, go to the nearest emergency room or call 911.  A surgeon from [facility] is always on call at the hospital. °[address], [address], [place], [place]  [address] ? [address] [address], [place], [place]   [address] °[phone] ? [phone] ? FAX [phone] °Web site:   [email or web address] °

## 2011-10-09 ENCOUNTER — Encounter (HOSPITAL_COMMUNITY): Payer: Self-pay | Admitting: Internal Medicine

## 2011-10-11 ENCOUNTER — Encounter (HOSPITAL_COMMUNITY): Payer: Self-pay | Admitting: General Surgery

## 2011-10-26 ENCOUNTER — Encounter (INDEPENDENT_AMBULATORY_CARE_PROVIDER_SITE_OTHER): Payer: Self-pay | Admitting: General Surgery

## 2011-10-26 ENCOUNTER — Ambulatory Visit (INDEPENDENT_AMBULATORY_CARE_PROVIDER_SITE_OTHER): Payer: Medicare Other | Admitting: General Surgery

## 2011-10-26 VITALS — BP 118/74 | HR 60 | Temp 97.0°F | Resp 14 | Ht 63.5 in | Wt 121.1 lb

## 2011-10-26 DIAGNOSIS — Z09 Encounter for follow-up examination after completed treatment for conditions other than malignant neoplasm: Secondary | ICD-10-CM

## 2011-10-26 NOTE — Progress Notes (Signed)
History: Patient returns roughly 2 weeks following urgent laparoscopic cholecystectomy and cholangiogram after an episode of acute gallstone pancreatitis. She reports she is doing well. She has no abnormal pain or GI complaints.  Exam: She appears well. Abdomen is soft and nontender. Wounds are healing well.  Assessment and plan: Doing well following arthroscopic cholecystectomy and cholangiogram, status post ERCP after episode of acute gallstone pancreatitis. No complications identified. She was discharged without restrictions to return as needed the

## 2012-04-05 ENCOUNTER — Other Ambulatory Visit (HOSPITAL_COMMUNITY): Payer: Self-pay | Admitting: Internal Medicine

## 2012-04-05 DIAGNOSIS — Z1231 Encounter for screening mammogram for malignant neoplasm of breast: Secondary | ICD-10-CM

## 2012-04-18 ENCOUNTER — Ambulatory Visit (HOSPITAL_COMMUNITY)
Admission: RE | Admit: 2012-04-18 | Discharge: 2012-04-18 | Disposition: A | Discharge status: Home / Self Care | Payer: Medicare Other | Source: Ambulatory Visit | Attending: Internal Medicine | Admitting: Internal Medicine

## 2012-04-18 DIAGNOSIS — Z1231 Encounter for screening mammogram for malignant neoplasm of breast: Secondary | ICD-10-CM

## 2012-05-15 ENCOUNTER — Other Ambulatory Visit: Payer: Self-pay | Admitting: Neurosurgery

## 2012-05-16 ENCOUNTER — Encounter (HOSPITAL_COMMUNITY): Payer: Self-pay | Admitting: Pharmacy Technician

## 2012-05-22 ENCOUNTER — Encounter (HOSPITAL_COMMUNITY)
Admission: RE | Admit: 2012-05-22 | Discharge: 2012-05-22 | Disposition: A | Discharge status: Home / Self Care | Payer: Medicare Other | Source: Ambulatory Visit | Attending: Neurosurgery | Admitting: Neurosurgery

## 2012-05-22 ENCOUNTER — Encounter (HOSPITAL_COMMUNITY): Payer: Self-pay

## 2012-05-22 HISTORY — DX: Adverse effect of unspecified anesthetic, initial encounter: T41.45XA

## 2012-05-22 HISTORY — DX: Cardiac arrhythmia, unspecified: I49.9

## 2012-05-22 HISTORY — DX: Hypothyroidism, unspecified: E03.9

## 2012-05-22 HISTORY — DX: Frequency of micturition: R35.0

## 2012-05-22 HISTORY — DX: Other seasonal allergic rhinitis: J30.2

## 2012-05-22 HISTORY — DX: Other complications of anesthesia, initial encounter: T88.59XA

## 2012-05-22 HISTORY — DX: Dizziness and giddiness: R42

## 2012-05-22 LAB — BASIC METABOLIC PANEL
BUN: 22 mg/dL (ref 6–23)
CO2: 31 mEq/L (ref 19–32)
Calcium: 10.2 mg/dL (ref 8.4–10.5)
Chloride: 96 mEq/L (ref 96–112)
Creatinine, Ser: 0.78 mg/dL (ref 0.50–1.10)
Glucose, Bld: 95 mg/dL (ref 70–99)

## 2012-05-22 LAB — SURGICAL PCR SCREEN: Staphylococcus aureus: NEGATIVE

## 2012-05-22 LAB — CBC
HCT: 40.1 % (ref 36.0–46.0)
Hemoglobin: 13.6 g/dL (ref 12.0–15.0)
MCH: 32.9 pg (ref 26.0–34.0)
MCHC: 33.9 g/dL (ref 30.0–36.0)
MCV: 96.9 fL (ref 78.0–100.0)
Platelets: 234 10*3/uL (ref 150–400)
RBC: 4.14 MIL/uL (ref 3.87–5.11)
RDW: 13.4 % (ref 11.5–15.5)
WBC: 10.6 10*3/uL — ABNORMAL HIGH (ref 4.0–10.5)

## 2012-05-22 NOTE — Pre-Procedure Instructions (Signed)
[address] Desire  05/22/2012   Your procedure is scheduled on:  Monday May 27, 2012.  Report to [facility] at 0845 AM.  Call this number if you have problems the morning of surgery: [phone]   Remember:   Do not eat food or drink:After Midnight.    Take these medicines the morning of surgery with A SIP OF WATER: Levothyroxine (Synthroid)   Discontinue aspirin, Coumadin, Plavix, Effient, and herbal medications 7 days prior to surgery    Do not wear jewelry, make-up or nail polish.  Do not wear lotions, powders, or perfumes.   Do not shave 48 hours prior to surgery.   Do not bring valuables to the hospital.  Contacts, dentures or bridgework may not be worn into surgery.  Leave suitcase in the car. After surgery it may be brought to your room.  For patients admitted to the hospital, checkout time is 11:00 AM the day of discharge.   Patients discharged the day of surgery will not be allowed to drive home.  Name and phone number of your driver:   Special Instructions: Shower using CHG 2 nights before surgery and the night before surgery.  If you shower the day of surgery use CHG.  Use special wash - you have one bottle of CHG for all showers.  You should use approximately 1/3 of the bottle for each shower.   Please read over the following fact sheets that you were given: Pain Booklet, Coughing and Deep Breathing, MRSA Information and Surgical Site Infection Prevention

## 2012-05-22 NOTE — Progress Notes (Signed)
Patient informed Nurse that she had a stress test several years ago and her PCP Dr. Oneta Rack would have those results. Nurse requested LOV, Stress test, and EKG results from office. Patient denied having a cardiac cath or sleep study.

## 2012-05-23 NOTE — Consult Note (Signed)
Anesthesia chart review: Patient is an 78 year old female scheduled for L5 laminectomy with bilateral L3 laminotomies by Dr. Lovell Sheehan on 05/27/2012. History includes former smoker, hypertension, breast cancer s/p right mastectomy '01, hypothyroidism, arthritis, GERD, mild sinus tachycardia.  PCP is Dr. Lucky Cowboy.  Her anesthesia history she reports she is very difficult to wake up after anesthesia.  She is status post cholecystectomy on 10/07/11 for cholelithiasis with gallstone pancreatitis.  EKG on 12/19/11 (PCP) showed NSR, borderline LAD.  She has a history of a normal cardiac MUGA scan, but that was back in 2001.  Labs noted.  CXR on 05/22/12 showed COPD changes with no acute anomalies.  If no acute changes in her status then anticipate she can proceed as planned.  Shonna Chock, PA-C

## 2012-05-26 MED ORDER — CEFAZOLIN SODIUM-DEXTROSE 2-3 GM-% IV SOLR
2.0000 g | INTRAVENOUS | Status: AC
  Administered 2012-05-27: 2 g via INTRAVENOUS
  Filled 2012-05-26: qty 50

## 2012-05-27 ENCOUNTER — Encounter (HOSPITAL_COMMUNITY): Payer: Self-pay | Admitting: Vascular Surgery

## 2012-05-27 ENCOUNTER — Inpatient Hospital Stay (HOSPITAL_COMMUNITY)
Admission: RE | Admit: 2012-05-27 | Discharge: 2012-05-28 | DRG: 491 | Disposition: A | Discharge status: Home / Self Care | Payer: Medicare Other | Source: Ambulatory Visit | Attending: Neurosurgery | Admitting: Neurosurgery

## 2012-05-27 ENCOUNTER — Inpatient Hospital Stay (HOSPITAL_COMMUNITY): Payer: Medicare Other | Admitting: Vascular Surgery

## 2012-05-27 ENCOUNTER — Encounter (HOSPITAL_COMMUNITY)
Admission: RE | Disposition: A | Discharge status: Home / Self Care | Payer: Self-pay | Source: Ambulatory Visit | Attending: Neurosurgery

## 2012-05-27 ENCOUNTER — Inpatient Hospital Stay (HOSPITAL_COMMUNITY): Payer: Medicare Other

## 2012-05-27 DIAGNOSIS — Z87891 Personal history of nicotine dependence: Secondary | ICD-10-CM

## 2012-05-27 DIAGNOSIS — E039 Hypothyroidism, unspecified: Secondary | ICD-10-CM | POA: Diagnosis present

## 2012-05-27 DIAGNOSIS — I10 Essential (primary) hypertension: Secondary | ICD-10-CM | POA: Diagnosis present

## 2012-05-27 DIAGNOSIS — M4120 Other idiopathic scoliosis, site unspecified: Secondary | ICD-10-CM | POA: Diagnosis present

## 2012-05-27 DIAGNOSIS — Z96659 Presence of unspecified artificial knee joint: Secondary | ICD-10-CM

## 2012-05-27 DIAGNOSIS — K219 Gastro-esophageal reflux disease without esophagitis: Secondary | ICD-10-CM | POA: Diagnosis present

## 2012-05-27 DIAGNOSIS — Z853 Personal history of malignant neoplasm of breast: Secondary | ICD-10-CM

## 2012-05-27 DIAGNOSIS — Z823 Family history of stroke: Secondary | ICD-10-CM

## 2012-05-27 DIAGNOSIS — M48062 Spinal stenosis, lumbar region with neurogenic claudication: Principal | ICD-10-CM | POA: Diagnosis present

## 2012-05-27 DIAGNOSIS — Z01812 Encounter for preprocedural laboratory examination: Secondary | ICD-10-CM

## 2012-05-27 DIAGNOSIS — Z8249 Family history of ischemic heart disease and other diseases of the circulatory system: Secondary | ICD-10-CM

## 2012-05-27 DIAGNOSIS — Z888 Allergy status to other drugs, medicaments and biological substances status: Secondary | ICD-10-CM

## 2012-05-27 DIAGNOSIS — Z803 Family history of malignant neoplasm of breast: Secondary | ICD-10-CM

## 2012-05-27 DIAGNOSIS — Z79899 Other long term (current) drug therapy: Secondary | ICD-10-CM

## 2012-05-27 DIAGNOSIS — Z9010 Acquired absence of unspecified breast and nipple: Secondary | ICD-10-CM

## 2012-05-27 HISTORY — PX: LUMBAR LAMINECTOMY/DECOMPRESSION MICRODISCECTOMY: SHX5026

## 2012-05-27 SURGERY — LUMBAR LAMINECTOMY/DECOMPRESSION MICRODISCECTOMY 1 LEVEL
Anesthesia: General | Site: Spine Lumbar | Laterality: Bilateral | Wound class: Clean

## 2012-05-27 MED ORDER — FENTANYL CITRATE 0.05 MG/ML IJ SOLN
INTRAMUSCULAR | Status: DC | PRN
  Administered 2012-05-27: 50 ug via INTRAVENOUS
  Administered 2012-05-27: 100 ug via INTRAVENOUS

## 2012-05-27 MED ORDER — MORPHINE SULFATE 2 MG/ML IJ SOLN: 1.0000 mg | INTRAMUSCULAR | Status: DC | PRN

## 2012-05-27 MED ORDER — ACETAMINOPHEN 325 MG PO TABS
650.0000 mg | ORAL_TABLET | ORAL | Status: DC | PRN
  Administered 2012-05-28: 650 mg via ORAL
  Filled 2012-05-27: qty 2

## 2012-05-27 MED ORDER — VITAMIN D3 25 MCG (1000 UNIT) PO TABS
2000.0000 [IU] | ORAL_TABLET | Freq: Two times a day (BID) | ORAL | Status: DC
  Administered 2012-05-27: 2000 [IU] via ORAL
  Filled 2012-05-27 (×3): qty 2

## 2012-05-27 MED ORDER — PHENOL 1.4 % MT LIQD: 1.0000 | OROMUCOSAL | Status: DC | PRN

## 2012-05-27 MED ORDER — GLYCOPYRROLATE 0.2 MG/ML IJ SOLN
INTRAMUSCULAR | Status: DC | PRN
  Administered 2012-05-27: .5 mg via INTRAVENOUS

## 2012-05-27 MED ORDER — MULTIVITAMINS PO TABS: 1.0000 | ORAL_TABLET | Freq: Every day | ORAL | Status: DC

## 2012-05-27 MED ORDER — ADULT MULTIVITAMIN W/MINERALS CH
1.0000 | ORAL_TABLET | Freq: Every day | ORAL | Status: DC
  Filled 2012-05-27: qty 1

## 2012-05-27 MED ORDER — HYDROMORPHONE HCL PF 1 MG/ML IJ SOLN: 0.2500 mg | INTRAMUSCULAR | Status: DC | PRN

## 2012-05-27 MED ORDER — LEVOTHYROXINE SODIUM 100 MCG PO TABS
100.0000 ug | ORAL_TABLET | ORAL | Status: DC
  Filled 2012-05-27: qty 1

## 2012-05-27 MED ORDER — THROMBIN 5000 UNITS EX KIT
PACK | CUTANEOUS | Status: DC | PRN
  Administered 2012-05-27 (×2): 5000 [IU] via TOPICAL

## 2012-05-27 MED ORDER — EPHEDRINE SULFATE 50 MG/ML IJ SOLN
INTRAMUSCULAR | Status: DC | PRN
  Administered 2012-05-27 (×2): 10 mg via INTRAVENOUS

## 2012-05-27 MED ORDER — MAGNESIUM 250 MG PO TABS: 250.0000 mg | ORAL_TABLET | Freq: Every morning | ORAL | Status: DC

## 2012-05-27 MED ORDER — BACITRACIN 50000 UNITS IM SOLR
INTRAMUSCULAR | Status: AC
  Filled 2012-05-27: qty 1

## 2012-05-27 MED ORDER — DIAZEPAM 2 MG PO TABS: 2.0000 mg | ORAL_TABLET | Freq: Four times a day (QID) | ORAL | Status: DC | PRN

## 2012-05-27 MED ORDER — DEXTROSE 5 % IV SOLN
INTRAVENOUS | Status: DC | PRN
  Administered 2012-05-27: 13:00:00 via INTRAVENOUS

## 2012-05-27 MED ORDER — MAGNESIUM CHLORIDE 64 MG PO TBEC
1.0000 | DELAYED_RELEASE_TABLET | Freq: Every day | ORAL | Status: DC
  Filled 2012-05-27: qty 1

## 2012-05-27 MED ORDER — CEFAZOLIN SODIUM-DEXTROSE 2-3 GM-% IV SOLR
2.0000 g | Freq: Three times a day (TID) | INTRAVENOUS | Status: AC
  Administered 2012-05-27 – 2012-05-28 (×2): 2 g via INTRAVENOUS
  Filled 2012-05-27 (×2): qty 50

## 2012-05-27 MED ORDER — 0.9 % SODIUM CHLORIDE (POUR BTL) OPTIME
TOPICAL | Status: DC | PRN
  Administered 2012-05-27: 1000 mL

## 2012-05-27 MED ORDER — SODIUM CHLORIDE 0.9 % IV SOLN
INTRAVENOUS | Status: AC
  Filled 2012-05-27: qty 500

## 2012-05-27 MED ORDER — NEOSTIGMINE METHYLSULFATE 1 MG/ML IJ SOLN
INTRAMUSCULAR | Status: DC | PRN
  Administered 2012-05-27: 3 mg via INTRAVENOUS

## 2012-05-27 MED ORDER — MENTHOL 3 MG MT LOZG: 1.0000 | LOZENGE | OROMUCOSAL | Status: DC | PRN

## 2012-05-27 MED ORDER — BUPIVACAINE-EPINEPHRINE PF 0.5-1:200000 % IJ SOLN
INTRAMUSCULAR | Status: DC | PRN
  Administered 2012-05-27: 10 mL

## 2012-05-27 MED ORDER — SODIUM CHLORIDE 0.9 % IR SOLN
Status: DC | PRN
  Administered 2012-05-27: 13:00:00

## 2012-05-27 MED ORDER — METOPROLOL TARTRATE 25 MG PO TABS
25.0000 mg | ORAL_TABLET | Freq: Every evening | ORAL | Status: DC
  Administered 2012-05-27: 25 mg via ORAL
  Filled 2012-05-27 (×2): qty 1

## 2012-05-27 MED ORDER — LEVOTHYROXINE SODIUM 50 MCG PO TABS: 50.0000 ug | ORAL_TABLET | ORAL | Status: DC

## 2012-05-27 MED ORDER — PHENYLEPHRINE HCL 10 MG/ML IJ SOLN
20.0000 mg | INTRAVENOUS | Status: DC | PRN
  Administered 2012-05-27: 50 ug/min via INTRAVENOUS

## 2012-05-27 MED ORDER — PROPOFOL 10 MG/ML IV BOLUS
INTRAVENOUS | Status: DC | PRN
  Administered 2012-05-27: 140 mg via INTRAVENOUS

## 2012-05-27 MED ORDER — ACETAMINOPHEN 650 MG RE SUPP: 650.0000 mg | RECTAL | Status: DC | PRN

## 2012-05-27 MED ORDER — ONDANSETRON HCL 4 MG/2ML IJ SOLN: 4.0000 mg | INTRAMUSCULAR | Status: DC | PRN

## 2012-05-27 MED ORDER — DOCUSATE SODIUM 100 MG PO CAPS
100.0000 mg | ORAL_CAPSULE | Freq: Two times a day (BID) | ORAL | Status: DC
  Administered 2012-05-27: 100 mg via ORAL
  Filled 2012-05-27: qty 1

## 2012-05-27 MED ORDER — ONDANSETRON HCL 4 MG/2ML IJ SOLN
INTRAMUSCULAR | Status: DC | PRN
  Administered 2012-05-27: 4 mg via INTRAVENOUS

## 2012-05-27 MED ORDER — LIDOCAINE HCL (CARDIAC) 20 MG/ML IV SOLN
INTRAVENOUS | Status: DC | PRN
  Administered 2012-05-27: 70 mg via INTRAVENOUS

## 2012-05-27 MED ORDER — HEMOSTATIC AGENTS (NO CHARGE) OPTIME
TOPICAL | Status: DC | PRN
  Administered 2012-05-27: 1 via TOPICAL

## 2012-05-27 MED ORDER — VITAMIN D 50 MCG (2000 UT) PO CAPS: 1.0000 | ORAL_CAPSULE | Freq: Two times a day (BID) | ORAL | Status: DC

## 2012-05-27 MED ORDER — OXYCODONE-ACETAMINOPHEN 5-325 MG PO TABS: 1.0000 | ORAL_TABLET | ORAL | Status: DC | PRN

## 2012-05-27 MED ORDER — LEVOTHYROXINE SODIUM 50 MCG PO TABS
50.0000 ug | ORAL_TABLET | ORAL | Status: DC
  Administered 2012-05-28: 50 ug via ORAL
  Filled 2012-05-27: qty 1

## 2012-05-27 MED ORDER — LACTATED RINGERS IV SOLN
INTRAVENOUS | Status: DC | PRN
  Administered 2012-05-27 (×2): via INTRAVENOUS

## 2012-05-27 MED ORDER — ZOLPIDEM TARTRATE 5 MG PO TABS: 5.0000 mg | ORAL_TABLET | Freq: Every evening | ORAL | Status: DC | PRN

## 2012-05-27 MED ORDER — ARTIFICIAL TEARS OP OINT
TOPICAL_OINTMENT | OPHTHALMIC | Status: DC | PRN
  Administered 2012-05-27: 1 via OPHTHALMIC

## 2012-05-27 MED ORDER — HYDROCODONE-ACETAMINOPHEN 5-325 MG PO TABS: 1.0000 | ORAL_TABLET | ORAL | Status: DC | PRN

## 2012-05-27 MED ORDER — BACITRACIN ZINC 500 UNIT/GM EX OINT
TOPICAL_OINTMENT | CUTANEOUS | Status: DC | PRN
  Administered 2012-05-27: 1 via TOPICAL

## 2012-05-27 MED ORDER — ROCURONIUM BROMIDE 100 MG/10ML IV SOLN
INTRAVENOUS | Status: DC | PRN
  Administered 2012-05-27: 50 mg via INTRAVENOUS

## 2012-05-27 MED ORDER — LACTATED RINGERS IV SOLN: INTRAVENOUS | Status: DC

## 2012-05-27 SURGICAL SUPPLY — 59 items
APL SKNCLS STERI-STRIP NONHPOA (GAUZE/BANDAGES/DRESSINGS) ×1
BACITRACIN IRRIGATION ×1 IMPLANT
BAG DECANTER FOR FLEXI CONT (MISCELLANEOUS) ×2 IMPLANT
BENZOIN TINCTURE PRP APPL 2/3 (GAUZE/BANDAGES/DRESSINGS) ×2 IMPLANT
BLADE SURG ROTATE 9660 (MISCELLANEOUS) IMPLANT
BRUSH SCRUB EZ PLAIN DRY (MISCELLANEOUS) ×2 IMPLANT
BUR ACORN 6.0 (BURR) ×2 IMPLANT
BUR MATCHSTICK NEURO 3.0 LAGG (BURR) ×2 IMPLANT
CANISTER SUCTION 2500CC (MISCELLANEOUS) ×2 IMPLANT
CLOTH BEACON ORANGE TIMEOUT ST (SAFETY) ×2 IMPLANT
CONT SPEC 4OZ CLIKSEAL STRL BL (MISCELLANEOUS) ×2 IMPLANT
DRAPE LAPAROTOMY 100X72X124 (DRAPES) ×2 IMPLANT
DRAPE MICROSCOPE LEICA (MISCELLANEOUS) ×3 IMPLANT
DRAPE POUCH INSTRU U-SHP 10X18 (DRAPES) ×2 IMPLANT
DRAPE SURG 17X23 STRL (DRAPES) ×8 IMPLANT
ELECT BLADE 4.0 EZ CLEAN MEGAD (MISCELLANEOUS) ×2
ELECT REM PT RETURN 9FT ADLT (ELECTROSURGICAL) ×2
ELECTRODE BLDE 4.0 EZ CLN MEGD (MISCELLANEOUS) ×1 IMPLANT
ELECTRODE REM PT RTRN 9FT ADLT (ELECTROSURGICAL) ×1 IMPLANT
GAUZE SPONGE 4X4 16PLY XRAY LF (GAUZE/BANDAGES/DRESSINGS) IMPLANT
GLOVE BIO SURGEON STRL SZ8.5 (GLOVE) ×2 IMPLANT
GLOVE BIOGEL PI IND STRL 8.5 (GLOVE) IMPLANT
GLOVE BIOGEL PI INDICATOR 8.5 (GLOVE) ×2
GLOVE ECLIPSE 8.5 STRL (GLOVE) ×2 IMPLANT
GLOVE EXAM NITRILE LRG STRL (GLOVE) IMPLANT
GLOVE EXAM NITRILE MD LF STRL (GLOVE) IMPLANT
GLOVE EXAM NITRILE XL STR (GLOVE) IMPLANT
GLOVE EXAM NITRILE XS STR PU (GLOVE) IMPLANT
GLOVE INDICATOR 7.0 STRL GRN (GLOVE) ×2 IMPLANT
GLOVE SS BIOGEL STRL SZ 6.5 (GLOVE) IMPLANT
GLOVE SS BIOGEL STRL SZ 8 (GLOVE) ×1 IMPLANT
GLOVE SUPERSENSE BIOGEL SZ 6.5 (GLOVE) ×2
GLOVE SUPERSENSE BIOGEL SZ 8 (GLOVE) ×1
GOWN BRE IMP SLV AUR LG STRL (GOWN DISPOSABLE) ×1 IMPLANT
GOWN BRE IMP SLV AUR XL STRL (GOWN DISPOSABLE) ×2 IMPLANT
GOWN STRL REIN 2XL LVL4 (GOWN DISPOSABLE) ×1 IMPLANT
KIT BASIN OR (CUSTOM PROCEDURE TRAY) ×2 IMPLANT
KIT ROOM TURNOVER OR (KITS) ×2 IMPLANT
NDL HYPO 21X1.5 SAFETY (NEEDLE) IMPLANT
NEEDLE HYPO 21X1.5 SAFETY (NEEDLE) IMPLANT
NEEDLE HYPO 22GX1.5 SAFETY (NEEDLE) ×2 IMPLANT
NS IRRIG 1000ML POUR BTL (IV SOLUTION) ×2 IMPLANT
PACK LAMINECTOMY NEURO (CUSTOM PROCEDURE TRAY) ×2 IMPLANT
PAD ARMBOARD 7.5X6 YLW CONV (MISCELLANEOUS) ×6 IMPLANT
PATTIES SURGICAL .5 X1 (DISPOSABLE) IMPLANT
RUBBERBAND STERILE (MISCELLANEOUS) ×2 IMPLANT
SPONGE GAUZE 4X4 12PLY (GAUZE/BANDAGES/DRESSINGS) ×2 IMPLANT
SPONGE SURGIFOAM ABS GEL SZ50 (HEMOSTASIS) ×2 IMPLANT
STRIP CLOSURE SKIN 1/2X4 (GAUZE/BANDAGES/DRESSINGS) ×2 IMPLANT
SUT VIC AB 1 CT1 18XBRD ANBCTR (SUTURE) ×1 IMPLANT
SUT VIC AB 1 CT1 8-18 (SUTURE) ×2
SUT VIC AB 2-0 CP2 18 (SUTURE) ×2 IMPLANT
SYR 20CC LL (SYRINGE) IMPLANT
SYR 20ML ECCENTRIC (SYRINGE) ×2 IMPLANT
TAPE CLOTH SURG 4X10 WHT LF (GAUZE/BANDAGES/DRESSINGS) ×1 IMPLANT
THROMBIN 5,000 UNITS ×2 IMPLANT
TOWEL OR 17X24 6PK STRL BLUE (TOWEL DISPOSABLE) ×2 IMPLANT
TOWEL OR 17X26 10 PK STRL BLUE (TOWEL DISPOSABLE) ×2 IMPLANT
WATER STERILE IRR 1000ML POUR (IV SOLUTION) ×2 IMPLANT

## 2012-05-27 NOTE — Preoperative (Addendum)
Beta Blockers   Pt took Lopressor on 05/26/12 @ 2000

## 2012-05-27 NOTE — Anesthesia Procedure Notes (Signed)
Procedure Name: Intubation Date/Time: 05/27/2012 12:45 PM Performed by: Tyrone Nine Pre-anesthesia Checklist: Emergency Drugs available, Suction available, Patient being monitored, Timeout performed and Patient identified Patient Re-evaluated:Patient Re-evaluated prior to inductionOxygen Delivery Method: Circle system utilized Preoxygenation: Pre-oxygenation with 100% oxygen Intubation Type: IV induction Ventilation: Mask ventilation without difficulty Laryngoscope Size: 3 and Mac Grade View: Grade II Number of attempts: 1 Airway Equipment and Method: Stylet Placement Confirmation: ETT inserted through vocal cords under direct vision,  positive ETCO2,  CO2 detector and breath sounds checked- equal and bilateral Secured at: 20 cm Tube secured with: Tape Dental Injury: Teeth and Oropharynx as per pre-operative assessment

## 2012-05-27 NOTE — Transfer of Care (Signed)
Immediate Anesthesia Transfer of Care Note  Patient: [name]  Procedure(s) Performed: Procedure(s) (LRB) with comments: LUMBAR LAMINECTOMY/DECOMPRESSION MICRODISCECTOMY 1 LEVEL (Bilateral) - Lumbar four laminectomy, with bilateral lumbar three laminotomies.  Patient Location: PACU  Anesthesia Type: General  Level of Consciousness: awake, alert , oriented and patient cooperative  Airway & Oxygen Therapy: Patient Spontanous Breathing and Patient connected to nasal cannula oxygen  Post-op Assessment: Report given to PACU RN and Post -op Vital signs reviewed and stable  Post vital signs: Reviewed and stable  Complications: No apparent anesthesia complications

## 2012-05-27 NOTE — H&P (Signed)
Subjective: The patient is an 78 year old white female who has been having back and left leg pain for about 6 months. She has failed medical management and was worked up with a lumbar MRI. This demonstrated the patient's spinal stenosis at L3-4 and L4-5. I discussed the various treatment options with the patient including surgery. The patient has weighed the risks, benefits, and alternatives surgery and decided proceed with a decompressive L3 and L4 laminectomy.   Past Medical History  Diagnosis Date  . Hypertension   . Arthritis   . Thyroid disease   . GERD (gastroesophageal reflux disease)   . Breast cancer   . Complication of anesthesia     Very hard to wake up after anesthesia  . Hypothyroidism   . Dizziness   . Seasonal allergies   . Frequency of urination   . Dysrhythmia     Sinus tachycardia can get up to 108. Started after chemo     Past Surgical History  Procedure Date  . Joint replacement     right knee  . Carpal tunnel release     bilateral  . Incontinence surgery   . Cataract extraction     left  . Mastectomy     right due to cancer  . Ercp 10/06/2011    Procedure: ENDOSCOPIC RETROGRADE CHOLANGIOPANCREATOGRAPHY (ERCP);  Surgeon: Iva Boop, MD;  Location: [facility];  Service: Endoscopy;  Laterality: N/A;  or general per anesthesia  . Sphincterotomy 10/06/2011    Procedure: SPHINCTEROTOMY;  Surgeon: Iva Boop, MD;  Location: [facility];  Service: Endoscopy;;  . Cholecystectomy 10/07/2011    Procedure: LAPAROSCOPIC CHOLECYSTECTOMY WITH INTRAOPERATIVE CHOLANGIOGRAM;  Surgeon: Mariella Saa, MD;  Location: [facility];  Service: General;  Laterality: N/A;  . Tonsillectomy   . Colonoscopy   . Eye surgery     Allergies  Allergen Reactions  . Nsaids Other (See Comments)    "irritates my stomach"    History  Substance Use Topics  . Smoking status: Former Games developer  . Smokeless tobacco: Never Used   Comment: Quit 50 years ago.  . Alcohol Use: Yes   occasionally; socially; wine    Family History  Problem Relation Age of Onset  . Stroke Mother   . Breast cancer Mother   . Heart disease Mother   . Stroke Father   . Heart disease Father   . Breast cancer Daughter    Prior to Admission medications   Medication Sig Start Date End Date Taking? Authorizing Provider  Cholecalciferol (VITAMIN D) 2000 UNITS CAPS Take 1 capsule by mouth 2 (two) times daily.   Yes Historical Provider, MD  estradiol (ESTRING) 2 MG vaginal ring Place 2 mg vaginally every 3 (three) months. follow package directions   Yes Historical Provider, MD  hydrochlorothiazide (HYDRODIURIL) 25 MG tablet Take 25 mg by mouth daily as needed.   Yes Historical Provider, MD  levothyroxine (SYNTHROID, LEVOTHROID) 100 MCG tablet Take 50-100 mcg by mouth See admin instructions. Pt takes 1/2 tab for 50 mcg dose on Tuesday,thursday,saturday,sunday. 100 mcg  On Monday, Wednesday,friday   Yes Historical Provider, MD  Magnesium 250 MG TABS Take 250 mg by mouth every morning.   Yes Historical Provider, MD  metoprolol tartrate (LOPRESSOR) 25 MG tablet Take 25 mg by mouth every evening.    Yes Historical Provider, MD  multivitamin [facility]) per tablet Take 1 tablet by mouth daily.   Yes Historical Provider, MD  Omega-3 Fatty Acids (FISH OIL) 1200 MG CAPS Take 1  capsule by mouth 2 (two) times daily.   Yes Historical Provider, MD     Review of Systems  Positive ROS: As above  All other systems have been reviewed and were otherwise negative with the exception of those mentioned in the HPI and as above.  Objective: Vital signs in last 24 hours: Temp:  [97.8 F (36.6 C)] 97.8 F (36.6 C) (10/21 0904) Pulse Rate:  [66] 66  (10/21 0904) Resp:  [18] 18  (10/21 0904) BP: (126)/(70) 126/70 mmHg (10/21 0904) SpO2:  [97 %] 97 % (10/21 0904)  General Appearance: Alert, cooperative, no distress, appears stated age Head: Normocephalic, without obvious abnormality, atraumatic Eyes: PERRL,  conjunctiva/corneas clear, EOM's intact, fundi benign, both eyes      Ears: Normal TM's and external ear canals, both ears Throat: Lips, mucosa, and tongue normal; teeth and gums normal Neck: Supple, symmetrical, trachea midline, no adenopathy; thyroid: No enlargement/tenderness/nodules; no carotid bruit or JVD Back: Symmetric, no curvature, ROM normal, no CVA tenderness Lungs: Clear to auscultation bilaterally, respirations unlabored Heart: Regular rate and rhythm, S1 and S2 normal, no murmur, rub or gallop Abdomen: Soft, non-tender, bowel sounds active all four quadrants, no masses, no organomegaly Extremities: Extremities normal, atraumatic, no cyanosis or edema Pulses: 2+ and symmetric all extremities Skin: Skin color, texture, turgor normal, no rashes or lesions  NEUROLOGIC:   Mental status: alert and oriented, no aphasia, good attention span, Fund of knowledge/ memory ok Motor Exam - grossly normal Sensory Exam - grossly normal Reflexes: Symmetric Coordination - grossly normal Gait - grossly normal Balance - grossly normal Cranial Nerves: I: smell Not tested  II: visual acuity  OS: Normal    OD: Normal   II: visual fields Full to confrontation  II: pupils Equal, round, reactive to light  III,VII: ptosis None  III,IV,VI: extraocular muscles  Full ROM  V: mastication Normal  V: facial light touch sensation  Normal  V,VII: corneal reflex  Present  VII: facial muscle function - upper  Normal  VII: facial muscle function - lower Normal  VIII: hearing Not tested  IX: soft palate elevation  Normal  IX,X: gag reflex Present  XI: trapezius strength  5/5  XI: sternocleidomastoid strength 5/5  XI: neck flexion strength  5/5  XII: tongue strength  Normal    Data Review Lab Results  Component Value Date   WBC 10.6* 05/22/2012   HGB 13.6 05/22/2012   HCT 40.1 05/22/2012   MCV 96.9 05/22/2012   PLT 234 05/22/2012   Lab Results  Component Value Date   NA 135 05/22/2012   K  4.0 05/22/2012   CL 96 05/22/2012   CO2 31 05/22/2012   BUN 22 05/22/2012   CREATININE 0.78 05/22/2012   GLUCOSE 95 05/22/2012   Lab Results  Component Value Date   INR 0.95 03/30/2011    Assessment/Plan: L3-4 and L4-5 spinal stenosis, lumbago, lumbar radiculopathy, neurogenic claudication: I discussed situation with patient. I reviewed her MR scan with her and pointed out the abnormalities. We have discussed the various treatment options including surgery. I described the surgical option of L3 and L4 laminectomy to decompress the L3-4 and L4-5 spinal stenosis. I described the surgery to her. I have shown her surgical models. We have discussed the risks, benefits, alternatives, and likelihood of achieving our goals with surgery. I have answered all the patient's questions. She has decided to proceed with surgery.   Tressie Stalker D 05/27/2012 12:25 PM

## 2012-05-27 NOTE — Anesthesia Postprocedure Evaluation (Signed)
  Anesthesia Post-op Note  Patient: [name]  Procedure(s) Performed: Procedure(s) (LRB) with comments: LUMBAR LAMINECTOMY/DECOMPRESSION MICRODISCECTOMY 1 LEVEL (Bilateral) - Lumbar four laminectomy, with bilateral lumbar three laminotomies.  Patient Location: PACU  Anesthesia Type: General  Level of Consciousness: awake  Airway and Oxygen Therapy: Patient Spontanous Breathing  Post-op Pain: mild  Post-op Assessment: Post-op Vital signs reviewed  Post-op Vital Signs: Reviewed  Complications: No apparent anesthesia complications

## 2012-05-27 NOTE — Anesthesia Preprocedure Evaluation (Addendum)
Anesthesia Evaluation  Patient identified by MRN, date of birth, ID band Patient awake    Reviewed: Allergy & Precautions, H&P , NPO status   History of Anesthesia Complications Negative for: history of anesthetic complications  Airway Mallampati: II TM Distance: >3 FB     Dental  (+) Teeth Intact and Dental Advisory Given   Pulmonary neg pulmonary ROS,    Pulmonary exam normal       Cardiovascular hypertension, Pt. on medications and Pt. on home beta blockers + dysrhythmias Rhythm:Regular Rate:Normal     Neuro/Psych    GI/Hepatic Neg liver ROS, GERD-  Controlled,  Endo/Other  negative endocrine ROSHypothyroidism   Renal/GU negative Renal ROS     Musculoskeletal  (+) Arthritis -, Osteoarthritis,    Abdominal   Peds  Hematology negative hematology ROS (+)   Anesthesia Other Findings   Reproductive/Obstetrics negative OB ROS                         Anesthesia Physical Anesthesia Plan  ASA: III  Anesthesia Plan: General   Post-op Pain Management:    Induction: Intravenous  Airway Management Planned: Oral ETT  Additional Equipment:   Intra-op Plan:   Post-operative Plan: Extubation in OR  Informed Consent: I have reviewed the patients History and Physical, chart, labs and discussed the procedure including the risks, benefits and alternatives for the proposed anesthesia with the patient or authorized representative who has indicated his/her understanding and acceptance.   Dental advisory given  Plan Discussed with: CRNA, Surgeon and Anesthesiologist  Anesthesia Plan Comments:        Anesthesia Quick Evaluation

## 2012-05-27 NOTE — Progress Notes (Signed)
Patient ID: [name], female   DOB: [birth year], 78 y.o.   MRN: [id number] Subjective:  The patient is alert and pleasant. She looks well. She has no complaints.  Objective: Vital signs in last 24 hours: Temp:  [97.1 F (36.2 C)-97.8 F (36.6 C)] 97.1 F (36.2 C) (10/21 1430) Pulse Rate:  [66-103] 103  (10/21 1436) Resp:  [18-27] 27  (10/21 1436) BP: (115-126)/(58-70) 115/58 mmHg (10/21 1435) SpO2:  [97 %-100 %] 100 % (10/21 1436)  Intake/Output from previous day:   Intake/Output this shift: Total I/O In: 1750 [I.V.:1750] Out: 25 [Blood:25]  Physical exam the patient is alert and pleasant. She is moving her lower extremities well.  Lab Results: No results found for this basename: WBC:2,HGB:2,HCT:2,PLT:2 in the last 72 hours BMET No results found for this basename: NA:2,K:2,CL:2,CO2:2,GLUCOSE:2,BUN:2,CREATININE:2,CALCIUM:2 in the last 72 hours  Studies/Results: No results found.  Assessment/Plan: The patient is doing well.  LOS: 0 days     Cheryl Stabenow D 05/27/2012, 2:38 PM

## 2012-05-27 NOTE — Op Note (Signed)
Brief history: The patient is to 78 year old white female who is complaining of back and leg pain consistent with a lumbar radiculopathy/neurogenic claudication. She has failed medical management and was worked up with a lumbar MRI. This demonstrated multilevel degenerative changes was spinal stenosis most severe at L3-4 and L4-5. I discussed the various treatment option with the patient including surgery. The patient has weighed the risks, benefits, and alternatives surgery and decided proceed with an L3-4 and L4-5 laminectomy.  Preoperative diagnosis: L3-4 and L4-5 spinal stenosis with compression of the bilateral L3, L4 and L5 nerve roots., lumbar radiculopathy, neurogenic claudication, lumbago, scoliosis  Postoperative diagnosis: The same  Procedure: L4 laminectomy with bilateral L3 laminotomies to decompress the bilateral L3, L4 and L5 nerve roots using microdissection. Surgeon: Dr. Delma Officer  Asst.: Dr. Barnett Abu  Anesthesia: Gen. endotracheal  Estimated blood loss: 100 cc  Drains: None  Complications: None  Description of procedure: The patient was brought to the operating room by the anesthesia team. General endotracheal anesthesia was induced. The patient was turned to the prone position on the Wilson frame. The patient's lumbosacral region was then prepared with Betadine scrub and Betadine solution. Sterile drapes were applied.  I then injected the area to be incised with Marcaine with epinephrine solution. I then used a scalpel to make a linear midline incision over the L3-4 and L4-5 intervertebral disc space. I then used electrocautery to perform a bilateral  subperiosteal dissection exposing the spinous process and lamina of L3, L4 and L5. We obtained intraoperative radiograph to confirm our location. I then inserted the [facility] retractor for exposure.  We then brought the operative microscope into the field. Under its magnification and illumination we completed the  microdissection. I used a high-speed drill to perform a laminotomy at L3 and L4 bilaterally. I then used a Kerrison punches to complete the laminectomy at L4 and widen the laminotomies at L3. We also used a Kerrison punches to remove the ligamentum flavum at L3-4 and L4-5 as well as a cephalad aspect of the L5 lamina.We then used microdissection to free up the thecal sac and the bilateral L3, L4 and L5 nerve root from the epidural tissue. I then used a Kerrison punch to perform a foraminotomy at about the bilateral L3, L4 and L5 nerve root. We then using the nerve root retractor to gently retract the thecal sac and the nerve roots medially. This exposed the intervertebral disc. We inspected the intervertebral disc at L3-4 and L4-5 bilaterally. There was no significant herniations.  I then palpated along the ventral surface of the thecal sac and along exit route of the bilateral L3, L4 and L5 nerve root and noted that the neural structures were well decompressed. This completed the decompression.  We then obtained hemostasis using bipolar electrocautery. We irrigated the wound out with bacitracin solution. We then removed the retractor. We then reapproximated the patient's thoracolumbar fascia with interrupted #1 Vicryl suture. We then reapproximated the patient's subcutaneous tissue with interrupted 3-0 Vicryl suture. We then reapproximated patient's skin with Steri-Strips and benzoin. The was then coated with bacitracin ointment. The drapes were removed. The patient was subsequently returned to the supine position where they were extubated by the anesthesia team. The patient was then transported to the postanesthesia care unit in stable condition. All sponge instrument and needle counts were reportedly correct at the end of this case.

## 2012-05-28 MED ORDER — DSS 100 MG PO CAPS: 100.0000 mg | ORAL_CAPSULE | Freq: Two times a day (BID) | ORAL | Status: DC

## 2012-05-28 MED ORDER — HYDROCODONE-ACETAMINOPHEN 5-325 MG PO TABS: 1.0000 | ORAL_TABLET | ORAL | Status: DC | PRN

## 2012-05-28 MED ORDER — DIAZEPAM 2 MG PO TABS: 2.0000 mg | ORAL_TABLET | Freq: Four times a day (QID) | ORAL | Status: DC | PRN

## 2012-05-28 NOTE — Progress Notes (Signed)
UR COMPLETED  

## 2012-05-28 NOTE — Discharge Summary (Signed)
Physician Discharge Summary  Patient ID: [name] MRN: [id number] DOB/AGE: [birth year] 78 y.o.  Admit date: 05/27/2012 Discharge date: 05/28/2012  Admission Diagnoses: L3-4 and L4-5 spinal stenosis, lumbar radiculopathy, lumbago, neurogenic claudication.  Discharge Diagnoses: The same Active Problems:  * No active hospital problems. *    Discharged Condition: good  Hospital Course: I admitted the patient to [facility] [place] on 05/27/2012. On that day I performed at L3-4 and L4-5 laminectomy. The surgery went well. The patient's postoperative course was unremarkable. On postop day #1 she requested discharge to home. The patient was given oral and written discharge instructions. All her questions were answered.  Consults: None Significant Diagnostic Studies: None Treatments: L3-4 and L4-5 laminectomy using microdissection Discharge Exam: Blood pressure 120/57, pulse 98, temperature 99.5 F (37.5 C), temperature source Oral, resp. rate 16, SpO2 93.00%. The patient is alert and oriented. Her strength is grossly normal in her lower extremities. Her dressing is clean and dry.  Disposition: Home  Discharge Orders    Future Orders Please Complete By Expires   Diet - low sodium heart healthy      Increase activity slowly      Discharge instructions      Comments:   Call [phone] for a followup appointment.   Remove dressing in 48 hours      Call MD for:  temperature >100.4      Call MD for:  persistant nausea and vomiting      Call MD for:  severe uncontrolled pain      Call MD for:  redness, tenderness, or signs of infection (pain, swelling, redness, odor or green/yellow discharge around incision site)      Call MD for:  difficulty breathing, headache or visual disturbances      Call MD for:  hives      Call MD for:  persistant dizziness or light-headedness      Call MD for:  extreme fatigue          Medication List     As of 05/28/2012  7:38 AM    TAKE these  medications         diazepam 2 MG tablet   Commonly known as: VALIUM   Take 1 tablet (2 mg total) by mouth every 6 (six) hours as needed.      DSS 100 MG Caps   Take 100 mg by mouth 2 (two) times daily.      estradiol 2 MG vaginal ring   Commonly known as: ESTRING   Place 2 mg vaginally every 3 (three) months. follow package directions      Fish Oil 1200 MG Caps   Take 1 capsule by mouth 2 (two) times daily.      hydrochlorothiazide 25 MG tablet   Commonly known as: HYDRODIURIL   Take 25 mg by mouth daily as needed.      HYDROcodone-acetaminophen 5-325 MG per tablet   Commonly known as: NORCO/VICODIN   Take 1-2 tablets by mouth every 4 (four) hours as needed.      levothyroxine 100 MCG tablet   Commonly known as: SYNTHROID, LEVOTHROID   Take 50-100 mcg by mouth See admin instructions. Pt takes 1/2 tab for 50 mcg dose on Tuesday,thursday,saturday,sunday. 100 mcg  On Monday, Wednesday,friday      Magnesium 250 MG Tabs   Take 250 mg by mouth every morning.      metoprolol tartrate 25 MG tablet   Commonly known as: LOPRESSOR   Take  25 mg by mouth every evening.      multivitamin per tablet   Take 1 tablet by mouth daily.      Vitamin D 2000 UNITS Caps   Take 1 capsule by mouth 2 (two) times daily.         SignedCristi Loron 05/28/2012, 7:38 AM

## 2012-05-28 NOTE — Evaluation (Signed)
Physical Therapy Evaluation Patient Details Name: [name] MRN: [id number] DOB: 1928-02-16 Today's Date: 05/28/2012 Time: 4098-1191 PT Time Calculation (min): 18 min  PT Assessment / Plan / Recommendation Clinical Impression  Pt admitted s/p L3-5 laminectomies and currently reports no pain at all and no deficits from baseline. All education completed and pt reports dgtr is going to stay with her for the next couple nights. Pt instructed to use quad cane if going to carry it with her to decrease strain. Pt at baseline with education completed and no further needs at this time. Signing off    PT Assessment  Patient does not need any further PT services    Follow Up Recommendations  No PT follow up    Does the patient have the potential to tolerate intense rehabilitation      Barriers to Discharge        Equipment Recommendations  None recommended by PT    Recommendations for Other Services     Frequency      Precautions / Restrictions Precautions Precautions: Back Precaution Booklet Issued: Yes (comment) Precaution Comments: handout issued and explained with pt able to verbalize instruction   Pertinent Vitals/Pain No pain      Mobility  Bed Mobility Bed Mobility: Rolling Right;Right Sidelying to Sit;Sit to Sidelying Right Rolling Right: 7: Independent Right Sidelying to Sit: 6: Modified independent (Device/Increase time) Sit to Sidelying Right: 6: Modified independent (Device/Increase time) Transfers Transfers: Sit to Stand;Stand to Sit Sit to Stand: 5: Supervision Stand to Sit: 6: Modified independent (Device/Increase time) Details for Transfer Assistance: cueing for upright posture with transfer to stand Ambulation/Gait Ambulation/Gait Assistance: 7: Independent Ambulation Distance (Feet): 400 Feet Assistive device: None Ambulation/Gait Assistance Details: pt carrying quad cane first 200' keeping it off the floor with instruction to actually use it if  carrying it to prevent back and shoulder strain. Pt demonstrated use for grossly 15' then ditched the cane in the hall Gait Pattern: Within Functional Limits Gait velocity: WFL Stairs: No    Shoulder Instructions     Exercises     PT Diagnosis:    PT Problem List:   PT Treatment Interventions:     PT Goals    Visit Information  Last PT Received On: 05/28/12 Assistance Needed: +1    Subjective Data  Subjective: I just carry my cane in the parking lot Patient Stated Goal: get back home and to reading   Prior Functioning  Home Living Lives With: Alone Available Help at Discharge: Friend(s);Available PRN/intermittently Type of Home: House Home Access: Level entry Home Layout: One level Bathroom Shower/Tub: Health visitor: Standard Home Adaptive Equipment: Bedside commode/3-in-1;Walker - rolling;Quad cane;Built-in shower seat Prior Function Level of Independence: Independent Driving: Yes Vocation: Retired Comments: pt states she carries the quad cane in the parking lot at the grocery store but doesn't actually use it Communication Communication: No difficulties Dominant Hand: Right    Cognition  Overall Cognitive Status: Appears within functional limits for tasks assessed/performed Arousal/Alertness: Awake/alert Orientation Level: Appears intact for tasks assessed Behavior During Session: [facility] for tasks performed    Extremity/Trunk Assessment Left Upper Extremity Assessment LUE ROM/Strength/Tone: WFL for tasks assessed Right Lower Extremity Assessment RLE ROM/Strength/Tone: WFL for tasks assessed RLE Sensation: WFL - Light Touch RLE Coordination: WFL - gross/fine motor Left Lower Extremity Assessment LLE ROM/Strength/Tone: WFL for tasks assessed LLE Sensation: WFL - Light Touch LLE Coordination: WFL - gross/fine motor Trunk Assessment Trunk Assessment: Normal   Balance  End of Session PT - End of Session Activity Tolerance: Patient tolerated  treatment well Patient left: in chair;with call bell/phone within reach Nurse Communication: Mobility status  GP     Toney Sang [facility] 05/28/2012, 8:12 AM  [place]aney Me[place], PT [phone]

## 2012-05-28 NOTE — Evaluation (Signed)
Occupational Therapy Evaluation Patient Details Name: [name] MRN: [id number] DOB: 1928/05/16 Today's Date: 05/28/2012 Time: 4098-1191 OT Time Calculation (min): 16 min  OT Assessment / Plan / Recommendation Clinical Impression  Pt. 78 yo female s/p decompression and laminectomy of L3 and L4. Pt. is doing very well and able to recall 3/3 precautions. Educated on AE for LB bathing and dressing with pt showing good demonstration at mod independence level. Will have daughter staying with her to (A) at home. No further acute OT needs at this time. OT to sign off.     OT Assessment  Patient does not need any further OT services    Follow Up Recommendations  No OT follow up    Barriers to Discharge      Equipment Recommendations  None recommended by OT    Recommendations for Other Services    Frequency       Precautions / Restrictions Precautions Precautions: Back Precaution Booklet Issued: Yes (comment) Precaution Comments: able to recall 3/3 precautions Restrictions Weight Bearing Restrictions: No   Pertinent Vitals/Pain No pain     ADL  Grooming: Performed;Wash/dry hands;Modified independent Where Assessed - Grooming: Unsupported standing Lower Body Bathing: Simulated;Modified independent Where Assessed - Lower Body Bathing: Unsupported sit to stand Upper Body Dressing: Performed;Modified independent Where Assessed - Upper Body Dressing: Unsupported standing Lower Body Dressing: Performed;Modified independent Where Assessed - Lower Body Dressing: Unsupported sit to stand Toilet Transfer: Simulated;Modified independent Toilet Transfer Method: Sit to Barista: Raised toilet seat with arms (or 3-in-1 over toilet) Tub/Shower Transfer: Engineer, site Method: Science writer: Walk in shower Equipment Used: Gait belt Transfers/Ambulation Related to ADLs: Pt. is mod independent with transfers and ambulation.  Has a quad cane but does not always use it, will usually carry it with her.   ADL Comments: Pt. able to recall 3/3 back precautions. Educated on use of AE for LB bathing and dressing. Showed good return demonstration for use of reacher to don clothing on LB. Bed mobility pt is mod indpendent and verbalize correct log roll technique while completing action.      OT Diagnosis:    OT Problem List:   OT Treatment Interventions:     OT Goals    Visit Information  Last OT Received On: 05/28/12 Assistance Needed: +1    Subjective Data  Subjective: I am feeling pretty good, ready to get back on my feet  Patient Stated Goal: To get back to doing the things I like   Prior Functioning     Home Living Lives With: Alone Available Help at Discharge: Friend(s);Available PRN/intermittently Type of Home: House Home Access: Level entry Home Layout: One level Bathroom Shower/Tub: Health visitor: Standard Home Adaptive Equipment: Bedside commode/3-in-1;Walker - rolling;Quad cane;Built-in shower seat Prior Function Level of Independence: Independent Driving: Yes Vocation: Retired Comments: pt states she carries the quad cane in the parking lot at the grocery store but doesn't actually use it Communication Communication: No difficulties Dominant Hand: Right         Vision/Perception     Cognition  Overall Cognitive Status: Appears within functional limits for tasks assessed/performed Arousal/Alertness: Awake/alert Orientation Level: Appears intact for tasks assessed Behavior During Session: [facility] for tasks performed    Extremity/Trunk Assessment Right Upper Extremity Assessment RUE ROM/Strength/Tone: [facility] for tasks assessed Left Upper Extremity Assessment LUE ROM/Strength/Tone: [facility] for tasks assessed Right Lower Extremity Assessment RLE ROM/Strength/Tone: [facility] for tasks assessed RLE Sensation: WFL - Light Touch  RLE Coordination: WFL - gross/fine motor Left Lower  Extremity Assessment LLE ROM/Strength/Tone: WFL for tasks assessed LLE Sensation: WFL - Light Touch LLE Coordination: WFL - gross/fine motor Trunk Assessment Trunk Assessment: Normal     Mobility Bed Mobility Bed Mobility: Rolling Right;Right Sidelying to Sit;Sit to Sidelying Right Rolling Right: 6: Modified independent (Device/Increase time) Right Sidelying to Sit: 6: Modified independent (Device/Increase time);HOB flat Sit to Sidelying Right: 6: Modified independent (Device/Increase time);HOB flat Details for Bed Mobility Assistance: Pt. completed log roll technique with no vc's Transfers Transfers: Sit to Stand;Stand to Sit Sit to Stand: 5: Supervision;From bed;With upper extremity assist Stand to Sit: 6: Modified independent (Device/Increase time);To bed;With upper extremity assist Details for Transfer Assistance: pt will attempt to bend forward when transfering sit<>stand. min vc for maintaing precautions.                     End of Session OT - End of Session Equipment Utilized During Treatment: Gait belt Activity Tolerance: Patient tolerated treatment well Patient left: in bed;with call bell/phone within reach Nurse Communication: Mobility status  GO     Cleora Fleet 05/28/2012, 9:20 AM

## 2012-05-28 NOTE — Progress Notes (Signed)
Pt doing well. Pt given D/C instructions with Rx's, verbal response given by Pt. Pt D/C'd home with friend per MD order. Rema Fendt, RN

## 2012-05-28 NOTE — Progress Notes (Signed)
Occupational Therapy Discharge Patient Details Name: [name] MRN: [id number] DOB: 1928-01-29 Today's Date: 05/28/2012 Time: 4098-1191 OT Time Calculation (min): 16 min  Patient discharged from OT services secondary to Pt. is doing very well. Able to recall 3/3 precautions. Educated on use of AE for LB dressing and bathing with pt. showing return demonstration at mod independence level. Daughter will be staying with pt to help (A) with any needs.    Please see latest therapy progress note for current level of functioning and progress toward goals.    Progress and discharge plan discussed with patient and/or caregiver: Patient/Caregiver agrees with plan  GO     Cleora Fleet 05/28/2012, 9:21 AM

## 2012-05-28 NOTE — Progress Notes (Signed)
I agree with the following treatment note after reviewing documentation.   Johnston, Wilfrido Luedke Brynn   OTR/L Pager: [phone] Office: [phone] .   

## 2012-05-28 NOTE — Evaluation (Signed)
I agree with the following treatment note after reviewing documentation.   Johnston, Arael Piccione Brynn   OTR/L Pager: [phone] Office: [phone] .   

## 2012-05-29 ENCOUNTER — Encounter (HOSPITAL_COMMUNITY): Payer: Self-pay | Admitting: Neurosurgery

## 2012-05-31 ENCOUNTER — Encounter (HOSPITAL_COMMUNITY): Payer: Self-pay

## 2012-05-31 MED FILL — Sodium Chloride IV Soln 0.9%: INTRAVENOUS | Qty: 500 | Status: AC

## 2012-05-31 MED FILL — Bacitracin Intramuscular For Soln 50000 Unit: INTRAMUSCULAR | Qty: 1 | Status: AC

## 2012-05-31 MED FILL — Thrombin For Soln Kit 5000 Unit: CUTANEOUS | Qty: 1 | Status: AC

## 2012-10-24 IMAGING — CR DG CHEST 2V
2 series · 2 of 2 positions shown · non-contrast
Comparison: January 06, 2008

CLINICAL DATA: Preoperative respiratory exam; right total knee
arthroplasty

CHEST - 2 VIEW

[view not recorded (1 of 2)]
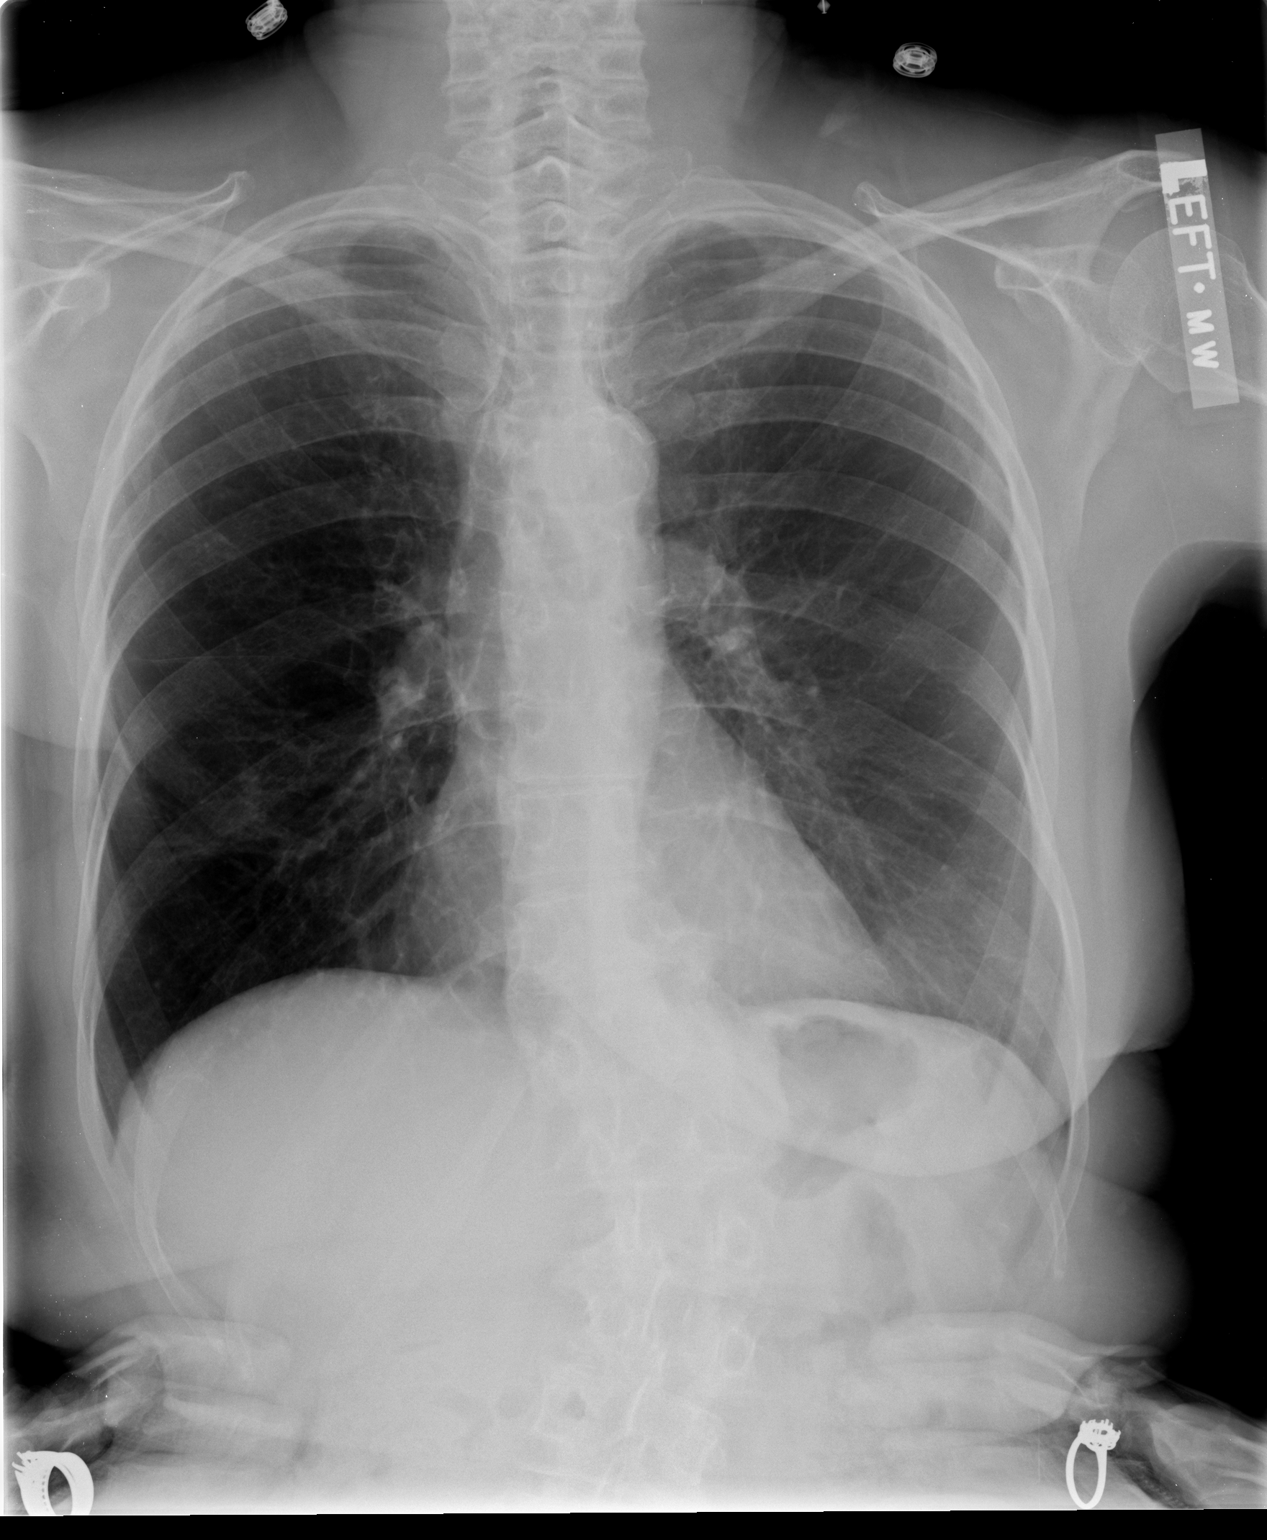

[view not recorded (2 of 2)]
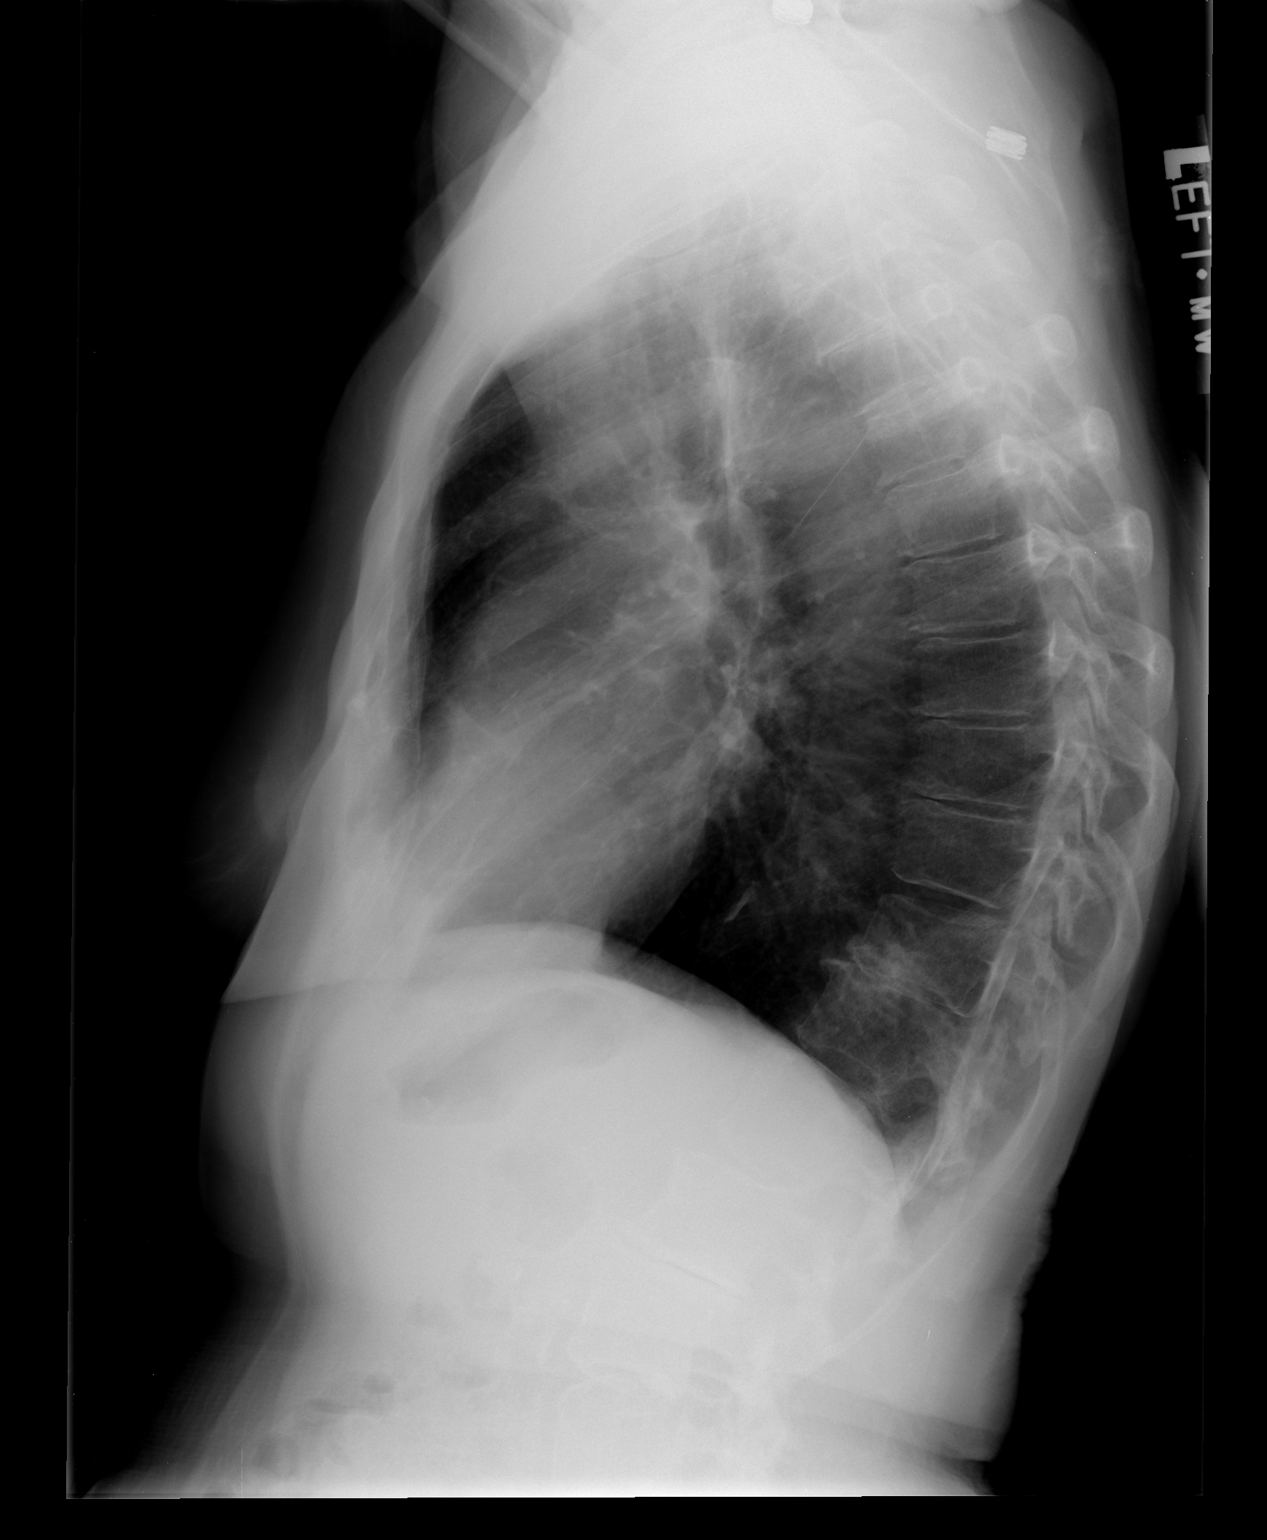

[2 of 2 positions shown; findings below may reference images not displayed]

FINDINGS: The cardiac silhouette, mediastinum, pulmonary
vasculature are within normal limits.  Both lungs are clear.
There is no acute bony abnormality.   Thoracolumbar scoliosis is
again noted.
IMPRESSION: There is no evidence of acute cardiac or pulmonary process.

## 2013-04-09 ENCOUNTER — Other Ambulatory Visit (HOSPITAL_COMMUNITY): Payer: Self-pay | Admitting: Internal Medicine

## 2013-04-09 DIAGNOSIS — Z1231 Encounter for screening mammogram for malignant neoplasm of breast: Secondary | ICD-10-CM

## 2013-04-22 ENCOUNTER — Ambulatory Visit (HOSPITAL_COMMUNITY)
Admission: RE | Admit: 2013-04-22 | Discharge: 2013-04-22 | Disposition: A | Discharge status: Home / Self Care | Payer: Medicare Other | Source: Ambulatory Visit | Attending: Internal Medicine | Admitting: Internal Medicine

## 2013-04-22 DIAGNOSIS — Z1231 Encounter for screening mammogram for malignant neoplasm of breast: Secondary | ICD-10-CM

## 2013-05-21 ENCOUNTER — Other Ambulatory Visit: Payer: Self-pay | Admitting: Internal Medicine

## 2013-05-21 DIAGNOSIS — R109 Unspecified abdominal pain: Secondary | ICD-10-CM

## 2013-05-22 ENCOUNTER — Ambulatory Visit
Admission: RE | Admit: 2013-05-22 | Discharge: 2013-05-22 | Disposition: A | Discharge status: Home / Self Care | Payer: Medicare Other | Source: Ambulatory Visit | Attending: Internal Medicine | Admitting: Internal Medicine

## 2013-05-22 DIAGNOSIS — R109 Unspecified abdominal pain: Secondary | ICD-10-CM

## 2013-06-23 ENCOUNTER — Encounter: Payer: Self-pay | Admitting: Internal Medicine

## 2013-06-23 DIAGNOSIS — E785 Hyperlipidemia, unspecified: Secondary | ICD-10-CM | POA: Insufficient documentation

## 2013-06-23 DIAGNOSIS — C50919 Malignant neoplasm of unspecified site of unspecified female breast: Secondary | ICD-10-CM | POA: Insufficient documentation

## 2013-06-24 ENCOUNTER — Encounter: Payer: Self-pay | Admitting: Internal Medicine

## 2013-06-24 ENCOUNTER — Ambulatory Visit: Payer: Medicare Other | Admitting: Internal Medicine

## 2013-06-24 VITALS — BP 126/76 | HR 80 | Temp 98.2°F | Resp 16 | Ht 61.0 in | Wt 123.0 lb

## 2013-06-24 DIAGNOSIS — Z1212 Encounter for screening for malignant neoplasm of rectum: Secondary | ICD-10-CM

## 2013-06-24 DIAGNOSIS — E559 Vitamin D deficiency, unspecified: Secondary | ICD-10-CM

## 2013-06-24 DIAGNOSIS — R7309 Other abnormal glucose: Secondary | ICD-10-CM

## 2013-06-24 DIAGNOSIS — E782 Mixed hyperlipidemia: Secondary | ICD-10-CM

## 2013-06-24 DIAGNOSIS — Z79899 Other long term (current) drug therapy: Secondary | ICD-10-CM | POA: Insufficient documentation

## 2013-06-24 DIAGNOSIS — I10 Essential (primary) hypertension: Secondary | ICD-10-CM | POA: Insufficient documentation

## 2013-06-24 LAB — CBC WITH DIFFERENTIAL/PLATELET
Basophils Absolute: 0 10*3/uL (ref 0.0–0.1)
Basophils Relative: 1 % (ref 0–1)
HCT: 41.2 % (ref 36.0–46.0)
Lymphocytes Relative: 24 % (ref 12–46)
MCHC: 33.7 g/dL (ref 30.0–36.0)
Monocytes Absolute: 0.8 10*3/uL (ref 0.1–1.0)
Neutro Abs: 5.7 10*3/uL (ref 1.7–7.7)
Neutrophils Relative %: 64 % (ref 43–77)
Platelets: 266 10*3/uL (ref 150–400)
RDW: 13.8 % (ref 11.5–15.5)
WBC: 8.8 10*3/uL (ref 4.0–10.5)

## 2013-06-24 LAB — BASIC METABOLIC PANEL WITH GFR
BUN: 20 mg/dL (ref 6–23)
Calcium: 9.6 mg/dL (ref 8.4–10.5)
Glucose, Bld: 103 mg/dL — ABNORMAL HIGH (ref 70–99)
Potassium: 4.2 mEq/L (ref 3.5–5.3)
Sodium: 136 mEq/L (ref 135–145)

## 2013-06-24 LAB — HEMOGLOBIN A1C
Hgb A1c MFr Bld: 6.4 % — ABNORMAL HIGH (ref <5.7)
Mean Plasma Glucose: 137 mg/dL — ABNORMAL HIGH (ref <117)

## 2013-06-24 LAB — HEPATIC FUNCTION PANEL
AST: 19 U/L (ref 0–37)
Albumin: 4.1 g/dL (ref 3.5–5.2)
Alkaline Phosphatase: 80 U/L (ref 39–117)
Indirect Bilirubin: 0.3 mg/dL (ref 0.0–0.9)
Total Bilirubin: 0.4 mg/dL (ref 0.3–1.2)

## 2013-06-24 LAB — LIPID PANEL
LDL Cholesterol: 95 mg/dL (ref 0–99)
Total CHOL/HDL Ratio: 3.5 Ratio
VLDL: 35 mg/dL (ref 0–40)

## 2013-06-24 MED ORDER — CIMETIDINE 200 MG PO TABS: ORAL_TABLET | ORAL | Status: DC

## 2013-06-24 NOTE — Progress Notes (Signed)
Patient ID: [name], female   DOB: [birth year], 79 y.o.   MRN: [id number]   This very nice 79 yo WWFpresents for 3 month follow up with hypertension, hyperlipidemia, pre-diabetes and vitamin D deficiency.    BP has been controlled at home. Today's BP is 126/76. Patient denies any cardiac type chest pain, palpitations, dyspnea, dizziness, claudication, or dependent edema.   Hyperlipidemia is controlled with diet. Last cholesterol was 183, Triglycerides were  199, HDL 57, and LDL 86 at goal in August.    Also, the patient has history of prediabetes with last A1c of 6.6% in August for which she is attempting stricter diet. Patient denies any symptoms of reactive hypoglycemia, diabetic polys, paresthesias or visual blurring.   She also reports occasional heart burn and reflux which has been controlled with diet and occasional OTC cimetadine.   Further, Patient has history of vitamin D deficiency with last vitamin D of  93 in August. Patient supplements vitamin without any suspected side-effects.  Current Outpatient Prescriptions on File Prior to Visit  Medication Sig Dispense Refill  . Cholecalciferol (VITAMIN D) 2000 UNITS CAPS Take 1 capsule by mouth 2 (two) times daily.      . diazepam (VALIUM) 2 MG tablet Take 1 tablet (2 mg total) by mouth every 6 (six) hours as needed.  50 tablet  1  . docusate sodium 100 MG CAPS Take 100 mg by mouth 2 (two) times daily.  60 capsule  1  . estradiol (ESTRING) 2 MG vaginal ring Place 2 mg vaginally every 3 (three) months. follow package directions      . hydrochlorothiazide (HYDRODIURIL) 25 MG tablet Take 25 mg by mouth daily as needed.      Marland Kitchen HYDROcodone-acetaminophen (NORCO/VICODIN) 5-325 MG per tablet Take 1-2 tablets by mouth every 4 (four) hours as needed.  100 tablet  1  . levothyroxine (SYNTHROID, LEVOTHROID) 100 MCG tablet Take 50-100 mcg by mouth See admin instructions. Pt takes 1/2 tab for 50 mcg dose on Tuesday,thursday,saturday,sunday. 100 mcg   On Monday, Wednesday,friday      . Magnesium 250 MG TABS Take 250 mg by mouth every morning.      . metoprolol tartrate (LOPRESSOR) 25 MG tablet Take 25 mg by mouth every evening.       . multivitamin (THERAGRAN) per tablet Take 1 tablet by mouth daily.      . Omega-3 Fatty Acids (FISH OIL) 1200 MG CAPS Take 1 capsule by mouth 2 (two) times daily.       No current facility-administered medications on file prior to visit.     Allergies  Allergen Reactions  . Gemfibrozil   . Nsaids Other (See Comments)    "irritates my stomach"  . Pravastatin     Muscle aches  . Pse Hcl-Dm Hbr-Guaifenesin Tan     Palpitations    PMHx:   Past Medical History  Diagnosis Date  . Arthritis   . GERD (gastroesophageal reflux disease)   . Breast cancer   . Complication of anesthesia     Very hard to wake up after anesthesia  . Dizziness   . Seasonal allergies   . Frequency of urination   . Dysrhythmia     Sinus tachycardia can get up to 108. Started after chemo   . Hypertension   . Hyperlipidemia   . Thyroid disease   . Hypothyroidism     FHx:    Reviewed / unchanged  SHx:    Reviewed / unchanged  Systems Review: Constitutional: Denies fever, chills, wt changes, headaches, insomnia, fatigue, night sweats, change in appetite. Eyes: Denies redness, blurred vision, diplopia, discharge, itchy, watery eyes.  ENT: Denies discharge, congestion, post nasal drip, epistaxis, sore throat, earache, hearing loss, dental pain, tinnitus, vertigo, sinus pain, snoring.  CV: Denies chest pain, palpitations, irregular heartbeat, syncope, dyspnea, diaphoresis, orthopnea, PND, claudication, edema. Respiratory: denies cough, dyspnea, DOE, pleurisy, hoarseness, laryngitis, wheezing.  Gastrointestinal: has occasional heartburn and reflux as above, but denies dysphagia, odynophagia,  water brash, abdominal pain or cramps, nausea, vomiting, bloating, diarrhea, constipation, hematemesis, melena, hematochezia,   Hemorrhoids. Genitourinary: Denies dysuria, frequency, urgency, nocturia, hesitancy, discharge, hematuria, flank pain. Musculoskeletal: Denies arthralgias, myalgias, stiffness, jt. swelling, pain, limp, strain/sprain.  Skin: Denies pruritus, rash, hives, warts, acne, eczema, change in skin lesion(s). Neuro: No weakness, tremor, incoordination, spasms, paresthesia, or pain. Psychiatric: Denies confusion, memory loss, or sensory loss. Endo: Denies change in weight, skin, hair change.  Heme/Lymph: No excessive bleeding, bruising, or enlarged lymph nodes.  Filed Vitals:   06/24/13 1212  BP: 126/76  Pulse: 80  Temp: 98.2 F (36.8 C)  Resp: 16    Estimated body mass index is 23.25 kg/(m^2) as calculated from the following:   Height as of this encounter: 5\' 1"  (1.549 m).   Weight as of this encounter: 123 lb (55.792 kg).  On Exam: Appears well nourished - in no distress. Eyes: PERRLA, EOMs, conjunctiva no swelling or erythema. Sinuses: No frontal/maxillary tenderness ENT/Mouth: EAC's clear, TM's nl w/o erythema, bulging. Nares clear w/o erythema, swelling, exudates. Oropharynx clear without erythema or exudates. Oral hygiene is good. Tongue normal, non obstructing. Hearing intact.  Neck: Supple. Thyroid nl. Car 2+/2+ without bruits, nodes or JVD. Chest: Respirations nl with BS clear & equal w/o rales, rhonchi, wheezing or stridor.  Cor: Heart sounds normal w/ regular rate and rhythm without sig. murmurs, gallops, clicks, or rubs. Peripheral pulses normal and equal  without edema.  Abdomen: Soft & bowel sounds normal. Non-tender w/o guarding, rebound, hernias, masses, or organomegaly.  Lymphatics: Unremarkable.  Musculoskeletal: Full ROM all peripheral extremities, joint stability, 5/5 strength, and normal gait.  Skin: Warm, dry without exposed rashes, lesions, ecchymosis apparent. +discolored thickened Rt 1st toenail Neuro: Cranial nerves intact, reflexes equal bilaterally. Sensory-motor  testing grossly intact. Tendon reflexes grossly intact.  Pysch: Alert & oriented x 3. Insight and judgement nl & appropriate. No ideations.  Assessment and Plan:  1. Hypertension - Continue monitor blood pressure at home. Continue diet/meds same.  2. Hyperlipidemia - Continue diet/meds, exercise,& lifestyle modifications. Continue monitor periodic cholesterol/liver & renal functions   3. Pre-diabetes/Insulin Resistance - Continue diet, exercise, lifestyle modifications. Monitor appropriate labs.  4. Vitamin D Deficiency - Continue supplementation.  5.GERD - continue diet and prn cimetadine as above.  6. Onychomycosis of toenail - advised podiatrist consult.  Further disposition pending results of labs.

## 2013-06-24 NOTE — Patient Instructions (Addendum)
Continue diet & medications same as discussed.   Further disposition pending lab results.   Diet for Gastroesophageal Reflux Disease, Adult Reflux (acid reflux) is when acid from your stomach flows up into the esophagus. When acid comes in contact with the esophagus, the acid causes irritation and soreness (inflammation) in the esophagus. When reflux happens often or so severely that it causes damage to the esophagus, it is called gastroesophageal reflux disease (GERD). Nutrition therapy can help ease the discomfort of GERD. FOODS OR DRINKS TO AVOID OR LIMIT  Smoking or chewing tobacco. Nicotine is one of the most potent stimulants to acid production in the gastrointestinal tract.  Caffeinated and decaffeinated coffee and black tea.  Regular or low-calorie carbonated beverages or energy drinks (caffeine-free carbonated beverages are allowed).   Strong spices, such as black pepper, white pepper, red pepper, cayenne, curry powder, and chili powder.  Peppermint or spearmint.  Chocolate.  High-fat foods, including meats and fried foods. Extra added fats including oils, butter, salad dressings, and nuts. Limit these to less than 8 tsp per day.  Fruits and vegetables if they are not tolerated, such as citrus fruits or tomatoes.  Alcohol.  Any food that seems to aggravate your condition. If you have questions regarding your diet, call your caregiver or a registered dietitian. OTHER THINGS THAT MAY HELP GERD INCLUDE:   Eating your meals slowly, in a relaxed setting.  Eating 5 to 6 small meals per day instead of 3 large meals.  Eliminating food for a period of time if it causes distress.  Not lying down until 3 hours after eating a meal.  Keeping the head of your bed raised 6 to 9 inches (15 to 23 cm) by using a foam wedge or blocks under the legs of the bed. Lying flat may make symptoms worse.  Being physically active. Weight loss may be helpful in reducing reflux in overweight  or obese adults.  Wear loose fitting clothing EXAMPLE MEAL PLAN This meal plan is approximately 2,000 calories based on https://www.[facility].org/ meal planning guidelines. Breakfast   cup cooked oatmeal.  1 cup strawberries.  1 cup low-fat milk.  1 oz almonds. Snack  1 cup cucumber slices.  6 oz yogurt (made from low-fat or fat-free milk). Lunch  2 slice whole-wheat bread.  2 oz sliced [place].  2 tsp mayonnaise.  1 cup blueberries.  1 cup snap peas. Snack  6 whole-wheat crackers.  1 oz string cheese. Dinner   cup brown rice.  1 cup mixed veggies.  1 tsp olive oil.  3 oz grilled fish. Document Released: 07/24/2005 Document Revised: 10/16/2011 Document Reviewed: 06/09/2011 [facility] Patient Information 2014 [place], [place].  Gastroesophageal Reflux Disease, Adult Gastroesophageal reflux disease (GERD) happens when acid from your stomach flows up into the esophagus. When acid comes in contact with the esophagus, the acid causes soreness (inflammation) in the esophagus. Over time, GERD may create small holes (ulcers) in the lining of the esophagus. CAUSES   Increased body weight. This puts pressure on the stomach, making acid rise from the stomach into the esophagus.  Smoking. This increases acid production in the stomach.  Drinking alcohol. This causes decreased pressure in the lower esophageal sphincter (valve or ring of muscle between the esophagus and stomach), allowing acid from the stomach into the esophagus.  Late evening meals and a full stomach. This increases pressure and acid production in the stomach.  A malformed lower esophageal sphincter. Sometimes, no cause is found. SYMPTOMS   Burning  pain in the lower part of the mid-chest behind the breastbone and in the mid-stomach area. This may occur twice a week or more often.  Trouble swallowing.  Sore throat.  Dry cough.  Asthma-like symptoms including chest tightness, shortness of breath, or  wheezing. DIAGNOSIS  Your caregiver may be able to diagnose GERD based on your symptoms. In some cases, X-rays and other tests may be done to check for complications or to check the condition of your stomach and esophagus. TREATMENT  Your caregiver may recommend over-the-counter or prescription medicines to help decrease acid production. Ask your caregiver before starting or adding any new medicines.  HOME CARE INSTRUCTIONS   Change the factors that you can control. Ask your caregiver for guidance concerning weight loss, quitting smoking, and alcohol consumption.  Avoid foods and drinks that make your symptoms worse, such as:  Caffeine or alcoholic drinks.  Chocolate.  Peppermint or mint flavorings.  Garlic and onions.  Spicy foods.  Citrus fruits, such as oranges, lemons, or limes.  Tomato-based foods such as sauce, chili, salsa, and pizza.  Fried and fatty foods.  Avoid lying down for the 3 hours prior to your bedtime or prior to taking a nap.  Eat small, frequent meals instead of large meals.  Wear loose-fitting clothing. Do not wear anything tight around your waist that causes pressure on your stomach.  Raise the head of your bed 6 to 8 inches with wood blocks to help you sleep. Extra pillows will not help.  Only take over-the-counter or prescription medicines for pain, discomfort, or fever as directed by your caregiver.  Do not take aspirin, ibuprofen, or other nonsteroidal anti-inflammatory drugs (NSAIDs). SEEK IMMEDIATE MEDICAL CARE IF:   You have pain in your arms, neck, jaw, teeth, or back.  Your pain increases or changes in intensity or duration.  You develop nausea, vomiting, or sweating (diaphoresis).  You develop shortness of breath, or you faint.  Your vomit is green, yellow, black, or looks like coffee grounds or blood.  Your stool is red, bloody, or black. These symptoms could be signs of other problems, such as heart disease, gastric bleeding, or  esophageal bleeding. MAKE SURE YOU:   Understand these instructions.  Will watch your condition.  Will get help right away if you are not doing well or get worse. Document Released: 05/03/2005 Document Revised: 10/16/2011 Document Reviewed: 02/10/2011 [facility] Patient Information 2014 [place], [place].   Hypertension As your heart beats, it forces blood through your arteries. This force is your blood pressure. If the pressure is too high, it is called hypertension (HTN) or high blood pressure. HTN is dangerous because you may have it and not know it. High blood pressure may mean that your heart has to work harder to pump blood. Your arteries may be narrow or stiff. The extra work puts you at risk for heart disease, stroke, and other problems.  Blood pressure consists of two numbers, a higher number over a lower, 110/72, for example. It is stated as "110 over 72." The ideal is below 120 for the top number (systolic) and under 80 for the bottom (diastolic). Write down your blood pressure today. You should pay close attention to your blood pressure if you have certain conditions such as:  Heart failure.  Prior heart attack.  Diabetes  Chronic kidney disease.  Prior stroke.  Multiple risk factors for heart disease. To see if you have HTN, your blood pressure should be measured while you are seated with your  arm held at the level of the heart. It should be measured at least twice. A one-time elevated blood pressure reading (especially in the Emergency Department) does not mean that you need treatment. There may be conditions in which the blood pressure is different between your right and left arms. It is important to see your caregiver soon for a recheck. Most people have essential hypertension which means that there is not a specific cause. This type of high blood pressure may be lowered by changing lifestyle factors such as:  Stress.  Smoking.  Lack of exercise.  Excessive  weight.  Drug/tobacco/alcohol use.  Eating less salt. Most people do not have symptoms from high blood pressure until it has caused damage to the body. Effective treatment can often prevent, delay or reduce that damage. TREATMENT  When a cause has been identified, treatment for high blood pressure is directed at the cause. There are a large number of medications to treat HTN. These fall into several categories, and your caregiver will help you select the medicines that are best for you. Medications may have side effects. You should review side effects with your caregiver. If your blood pressure stays high after you have made lifestyle changes or started on medicines,   Your medication(s) may need to be changed.  Other problems may need to be addressed.  Be certain you understand your prescriptions, and know how and when to take your medicine.  Be sure to follow up with your caregiver within the time frame advised (usually within two weeks) to have your blood pressure rechecked and to review your medications.  If you are taking more than one medicine to lower your blood pressure, make sure you know how and at what times they should be taken. Taking two medicines at the same time can result in blood pressure that is too low. SEEK IMMEDIATE MEDICAL CARE IF:  You develop a severe headache, blurred or changing vision, or confusion.  You have unusual weakness or numbness, or a faint feeling.  You have severe chest or abdominal pain, vomiting, or breathing problems. MAKE SURE YOU:   Understand these instructions.  Will watch your condition.  Will get help right away if you are not doing well or get worse. Document Released: 07/24/2005 Document Revised: 10/16/2011 Document Reviewed: 03/13/2008 [facility] Patient Information 2014 [place], [place].   Diabetes and Exercise Exercising regularly is important. It is not just about losing weight. It has many health benefits, such as:  Improving  your overall fitness, flexibility, and endurance.  Increasing your bone density.  Helping with weight control.  Decreasing your body fat.  Increasing your muscle strength.  Reducing stress and tension.  Improving your overall health. People with diabetes who exercise gain additional benefits because exercise:  Reduces appetite.  Improves the body's use of blood sugar (glucose).  Helps lower or control blood glucose.  Decreases blood pressure.  Helps control blood lipids (such as cholesterol and triglycerides).  Improves the body's use of the hormone insulin by:  Increasing the body's insulin sensitivity.  Reducing the body's insulin needs.  Decreases the risk for heart disease because exercising:  Lowers cholesterol and triglycerides levels.  Increases the levels of good cholesterol (such as high-density lipoproteins [HDL]) in the body.  Lowers blood glucose levels. YOUR ACTIVITY PLAN  Choose an activity that you enjoy and set realistic goals. Your health care provider or diabetes educator can help you make an activity plan that works for you. You can break activities into  2 or 3 sessions throughout the day. Doing so is as good as one long session. Exercise ideas include:  Taking the dog for a walk.  Taking the stairs instead of the elevator.  Dancing to your favorite song.  Doing your favorite exercise with a friend. RECOMMENDATIONS FOR EXERCISING WITH TYPE 1 OR TYPE 2 DIABETES   Check your blood glucose before exercising. If blood glucose levels are greater than 240 mg/dL, check for urine ketones. Do not exercise if ketones are present.  Avoid injecting insulin into areas of the body that are going to be exercised. For example, avoid injecting insulin into:  The arms when playing tennis.  The legs when jogging.  Keep a record of:  Food intake before and after you exercise.  Expected peak times of insulin action.  Blood glucose levels before and after  you exercise.  The type and amount of exercise you have done.  Review your records with your health care provider. Your health care provider will help you to develop guidelines for adjusting food intake and insulin amounts before and after exercising.  If you take insulin or oral hypoglycemic agents, watch for signs and symptoms of hypoglycemia. They include:  Dizziness.  Shaking.  Sweating.  Chills.  Confusion.  Drink plenty of water while you exercise to prevent dehydration or heat stroke. Body water is lost during exercise and must be replaced.  Talk to your health care provider before starting an exercise program to make sure it is safe for you. Remember, almost any type of activity is better than none. Document Released: 10/14/2003 Document Revised: 03/26/2013 Document Reviewed: 12/31/2012 [facility] Patient Information 2014 [place], [place].    Cholesterol Cholesterol is a white, waxy, fat-like protein needed by your body in small amounts. The liver makes all the cholesterol you need. It is carried from the liver by the blood through the blood vessels. Deposits (plaque) may build up on blood vessel walls. This makes the arteries narrower and stiffer. Plaque increases the risk for heart attack and stroke. You cannot feel your cholesterol level even if it is very high. The only way to know is by a blood test to check your lipid (fats) levels. Once you know your cholesterol levels, you should keep a record of the test results. Work with your caregiver to to keep your levels in the desired range. WHAT THE RESULTS MEAN:  Total cholesterol is a rough measure of all the cholesterol in your blood.  LDL is the so-called bad cholesterol. This is the type that deposits cholesterol in the walls of the arteries. You want this level to be low.  HDL is the good cholesterol because it cleans the arteries and carries the LDL away. You want this level to be high.  Triglycerides are fat that the  body can either burn for energy or store. High levels are closely linked to heart disease. DESIRED LEVELS:  Total cholesterol below 200.  LDL below 100 for people at risk, below 70 for very high risk.  HDL above 50 is good, above 60 is best.  Triglycerides below 150. HOW TO LOWER YOUR CHOLESTEROL:  Diet.  Choose fish or white meat chicken and [place], roasted or baked. Limit fatty cuts of red meat, fried foods, and processed meats, such as sausage and lunch meat.  Eat lots of fresh fruits and vegetables. Choose whole grains, beans, pasta, potatoes and cereals.  Use only small amounts of olive, corn or canola oils. Avoid butter, mayonnaise, shortening or palm  kernel oils. Avoid foods with trans-fats.  Use skim/nonfat milk and low-fat/nonfat yogurt and cheeses. Avoid whole milk, cream, ice cream, egg yolks and cheeses. Healthy desserts include angel food cake, ginger snaps, animal crackers, hard candy, popsicles, and low-fat/nonfat frozen yogurt. Avoid pastries, cakes, pies and cookies.  Exercise.  A regular program helps decrease LDL and raises HDL.  Helps with weight control.  Do things that increase your activity level like gardening, walking, or taking the stairs.  Medication.  May be prescribed by your caregiver to help lowering cholesterol and the risk for heart disease.  You may need medicine even if your levels are normal if you have several risk factors. HOME CARE INSTRUCTIONS   Follow your diet and exercise programs as suggested by your caregiver.  Take medications as directed.  Have blood work done when your caregiver feels it is necessary. MAKE SURE YOU:   Understand these instructions.  Will watch your condition.  Will get help right away if you are not doing well or get worse. Document Released: 04/18/2001 Document Revised: 10/16/2011 Document Reviewed: 10/09/2007 [facility] Patient Information 2014 [place], [place].   Vitamin D Deficiency Vitamin D is an  important vitamin that your body needs. Having too little of it in your body is called a deficiency. A very bad deficiency can make your bones soft and can cause a condition called rickets.  Vitamin D is important to your body for different reasons, such as:   It helps your body absorb 2 minerals called calcium and phosphorus.  It helps make your bones healthy.  It may prevent some diseases, such as diabetes and multiple sclerosis.  It helps your muscles and heart. You can get vitamin D in several ways. It is a natural part of some foods. The vitamin is also added to some dairy products and cereals. Some people take vitamin D supplements. Also, your body makes vitamin D when you are in the sun. It changes the sun's rays into a form of the vitamin that your body can use. CAUSES   Not eating enough foods that contain vitamin D.  Not getting enough sunlight.  Having certain digestive system diseases that make it hard to absorb vitamin D. These diseases include Crohn's disease, chronic pancreatitis, and cystic fibrosis.  Having a surgery in which part of the stomach or small intestine is removed.  Being obese. Fat cells pull vitamin D out of your blood. That means that obese people may not have enough vitamin D left in their blood and in other body tissues.  Having chronic kidney or liver disease. RISK FACTORS Risk factors are things that make you more likely to develop a vitamin D deficiency. They include:  Being older.  Not being able to get outside very much.  Living in a nursing home.  Having had broken bones.  Having weak or thin bones (osteoporosis).  Having a disease or condition that changes how your body absorbs vitamin D.  Having dark skin.  Some medicines such as seizure medicines or steroids.  Being overweight or obese. SYMPTOMS Mild cases of vitamin D deficiency may not have any symptoms. If you have a very bad case, symptoms may include:  Bone pain.  Muscle  pain.  Falling often.  Broken bones caused by a minor injury, due to osteoporosis. DIAGNOSIS A blood test is the best way to tell if you have a vitamin D deficiency. TREATMENT Vitamin D deficiency can be treated in different ways. Treatment for vitamin D deficiency depends  on what is causing it. Options include:  Taking vitamin D supplements.  Taking a calcium supplement. Your caregiver will suggest what dose is best for you. HOME CARE INSTRUCTIONS  Take any supplements that your caregiver prescribes. Follow the directions carefully. Take only the suggested amount.  Have your blood tested 2 months after you start taking supplements.  Eat foods that contain vitamin D. Healthy choices include:  Fortified dairy products, cereals, or juices. Fortified means vitamin D has been added to the food. Check the label on the package to be sure.  Fatty fish like salmon or trout.  Eggs.  Oysters.  Do not use a tanning bed.  Keep your weight at a healthy level. Lose weight if you need to.  Keep all follow-up appointments. Your caregiver will need to perform blood tests to make sure your vitamin D deficiency is going away. SEEK MEDICAL CARE IF:  You have any questions about your treatment.  You continue to have symptoms of vitamin D deficiency.  You have nausea or vomiting.  You are constipated.  You feel confused.  You have severe abdominal or back pain. MAKE SURE YOU:  Understand these instructions.  Will watch your condition.  Will get help right away if you are not doing well or get worse. Document Released: 10/16/2011 Document Revised: 11/18/2012 Document Reviewed: 10/16/2011 [facility] Patient Information 2014 [place], [place].

## 2013-07-02 ENCOUNTER — Encounter: Payer: Self-pay | Admitting: Podiatry

## 2013-07-02 ENCOUNTER — Ambulatory Visit (INDEPENDENT_AMBULATORY_CARE_PROVIDER_SITE_OTHER): Payer: Medicare Other | Admitting: Podiatry

## 2013-07-02 VITALS — BP 132/72 | HR 83 | Resp 16 | Ht 62.0 in | Wt 123.0 lb

## 2013-07-02 DIAGNOSIS — B351 Tinea unguium: Secondary | ICD-10-CM

## 2013-07-02 DIAGNOSIS — M79609 Pain in unspecified limb: Secondary | ICD-10-CM

## 2013-07-02 NOTE — Progress Notes (Signed)
   Subjective:    Patient ID: [name], female    DOB: 17-May-1928, 79 y.o.   MRN: [id number]  "I have an ingrown toenail and fungus on my right big toe."  HPI Comments: N  Ingrown, fungus L  Fungal Nail Hallux Rt D  3 yrs. O  Suddenly, dropped something on it  C  Gradually gotten worse A  None  T  Fungus medicine 3 rounds prescribed by Dr. Oneta Rack      Review of Systems  Constitutional: Positive for appetite change.  HENT: Positive for hearing loss and sinus pressure.   Musculoskeletal: Positive for arthralgias, back pain, gait problem and myalgias.       Joint pain  Neurological: Positive for dizziness.  All other systems reviewed and are negative.       Objective:   Physical Exam Subjective: Orientated x3 white female  Vascular: The DP and PT pulses are two over four bilaterally  Neurological: Sensation intact bilaterally  Dermatological: Hypertrophic right hallux nail that is partially detached from the nailbed. The nail is texture and color changes.  Musculoskeletal: Bilateral HAV deformities noted.       Assessment & Plan:  Assessment: Symptomatic onychomycosis right hallux  Plan: Right hallux nail was debrided back without any bleeding. Reappoint at patient's request.

## 2013-08-14 ENCOUNTER — Ambulatory Visit (INDEPENDENT_AMBULATORY_CARE_PROVIDER_SITE_OTHER): Payer: 59 | Admitting: Physician Assistant

## 2013-08-14 ENCOUNTER — Encounter: Payer: Self-pay | Admitting: Physician Assistant

## 2013-08-14 VITALS — BP 122/72 | HR 76 | Temp 98.5°F | Resp 16 | Ht 61.0 in | Wt 120.0 lb

## 2013-08-14 DIAGNOSIS — G8929 Other chronic pain: Secondary | ICD-10-CM

## 2013-08-14 DIAGNOSIS — R1013 Epigastric pain: Secondary | ICD-10-CM

## 2013-08-14 DIAGNOSIS — R197 Diarrhea, unspecified: Secondary | ICD-10-CM

## 2013-08-14 LAB — CBC WITH DIFFERENTIAL/PLATELET
Basophils Absolute: 0 10*3/uL (ref 0.0–0.1)
Basophils Relative: 0 % (ref 0–1)
Eosinophils Absolute: 0.1 10*3/uL (ref 0.0–0.7)
Eosinophils Relative: 1 % (ref 0–5)
HEMATOCRIT: 38.3 % (ref 36.0–46.0)
HEMOGLOBIN: 12.8 g/dL (ref 12.0–15.0)
LYMPHS ABS: 1.9 10*3/uL (ref 0.7–4.0)
LYMPHS PCT: 25 % (ref 12–46)
MCH: 31 pg (ref 26.0–34.0)
MCHC: 33.4 g/dL (ref 30.0–36.0)
MCV: 92.7 fL (ref 78.0–100.0)
MONOS PCT: 10 % (ref 3–12)
Monocytes Absolute: 0.8 10*3/uL (ref 0.1–1.0)
NEUTROS ABS: 5 10*3/uL (ref 1.7–7.7)
NEUTROS PCT: 64 % (ref 43–77)
Platelets: 255 10*3/uL (ref 150–400)
RBC: 4.13 MIL/uL (ref 3.87–5.11)
RDW: 13.8 % (ref 11.5–15.5)
WBC: 7.8 10*3/uL (ref 4.0–10.5)

## 2013-08-14 LAB — AMYLASE: AMYLASE: 31 U/L (ref 0–105)

## 2013-08-14 LAB — HEPATIC FUNCTION PANEL
ALK PHOS: 100 U/L (ref 39–117)
ALT: 20 U/L (ref 0–35)
AST: 18 U/L (ref 0–37)
Albumin: 4 g/dL (ref 3.5–5.2)
BILIRUBIN DIRECT: 0.1 mg/dL (ref 0.0–0.3)
BILIRUBIN INDIRECT: 0.4 mg/dL (ref 0.0–0.9)
Total Bilirubin: 0.5 mg/dL (ref 0.3–1.2)
Total Protein: 6.7 g/dL (ref 6.0–8.3)

## 2013-08-14 LAB — BASIC METABOLIC PANEL WITH GFR
BUN: 15 mg/dL (ref 6–23)
CHLORIDE: 93 meq/L — AB (ref 96–112)
CO2: 29 meq/L (ref 19–32)
Calcium: 9.6 mg/dL (ref 8.4–10.5)
Creat: 0.8 mg/dL (ref 0.50–1.10)
GFR, EST AFRICAN AMERICAN: 79 mL/min
GFR, Est Non African American: 68 mL/min
GLUCOSE: 274 mg/dL — AB (ref 70–99)
POTASSIUM: 4.3 meq/L (ref 3.5–5.3)
SODIUM: 130 meq/L — AB (ref 135–145)

## 2013-08-14 MED ORDER — SUCRALFATE 1 G PO TABS: 1.0000 g | ORAL_TABLET | Freq: Two times a day (BID) | ORAL | Status: AC

## 2013-08-14 NOTE — Patient Instructions (Signed)
Try the Welchol packets first. Please mix with 6-8oz water, stir, let sit 30 mins, then stir and drink.  Do this for 2-3 days. If this does not help you can try the carafate. You can either dissolve in water or take as a pill If this does not get better go to GI or ER if it gets worse.   Diet for Gastroesophageal Reflux Disease, Adult Reflux (acid reflux) is when acid from your stomach flows up into the esophagus. When acid comes in contact with the esophagus, the acid causes irritation and soreness (inflammation) in the esophagus. When reflux happens often or so severely that it causes damage to the esophagus, it is called gastroesophageal reflux disease (GERD). Nutrition therapy can help ease the discomfort of GERD. FOODS OR DRINKS TO AVOID OR LIMIT  Smoking or chewing tobacco. Nicotine is one of the most potent stimulants to acid production in the gastrointestinal tract.  Caffeinated and decaffeinated coffee and black tea.  Regular or low-calorie carbonated beverages or energy drinks (caffeine-free carbonated beverages are allowed).   Strong spices, such as black pepper, white pepper, red pepper, cayenne, curry powder, and chili powder.  Peppermint or spearmint.  Chocolate.  High-fat foods, including meats and fried foods. Extra added fats including oils, butter, salad dressings, and nuts. Limit these to less than 8 tsp per day.  Fruits and vegetables if they are not tolerated, such as citrus fruits or tomatoes.  Alcohol.  Any food that seems to aggravate your condition. If you have questions regarding your diet, call your caregiver or a registered dietitian. OTHER THINGS THAT MAY HELP GERD INCLUDE:   Eating your meals slowly, in a relaxed setting.  Eating 5 to 6 small meals per day instead of 3 large meals.  Eliminating food for a period of time if it causes distress.  Not lying down until 3 hours after eating a meal.  Keeping the head of your bed raised 6 to 9 inches (15  to 23 cm) by using a foam wedge or blocks under the legs of the bed. Lying flat may make symptoms worse.  Being physically active. Weight loss may be helpful in reducing reflux in overweight or obese adults.  Wear loose fitting clothing EXAMPLE MEAL PLAN This meal plan is approximately 2,000 calories based on [email or web address] meal planning guidelines. Breakfast   cup cooked oatmeal.  1 cup strawberries.  1 cup low-fat milk.  1 oz almonds. Snack  1 cup cucumber slices.  6 oz yogurt (made from low-fat or fat-free milk). Lunch  2 slice whole-wheat bread.  2 oz sliced [place].  2 tsp mayonnaise.  1 cup blueberries.  1 cup snap peas. Snack  6 whole-wheat crackers.  1 oz string cheese. Dinner   cup brown rice.  1 cup mixed veggies.  1 tsp olive oil.  3 oz grilled fish. Document Released: 07/24/2005 Document Revised: 10/16/2011 Document Reviewed: 06/09/2011 [facility] Patient Information 2014 [place], [place].

## 2013-08-14 NOTE — Progress Notes (Signed)
   Subjective:    Patient ID: [name], female    DOB: 1927-12-15, 80 y.o.   MRN: [id number]  Diarrhea  This is a recurrent (Since having her gallbladder out) problem. Episode onset: for one week, some formed stools last 2 days. Progression since onset: constant oozing but formed for past 3 days.  The stool consistency is described as watery (States it is yellpw or very pale. ). Associated symptoms include abdominal pain and bloating. Pertinent negatives include no chills, fever, headaches, sweats or URI. She has tried anti-motility drug (stopped ompeprazole) for the symptoms.    CT scan AB without contrast 2014.  FINDINGS:  The lung bases are clear. The liver is unremarkable in the  unenhanced state. Surgical clips are present from prior  cholecystectomy. The pancreas is unremarkable on this unenhanced  study. The adrenal glands and spleen are unremarkable. The stomach  is moderately fluid distended with contrast media with no  abnormality evident. No renal calculi are seen and there is no  evidence of hydronephrosis. The abdominal aorta is normal in caliber  with moderate atheromatous change present. No adenopathy is seen.  A moderate amount of feces is noted throughout the entire colon. No  colonic mucosal edema is seen. The terminal ileum is unremarkable.  The appendix is retrocecal and unremarkable. The urinary bladder is  moderately urine distended with no abnormality noted. A uterine  pessary is present. No free fluid is seen in the pelvis. A small  focus of dense calcification in the right pelvic sidewall may be  ovarian in origin but is of doubtful significance. There is diffuse  degenerative disc disease throughout the lumbar spine most marked at  L4-5 and L5-S1.  IMPRESSION:  1. Moderate to large amount of feces throughout the entire colon. No  bowel obstruction. No evidence of colonic mucosal edema.  2. The appendix is retrocecal and unremarkable. The terminal ileum   appears normal.  3. Diffuse degenerative disc disease throughout the lumbar spine.     Review of Systems  Constitutional: Positive for appetite change (decreased appetite) and unexpected weight change (weight down 4 lbs). Negative for fever and chills.  HENT: Negative.   Respiratory: Negative.   Cardiovascular: Negative.   Gastrointestinal: Positive for abdominal pain, diarrhea, abdominal distention and bloating.  Neurological: Positive for weakness. Negative for headaches.       Objective:   Physical Exam  Constitutional: Vital signs are normal. No distress.  HENT:  Head: Normocephalic and atraumatic.  Eyes: Conjunctivae are normal. Pupils are equal, round, and reactive to light.  Neck: Normal range of motion. Neck supple.  Cardiovascular: Normal rate, regular rhythm and normal heart sounds.   Pulmonary/Chest: Effort normal and breath sounds normal. She has no wheezes.  Abdominal: Soft. Bowel sounds are normal. There is tenderness (epigastric). There is rebound (questionable) and guarding.  Neurological: She is alert.  Skin: Skin is warm and dry. No rash noted.      Assessment & Plan:  Abdominal pain rule out: infection, pancreatitis, GERD, pale stool is concern for pancreatic/ductal obstruction but normal CT recently. We will treat symptoms first and get labs, if everything is negative we will send her to GI for possible imaging/ERCP. check labs, bland diet, small portions, increase H20  Patient advised to go to the ER if the symptoms increase or worsen.  Welchol samples and carafate sent with instructions.

## 2013-08-15 ENCOUNTER — Telehealth: Payer: Self-pay

## 2013-08-15 LAB — HEPATITIS A ANTIBODY, TOTAL: HEP A TOTAL AB: REACTIVE — AB

## 2013-08-15 LAB — HEPATITIS B SURFACE ANTIBODY,QUALITATIVE: Hep B S Ab: NEGATIVE

## 2013-08-15 LAB — TSH: TSH: 2.931 u[IU]/mL (ref 0.350–4.500)

## 2013-08-15 LAB — HEPATITIS C ANTIBODY: HCV Ab: NEGATIVE

## 2013-08-15 NOTE — Telephone Encounter (Signed)
Message copied by Nadyne Coombes on Fri Aug 15, 2013 12:40 PM ------      Message from: Vicie Mutters R      Created: Fri Aug 15, 2013  8:20 AM       All of your labs are normal except:      You liver and WBC are normal. Your sugar is very high, please decrease carbs. If the stomach pain continues go to GI.        ------

## 2013-08-15 NOTE — Telephone Encounter (Signed)
lmom to pt to return my call. 

## 2013-08-18 ENCOUNTER — Telehealth: Payer: Self-pay

## 2013-08-18 NOTE — Telephone Encounter (Signed)
Patient made aware of lab results and instructions regarding 08-14-13 visit

## 2013-08-19 ENCOUNTER — Other Ambulatory Visit: Payer: Self-pay | Admitting: Physician Assistant

## 2013-08-19 MED ORDER — CHOLESTYRAMINE 4 G PO PACK: PACK | ORAL | Status: DC

## 2013-08-28 ENCOUNTER — Encounter: Payer: Self-pay | Admitting: Internal Medicine

## 2013-08-28 ENCOUNTER — Other Ambulatory Visit: Payer: Self-pay | Admitting: Physician Assistant

## 2013-08-28 DIAGNOSIS — K219 Gastro-esophageal reflux disease without esophagitis: Secondary | ICD-10-CM

## 2013-08-28 DIAGNOSIS — R197 Diarrhea, unspecified: Secondary | ICD-10-CM

## 2013-09-22 ENCOUNTER — Ambulatory Visit: Payer: Medicare Other | Admitting: Internal Medicine

## 2013-10-01 ENCOUNTER — Ambulatory Visit (INDEPENDENT_AMBULATORY_CARE_PROVIDER_SITE_OTHER): Payer: Medicare Other | Admitting: Emergency Medicine

## 2013-10-01 ENCOUNTER — Ambulatory Visit (HOSPITAL_COMMUNITY)
Admission: RE | Admit: 2013-10-01 | Discharge: 2013-10-01 | Disposition: A | Discharge status: Home / Self Care | Payer: Medicare Other | Source: Ambulatory Visit | Attending: Emergency Medicine | Admitting: Emergency Medicine

## 2013-10-01 ENCOUNTER — Other Ambulatory Visit: Payer: Self-pay

## 2013-10-01 ENCOUNTER — Encounter: Payer: Self-pay | Admitting: Emergency Medicine

## 2013-10-01 ENCOUNTER — Encounter: Payer: Self-pay | Admitting: Internal Medicine

## 2013-10-01 VITALS — BP 142/78 | HR 100 | Temp 98.0°F | Resp 16 | Ht 61.0 in | Wt 116.0 lb

## 2013-10-01 DIAGNOSIS — R1013 Epigastric pain: Secondary | ICD-10-CM

## 2013-10-01 DIAGNOSIS — R5383 Other fatigue: Secondary | ICD-10-CM

## 2013-10-01 DIAGNOSIS — K219 Gastro-esophageal reflux disease without esophagitis: Secondary | ICD-10-CM

## 2013-10-01 DIAGNOSIS — R197 Diarrhea, unspecified: Secondary | ICD-10-CM

## 2013-10-01 DIAGNOSIS — E538 Deficiency of other specified B group vitamins: Secondary | ICD-10-CM

## 2013-10-01 DIAGNOSIS — R5381 Other malaise: Secondary | ICD-10-CM | POA: Insufficient documentation

## 2013-10-01 DIAGNOSIS — E039 Hypothyroidism, unspecified: Secondary | ICD-10-CM

## 2013-10-01 DIAGNOSIS — R32 Unspecified urinary incontinence: Secondary | ICD-10-CM

## 2013-10-01 DIAGNOSIS — I700 Atherosclerosis of aorta: Secondary | ICD-10-CM | POA: Insufficient documentation

## 2013-10-01 DIAGNOSIS — D649 Anemia, unspecified: Secondary | ICD-10-CM

## 2013-10-01 DIAGNOSIS — Z9010 Acquired absence of unspecified breast and nipple: Secondary | ICD-10-CM | POA: Insufficient documentation

## 2013-10-01 DIAGNOSIS — I10 Essential (primary) hypertension: Secondary | ICD-10-CM

## 2013-10-01 DIAGNOSIS — E782 Mixed hyperlipidemia: Secondary | ICD-10-CM

## 2013-10-01 DIAGNOSIS — R109 Unspecified abdominal pain: Secondary | ICD-10-CM | POA: Diagnosis present

## 2013-10-01 DIAGNOSIS — R7309 Other abnormal glucose: Secondary | ICD-10-CM

## 2013-10-01 LAB — CBC WITH DIFFERENTIAL/PLATELET
BASOS PCT: 1 % (ref 0–1)
Basophils Absolute: 0.1 10*3/uL (ref 0.0–0.1)
EOS ABS: 0.2 10*3/uL (ref 0.0–0.7)
Eosinophils Relative: 2 % (ref 0–5)
HEMATOCRIT: 39.9 % (ref 36.0–46.0)
HEMOGLOBIN: 13.1 g/dL (ref 12.0–15.0)
Lymphocytes Relative: 17 % (ref 12–46)
Lymphs Abs: 1.5 10*3/uL (ref 0.7–4.0)
MCH: 31.3 pg (ref 26.0–34.0)
MCHC: 32.8 g/dL (ref 30.0–36.0)
MCV: 95.5 fL (ref 78.0–100.0)
MONO ABS: 0.6 10*3/uL (ref 0.1–1.0)
MONOS PCT: 7 % (ref 3–12)
Neutro Abs: 6.4 10*3/uL (ref 1.7–7.7)
Neutrophils Relative %: 73 % (ref 43–77)
Platelets: 276 10*3/uL (ref 150–400)
RBC: 4.18 MIL/uL (ref 3.87–5.11)
RDW: 15.3 % (ref 11.5–15.5)
WBC: 8.7 10*3/uL (ref 4.0–10.5)

## 2013-10-01 NOTE — Patient Instructions (Signed)
Fatigue Fatigue is a feeling of tiredness, lack of energy, lack of motivation, or feeling tired all the time. Having enough rest, good nutrition, and reducing stress will normally reduce fatigue. Consult your caregiver if it persists. The nature of your fatigue will help your caregiver to find out its cause. The treatment is based on the cause.  CAUSES  There are many causes for fatigue. Most of the time, fatigue can be traced to one or more of your habits or routines. Most causes fit into one or more of three general areas. They are: Lifestyle problems  Sleep disturbances.  Overwork.  Physical exertion.  Unhealthy habits.  Poor eating habits or eating disorders.  Alcohol and/or drug use .  Lack of proper nutrition (malnutrition). Psychological problems  Stress and/or anxiety problems.  Depression.  Grief.  Boredom. Medical Problems or Conditions  Anemia.  Pregnancy.  Thyroid gland problems.  Recovery from major surgery.  Continuous pain.  Emphysema or asthma that is not well controlled  Allergic conditions.  Diabetes.  Infections (such as mononucleosis).  Obesity.  Sleep disorders, such as sleep apnea.  Heart failure or other heart-related problems.  Cancer.  Kidney disease.  Liver disease.  Effects of certain medicines such as antihistamines, cough and cold remedies, prescription pain medicines, heart and blood pressure medicines, drugs used for treatment of cancer, and some antidepressants. SYMPTOMS  The symptoms of fatigue include:   Lack of energy.  Lack of drive (motivation).  Drowsiness.  Feeling of indifference to the surroundings. DIAGNOSIS  The details of how you feel help guide your caregiver in finding out what is causing the fatigue. You will be asked about your present and past health condition. It is important to review all medicines that you take, including prescription and non-prescription items. A thorough exam will be done.  You will be questioned about your feelings, habits, and normal lifestyle. Your caregiver may suggest blood tests, urine tests, or other tests to look for common medical causes of fatigue.  TREATMENT  Fatigue is treated by correcting the underlying cause. For example, if you have continuous pain or depression, treating these causes will improve how you feel. Similarly, adjusting the dose of certain medicines will help in reducing fatigue.  HOME CARE INSTRUCTIONS   Try to get the required amount of good sleep every night.  Eat a healthy and nutritious diet, and drink enough water throughout the day.  Practice ways of relaxing (including yoga or meditation).  Exercise regularly.  Make plans to change situations that cause stress. Act on those plans so that stresses decrease over time. Keep your work and personal routine reasonable.  Avoid street drugs and minimize use of alcohol.  Start taking a daily multivitamin after consulting your caregiver. SEEK MEDICAL CARE IF:   You have persistent tiredness, which cannot be accounted for.  You have fever.  You have unintentional weight loss.  You have headaches.  You have disturbed sleep throughout the night.  You are feeling sad.  You have constipation.  You have dry skin.  You have gained weight.  You are taking any new or different medicines that you suspect are causing fatigue.  You are unable to sleep at night.  You develop any unusual swelling of your legs or other parts of your body. SEEK IMMEDIATE MEDICAL CARE IF:   You are feeling confused.  Your vision is blurred.  You feel faint or pass out.  You develop severe headache.  You develop severe abdominal, pelvic, or   back pain.  You develop chest pain, shortness of breath, or an irregular or fast heartbeat.  You are unable to pass a normal amount of urine.  You develop abnormal bleeding such as bleeding from the rectum or you vomit blood.  You have thoughts  about harming yourself or committing suicide.  You are worried that you might harm someone else. MAKE SURE YOU:   Understand these instructions.  Will watch your condition.  Will get help right away if you are not doing well or get worse. Document Released: 05/21/2007 Document Revised: 10/16/2011 Document Reviewed: 05/21/2007 [facility] Patient Information 2014 [place]. Constipation, Adult Constipation is when a person:  Poops (bowel movement) less than 3 times a week.  Has a hard time pooping.  Has poop that is dry, hard, or bigger than normal. HOME CARE   Eat more fiber, such as fruits, vegetables, whole grains like brown rice, and beans.  Eat less fatty foods and sugar. This includes [place] fries, hamburgers, cookies, candy, and soda.  If you are not getting enough fiber from food, take products with added fiber in them (supplements).  Drink enough fluid to keep your pee (urine) clear or pale yellow.  Go to the restroom when you feel like you need to poop. Do not hold it.  Only take medicine as told by your doctor. Do not take medicines that help you poop (laxatives) without talking to your doctor first.  Exercise on a regular basis, or as told by your doctor. GET HELP RIGHT AWAY IF:   You have bright red blood in your poop (stool).  Your constipation lasts more than 4 days or gets worse.  You have belly (abdomen) or butt (rectal) pain.  You have thin poop (as thin as a pencil).  You lose weight, and it cannot be explained. MAKE SURE YOU:   Understand these instructions.  Will watch your condition.  Will get help right away if you are not doing well or get worse. Document Released: 01/10/2008 Document Revised: 10/16/2011 Document Reviewed: 05/05/2013 [facility] Patient Information 2014 [place], [place].

## 2013-10-01 NOTE — Progress Notes (Signed)
Subjective:    Patient ID: [name], female    DOB: 06-04-1928, 80 y.o.   MRN: [id number]  HPI Comments: 80 yo female with an uncomfortable abdomen since GB surgery 2 years ago. She has chronic constipation but recently had to be put on medication for diarrhea. She notes the medicine for diarrhea helped with diarrhea only, she still has pale stools. She is using OTC Acid reducer which helped some and she canceled GI appointment. She stopped Acid reducer and now reflux/ epigastric pain has increased. She has noticed decreased appetite but is still eating smaller portions. She notes pain is worse in upper abdomen but diffuse all over. She had CT 10/14 of ABD /Pelvis WNL  She notes urine has been dark x 1 week. She denie any pain with urine. She notes increase frequency but has been drinking more water  She also presents for 3 month F/U for HTN, Cholesterol, Pre-Dm, D. Deficient. Se is not eating as healthy or exercising with recent increase in abdomen symptoms. She is feeling more tired with poor diet/ exercise.   Gastrophageal Reflux She complains of abdominal pain. Associated symptoms include fatigue.  Abdominal Pain Associated symptoms include constipation and diarrhea. Her past medical history is significant for GERD.   COMPARISON: CT abdomen pelvis of 10/05/2011  FINDINGS:  The lung bases are clear. The liver is unremarkable in the  unenhanced state. Surgical clips are present from prior  cholecystectomy. The pancreas is unremarkable on this unenhanced  study. The adrenal glands and spleen are unremarkable. The stomach  is moderately fluid distended with contrast media with no  abnormality evident. No renal calculi are seen and there is no  evidence of hydronephrosis. The abdominal aorta is normal in caliber  with moderate atheromatous change present. No adenopathy is seen.  A moderate amount of feces is noted throughout the entire colon. No  colonic mucosal edema is seen. The  terminal ileum is unremarkable.  The appendix is retrocecal and unremarkable. The urinary bladder is  moderately urine distended with no abnormality noted. A uterine  pessary is present. No free fluid is seen in the pelvis. A small  focus of dense calcification in the right pelvic sidewall may be  ovarian in origin but is of doubtful significance. There is diffuse  degenerative disc disease throughout the lumbar spine most marked at  L4-5 and L5-S1.  IMPRESSION:  1. Moderate to large amount of feces throughout the entire colon. No  bowel obstruction. No evidence of colonic mucosal edema.  2. The appendix is retrocecal and unremarkable. The terminal ileum  appears normal.  3. Diffuse degenerative disc disease throughout the lumbar spine.   Current Outpatient Prescriptions on File Prior to Visit  Medication Sig Dispense Refill  . estradiol (ESTRING) 2 MG vaginal ring Place 2 mg vaginally every 3 (three) months. follow package directions      . hydrochlorothiazide (HYDRODIURIL) 25 MG tablet Take 25 mg by mouth daily as needed.      Marland Kitchen HYDROcodone-acetaminophen (NORCO/VICODIN) 5-325 MG per tablet Take 1-2 tablets by mouth every 4 (four) hours as needed.  100 tablet  1  . levothyroxine (SYNTHROID, LEVOTHROID) 100 MCG tablet Take 50-100 mcg by mouth See admin instructions. Pt takes 1/2 tab for 50 mcg dose on Tuesday,thursday,saturday,sunday. 100 mcg  On Monday, Wednesday,friday      . metoprolol succinate (TOPROL-XL) 25 MG 24 hr tablet Take 25 mg by mouth daily.      . cimetidine (TAGAMET HB) 200  MG tablet 1 tab bid prn Hearttburn  & Reflux      . sucralfate (CARAFATE) 1 G tablet Take 1 tablet (1 g total) by mouth 2 (two) times daily.  60 tablet  5   No current facility-administered medications on file prior to visit.   Allergies  Allergen Reactions  . Aspirin     GI upset  . Gemfibrozil   . Nsaids Other (See Comments)    "irritates my stomach"  . Pravastatin     Muscle aches  . Pse  Hcl-Dm Hbr-Guaifenesin Tan     Palpitations   Past Medical History  Diagnosis Date  . Arthritis   . GERD (gastroesophageal reflux disease)   . Breast cancer   . Complication of anesthesia     Very hard to wake up after anesthesia  . Dizziness   . Seasonal allergies   . Frequency of urination   . Dysrhythmia     Sinus tachycardia can get up to 108. Started after chemo   . Hypertension   . Hyperlipidemia   . Thyroid disease   . Hypothyroidism      Review of Systems  Constitutional: Positive for fatigue.  Gastrointestinal: Positive for abdominal pain, diarrhea and constipation.  All other systems reviewed and are negative.   BP 142/78  Pulse 100  Temp(Src) 98 F (36.7 C) (Temporal)  Resp 16  Ht 5\' 1"  (1.549 m)  Wt 116 lb (52.617 kg)  BMI 21.93 kg/m2     Objective:   Physical Exam  Nursing note and vitals reviewed. Constitutional: She is oriented to person, place, and time. She appears well-developed and well-nourished. No distress.  HENT:  Head: Normocephalic and atraumatic.  Right Ear: External ear normal.  Left Ear: External ear normal.  Nose: Nose normal.  Mouth/Throat: Oropharynx is clear and moist.  Eyes: Conjunctivae and EOM are normal.  Neck: Normal range of motion. Neck supple. No JVD present. No thyromegaly present.  Cardiovascular: Normal rate, regular rhythm, normal heart sounds and intact distal pulses.   Pulmonary/Chest: Effort normal and breath sounds normal.  Abdominal: Soft. Bowel sounds are normal. She exhibits no distension and no mass. There is tenderness. There is no rebound and no guarding.  diffuse  Musculoskeletal: Normal range of motion. She exhibits no edema and no tenderness.  Lymphadenopathy:    She has no cervical adenopathy.  Neurological: She is alert and oriented to person, place, and time. No cranial nerve deficit.  Skin: Skin is warm and dry. No rash noted. No erythema. No pallor.  Psychiatric: She has a normal mood and affect.  Her behavior is normal. Judgment and thought content normal.          Assessment & Plan:  1.  3 month F/U for HTN, Cholesterol, Pre-Dm, D. Deficient. Needs healthy diet, cardio QD and obtain healthy weight. Check Labs, Check BP if >130/80 call office 2. GERD- Criss Rosales diet explained, Increase Prilosec to BID, call with results 3. Hypothyroid vs Anemia/ Fatigue- check labs, increase activity and H2O 4. Abdominal pain- Check lbs may need CT scan, get CXR/ ABD 2v, Needs re-eval with Dr Henrene Pastor- send referral w/c if SX increase or ER. 5. Urine frequency- check labs  OVER 40 minutes of exam, counseling, chart review, referral performed

## 2013-10-02 ENCOUNTER — Other Ambulatory Visit: Payer: Self-pay | Admitting: Emergency Medicine

## 2013-10-02 DIAGNOSIS — R109 Unspecified abdominal pain: Secondary | ICD-10-CM

## 2013-10-02 LAB — HEPATIC FUNCTION PANEL
ALBUMIN: 4.4 g/dL (ref 3.5–5.2)
ALK PHOS: 953 U/L — AB (ref 39–117)
ALT: 827 U/L — ABNORMAL HIGH (ref 0–35)
AST: 382 U/L — ABNORMAL HIGH (ref 0–37)
BILIRUBIN TOTAL: 5.9 mg/dL — AB (ref 0.2–1.2)
Bilirubin, Direct: 3.8 mg/dL — ABNORMAL HIGH (ref 0.0–0.3)
Indirect Bilirubin: 2.1 mg/dL — ABNORMAL HIGH (ref 0.2–1.2)
Total Protein: 7.1 g/dL (ref 6.0–8.3)

## 2013-10-02 LAB — URINALYSIS, MICROSCOPIC ONLY
Casts: NONE SEEN
Crystals: NONE SEEN

## 2013-10-02 LAB — URINALYSIS, ROUTINE W REFLEX MICROSCOPIC
Hgb urine dipstick: NEGATIVE
Leukocytes, UA: NEGATIVE
Nitrite: NEGATIVE
Protein, ur: NEGATIVE mg/dL
SPECIFIC GRAVITY, URINE: 1.013 (ref 1.005–1.030)
UROBILINOGEN UA: 0.2 mg/dL (ref 0.0–1.0)
pH: 6 (ref 5.0–8.0)

## 2013-10-02 LAB — BASIC METABOLIC PANEL WITH GFR
BUN: 21 mg/dL (ref 6–23)
CALCIUM: 10.5 mg/dL (ref 8.4–10.5)
CO2: 24 mEq/L (ref 19–32)
Chloride: 91 mEq/L — ABNORMAL LOW (ref 96–112)
Creat: 0.81 mg/dL (ref 0.50–1.10)
GFR, EST AFRICAN AMERICAN: 78 mL/min
GFR, Est Non African American: 67 mL/min
GLUCOSE: 349 mg/dL — AB (ref 70–99)
Potassium: 4.4 mEq/L (ref 3.5–5.3)
SODIUM: 127 meq/L — AB (ref 135–145)

## 2013-10-02 LAB — LIPID PANEL
CHOL/HDL RATIO: 4.6 ratio
Cholesterol: 260 mg/dL — ABNORMAL HIGH (ref 0–200)
HDL: 56 mg/dL (ref >39)
LDL Cholesterol: 171 mg/dL — ABNORMAL HIGH (ref 0–99)
Triglycerides: 164 mg/dL — ABNORMAL HIGH (ref <150)
VLDL: 33 mg/dL (ref 0–40)

## 2013-10-02 LAB — IRON AND TIBC
%SAT: 28 % (ref 20–55)
IRON: 109 ug/dL (ref 42–145)
TIBC: 389 ug/dL (ref 250–470)
UIBC: 280 ug/dL (ref 125–400)

## 2013-10-02 LAB — HEMOGLOBIN A1C
Hgb A1c MFr Bld: 9.4 % — ABNORMAL HIGH (ref <5.7)
Mean Plasma Glucose: 223 mg/dL — ABNORMAL HIGH (ref <117)

## 2013-10-02 LAB — LIPASE

## 2013-10-02 LAB — INSULIN, FASTING: Insulin fasting, serum: 7 u[IU]/mL (ref 3–28)

## 2013-10-02 LAB — AMYLASE: Amylase: 34 U/L (ref 0–105)

## 2013-10-02 LAB — TSH: TSH: 1.303 u[IU]/mL (ref 0.350–4.500)

## 2013-10-03 ENCOUNTER — Encounter (HOSPITAL_COMMUNITY): Payer: Self-pay | Admitting: Emergency Medicine

## 2013-10-03 ENCOUNTER — Inpatient Hospital Stay (HOSPITAL_COMMUNITY)
Admission: EM | Admit: 2013-10-03 | Discharge: 2013-10-05 | DRG: 435 | Disposition: A | Discharge status: Home / Self Care | Payer: Medicare Other | Attending: Internal Medicine | Admitting: Internal Medicine

## 2013-10-03 ENCOUNTER — Ambulatory Visit (HOSPITAL_COMMUNITY)
Admission: RE | Admit: 2013-10-03 | Discharge: 2013-10-03 | Disposition: A | Discharge status: Home / Self Care | Payer: Medicare Other | Source: Ambulatory Visit | Attending: Emergency Medicine | Admitting: Emergency Medicine

## 2013-10-03 ENCOUNTER — Encounter (HOSPITAL_COMMUNITY): Payer: Self-pay

## 2013-10-03 DIAGNOSIS — Z886 Allergy status to analgesic agent status: Secondary | ICD-10-CM

## 2013-10-03 DIAGNOSIS — E119 Type 2 diabetes mellitus without complications: Secondary | ICD-10-CM | POA: Diagnosis present

## 2013-10-03 DIAGNOSIS — K838 Other specified diseases of biliary tract: Secondary | ICD-10-CM

## 2013-10-03 DIAGNOSIS — R7401 Elevation of levels of liver transaminase levels: Secondary | ICD-10-CM | POA: Diagnosis present

## 2013-10-03 DIAGNOSIS — Z79899 Other long term (current) drug therapy: Secondary | ICD-10-CM

## 2013-10-03 DIAGNOSIS — Z9089 Acquired absence of other organs: Secondary | ICD-10-CM

## 2013-10-03 DIAGNOSIS — R7402 Elevation of levels of lactic acid dehydrogenase (LDH): Secondary | ICD-10-CM | POA: Diagnosis present

## 2013-10-03 DIAGNOSIS — Z9010 Acquired absence of unspecified breast and nipple: Secondary | ICD-10-CM

## 2013-10-03 DIAGNOSIS — K831 Obstruction of bile duct: Secondary | ICD-10-CM | POA: Diagnosis present

## 2013-10-03 DIAGNOSIS — R740 Nonspecific elevation of levels of transaminase and lactic acid dehydrogenase [LDH]: Secondary | ICD-10-CM

## 2013-10-03 DIAGNOSIS — R109 Unspecified abdominal pain: Secondary | ICD-10-CM

## 2013-10-03 DIAGNOSIS — Z888 Allergy status to other drugs, medicaments and biological substances status: Secondary | ICD-10-CM

## 2013-10-03 DIAGNOSIS — K8689 Other specified diseases of pancreas: Secondary | ICD-10-CM

## 2013-10-03 DIAGNOSIS — Z803 Family history of malignant neoplasm of breast: Secondary | ICD-10-CM

## 2013-10-03 DIAGNOSIS — R000 Tachycardia, unspecified: Secondary | ICD-10-CM

## 2013-10-03 DIAGNOSIS — E785 Hyperlipidemia, unspecified: Secondary | ICD-10-CM | POA: Diagnosis present

## 2013-10-03 DIAGNOSIS — Z87891 Personal history of nicotine dependence: Secondary | ICD-10-CM

## 2013-10-03 DIAGNOSIS — Z853 Personal history of malignant neoplasm of breast: Secondary | ICD-10-CM

## 2013-10-03 DIAGNOSIS — M129 Arthropathy, unspecified: Secondary | ICD-10-CM | POA: Diagnosis present

## 2013-10-03 DIAGNOSIS — E039 Hypothyroidism, unspecified: Secondary | ICD-10-CM | POA: Diagnosis present

## 2013-10-03 DIAGNOSIS — C250 Malignant neoplasm of head of pancreas: Principal | ICD-10-CM | POA: Diagnosis present

## 2013-10-03 DIAGNOSIS — R739 Hyperglycemia, unspecified: Secondary | ICD-10-CM

## 2013-10-03 DIAGNOSIS — K869 Disease of pancreas, unspecified: Secondary | ICD-10-CM

## 2013-10-03 DIAGNOSIS — K219 Gastro-esophageal reflux disease without esophagitis: Secondary | ICD-10-CM | POA: Diagnosis present

## 2013-10-03 DIAGNOSIS — I10 Essential (primary) hypertension: Secondary | ICD-10-CM | POA: Diagnosis present

## 2013-10-03 LAB — COMPREHENSIVE METABOLIC PANEL
ALBUMIN: 3.6 g/dL (ref 3.5–5.2)
ALK PHOS: 1143 U/L — AB (ref 39–117)
ALT: 669 U/L — ABNORMAL HIGH (ref 0–35)
AST: 315 U/L — ABNORMAL HIGH (ref 0–37)
BUN: 17 mg/dL (ref 6–23)
CO2: 26 meq/L (ref 19–32)
Calcium: 10.7 mg/dL — ABNORMAL HIGH (ref 8.4–10.5)
Chloride: 89 mEq/L — ABNORMAL LOW (ref 96–112)
Creatinine, Ser: 0.58 mg/dL (ref 0.50–1.10)
GFR calc Af Amer: >90 mL/min (ref >90)
GFR calc non Af Amer: 83 mL/min — ABNORMAL LOW (ref >90)
Glucose, Bld: 293 mg/dL — ABNORMAL HIGH (ref 70–99)
POTASSIUM: 4.7 meq/L (ref 3.7–5.3)
Sodium: 130 mEq/L — ABNORMAL LOW (ref 137–147)
Total Bilirubin: 8.9 mg/dL — ABNORMAL HIGH (ref 0.3–1.2)
Total Protein: 7.5 g/dL (ref 6.0–8.3)

## 2013-10-03 LAB — CBC WITH DIFFERENTIAL/PLATELET
BASOS ABS: 0.1 10*3/uL (ref 0.0–0.1)
BASOS PCT: 1 % (ref 0–1)
Eosinophils Absolute: 0.3 10*3/uL (ref 0.0–0.7)
Eosinophils Relative: 3 % (ref 0–5)
HCT: 36.2 % (ref 36.0–46.0)
HEMOGLOBIN: 12.7 g/dL (ref 12.0–15.0)
Lymphocytes Relative: 23 % (ref 12–46)
Lymphs Abs: 2.1 10*3/uL (ref 0.7–4.0)
MCH: 31.1 pg (ref 26.0–34.0)
MCHC: 35.1 g/dL (ref 30.0–36.0)
MCV: 88.7 fL (ref 78.0–100.0)
MONOS PCT: 11 % (ref 3–12)
Monocytes Absolute: 1 10*3/uL (ref 0.1–1.0)
NEUTROS ABS: 5.8 10*3/uL (ref 1.7–7.7)
NEUTROS PCT: 62 % (ref 43–77)
Platelets: 266 10*3/uL (ref 150–400)
RBC: 4.08 MIL/uL (ref 3.87–5.11)
RDW: 15.4 % (ref 11.5–15.5)
WBC: 9.3 10*3/uL (ref 4.0–10.5)

## 2013-10-03 LAB — URINE CULTURE
Colony Count: NO GROWTH
Organism ID, Bacteria: NO GROWTH

## 2013-10-03 LAB — LIPASE, BLOOD: Lipase: 10 U/L — ABNORMAL LOW (ref 11–59)

## 2013-10-03 MED ORDER — PANTOPRAZOLE SODIUM 40 MG IV SOLR
40.0000 mg | INTRAVENOUS | Status: DC
  Administered 2013-10-03 – 2013-10-04 (×2): 40 mg via INTRAVENOUS
  Filled 2013-10-03 (×3): qty 40

## 2013-10-03 MED ORDER — SODIUM CHLORIDE 0.9 % IV SOLN
INTRAVENOUS | Status: DC
  Administered 2013-10-03 – 2013-10-05 (×4): via INTRAVENOUS

## 2013-10-03 MED ORDER — LEVOTHYROXINE SODIUM 100 MCG PO TABS
100.0000 ug | ORAL_TABLET | ORAL | Status: DC
  Filled 2013-10-03: qty 1

## 2013-10-03 MED ORDER — CIPROFLOXACIN IN D5W 400 MG/200ML IV SOLN
400.0000 mg | Freq: Two times a day (BID) | INTRAVENOUS | Status: DC
  Administered 2013-10-03 – 2013-10-05 (×5): 400 mg via INTRAVENOUS
  Filled 2013-10-03 (×5): qty 200

## 2013-10-03 MED ORDER — ONDANSETRON HCL 4 MG/2ML IJ SOLN: 4.0000 mg | Freq: Once | INTRAMUSCULAR | Status: DC

## 2013-10-03 MED ORDER — IOHEXOL 300 MG/ML  SOLN
100.0000 mL | Freq: Once | INTRAMUSCULAR | Status: AC | PRN
Start: 2013-10-03 — End: 2013-10-03
  Administered 2013-10-03: 100 mL via INTRAVENOUS

## 2013-10-03 MED ORDER — LEVOTHYROXINE SODIUM 50 MCG PO TABS: 50.0000 ug | ORAL_TABLET | ORAL | Status: DC

## 2013-10-03 MED ORDER — MORPHINE SULFATE 2 MG/ML IJ SOLN: 2.0000 mg | INTRAMUSCULAR | Status: DC | PRN

## 2013-10-03 MED ORDER — SUCRALFATE 1 G PO TABS
1.0000 g | ORAL_TABLET | Freq: Two times a day (BID) | ORAL | Status: DC
  Administered 2013-10-03 – 2013-10-05 (×4): 1 g via ORAL
  Filled 2013-10-03 (×5): qty 1

## 2013-10-03 MED ORDER — LEVOTHYROXINE SODIUM 50 MCG PO TABS
50.0000 ug | ORAL_TABLET | ORAL | Status: DC
  Administered 2013-10-04 – 2013-10-05 (×2): 50 ug via ORAL
  Filled 2013-10-03 (×2): qty 1

## 2013-10-03 MED ORDER — SODIUM CHLORIDE 0.9 % IV SOLN
1000.0000 mL | INTRAVENOUS | Status: DC
  Administered 2013-10-03: 1000 mL via INTRAVENOUS

## 2013-10-03 MED ORDER — ONDANSETRON HCL 4 MG PO TABS: 4.0000 mg | ORAL_TABLET | Freq: Four times a day (QID) | ORAL | Status: DC | PRN

## 2013-10-03 MED ORDER — METOPROLOL SUCCINATE ER 25 MG PO TB24
25.0000 mg | ORAL_TABLET | Freq: Every day | ORAL | Status: DC
  Administered 2013-10-03 – 2013-10-04 (×2): 25 mg via ORAL
  Filled 2013-10-03 (×3): qty 1

## 2013-10-03 MED ORDER — ONDANSETRON HCL 4 MG/2ML IJ SOLN: 4.0000 mg | Freq: Four times a day (QID) | INTRAMUSCULAR | Status: DC | PRN

## 2013-10-03 MED ORDER — IOHEXOL 300 MG/ML  SOLN
50.0000 mL | Freq: Once | INTRAMUSCULAR | Status: AC | PRN
  Administered 2013-10-03: 50 mL via ORAL

## 2013-10-03 MED ORDER — ENOXAPARIN SODIUM 40 MG/0.4ML ~~LOC~~ SOLN: 40.0000 mg | SUBCUTANEOUS | Status: DC

## 2013-10-03 MED ORDER — OXYCODONE HCL 5 MG PO TABS: 5.0000 mg | ORAL_TABLET | ORAL | Status: DC | PRN

## 2013-10-03 NOTE — ED Notes (Signed)
Per pt, states she has been having abdominal pain-saw PCP on Wed-sent for CT and was told to come to ED for eval r/t abnormal result-jaundice

## 2013-10-03 NOTE — ED Notes (Addendum)
Initial Contact - pt resting on stretcher, family at bs.  Pt reports sent from PCP with abnormal CT results.  Pt reports diffuse abd pain and intermittent nausea x2 years "after they took my gallbladder out".  Pt reports 1-2/10 pain at this time, declines intervention.  Skin PWD.  MAEI.  Speaking full/clear sentences, rr even/un-lab.  Denies nausea at this time.  NAD.

## 2013-10-03 NOTE — ED Notes (Signed)
Attempted to call report to floor, unable to take at this time.  RN will return the call.

## 2013-10-03 NOTE — ED Notes (Signed)
Surgeon at bs for consult.

## 2013-10-03 NOTE — ED Notes (Signed)
inpt md at bs for eval.  

## 2013-10-03 NOTE — H&P (Signed)
History and Physical  [name] BZJ:[id number] DOB: 1928-04-13 DOA: 10/03/2013  Referring physician: Dr. Dorie Rank PCP: [name], MD   Chief Complaint: Abdominal pain   History of Present Illness: [name] is an 80 y.o. female with a PMH of remote breast cancer, cholecystitis, cholelithiasis and gallstone pancreatitis, status post cholecystectomy 10/2011, who has had ongoing GI complaints since her surgery with persistent reflux type symptoms and abdominal discomfort.  Her primary care physician has been evaluating her persistent reflux and abdominal discomfort. The patient notes that she has had some recent diarrhea which improved with anti-diarrheal medications. She has been using over-the-counter acid suppressing medications which have helped but the symptoms return when she does not take them. She has had diminished appetite with weight loss. She has had pale stools, dark urine and itchy skin. Over the past few days, she noticed a yellow tone to her skin color. The patient had blood work at Dr. Idell Pickles office last week which showed liver function abnormalities, and she subsequently was scheduled for a CT scan of the abdomen and pelvis which was done today, and showed a pancreatic mass. Patient was then advised to come to the ER by her PCP for further evaluation of the mass. Of note, a CT scan of the abdomen done 10/14 was within normal limits.  Review of Systems: Constitutional: No fever, no chills;  Appetite decreased; + 10 lb weight loss over a short period of time, no weight gain, + fatigue.  HEENT: No blurry vision, no diplopia, no pharyngitis, no dysphagia CV: No chest pain, no palpitations, no PND.  Resp: No SOB, no cough, no pleuritic pain. GI: No nausea, no vomiting, + diarrhea, no melena, no hematochezia, no constipation.  GU: No dysuria, no hematuria, no frequency, no urgency. MSK: no myalgias, + left knee and back arthralgias.  Neuro:  No headache, no focal  neurological deficits, no history of seizures.  Psych: No depression, no anxiety.  Endo: No heat intolerance, +cold intolerance, no polyuria, no polydipsia  Skin: No rashes, no skin lesions.  Heme: No easy bruising.  Travel history: None recent.  Past Medical History Past Medical History  Diagnosis Date  . Arthritis     Left knee, s/p cortisone injection  . GERD (gastroesophageal reflux disease)   . Complication of anesthesia     Very hard to wake up after anesthesia  . Dizziness   . Seasonal allergies   . Frequency of urination   . Dysrhythmia     Sinus tachycardia can get up to 108. Started after chemo   . Hypertension   . Hyperlipidemia   . Thyroid disease   . Hypothyroidism   . Breast cancer 2001    S/P mastectomy, XRT and chemo     Past Surgical History Past Surgical History  Procedure Laterality Date  . Joint replacement      right knee  . Carpal tunnel release      bilateral  . Incontinence surgery    . Cataract extraction      left  . Mastectomy      right due to cancer  . Ercp  10/06/2011    Procedure: ENDOSCOPIC RETROGRADE CHOLANGIOPANCREATOGRAPHY (ERCP);  Surgeon: Gatha Mayer, MD;  Location: [facility];  Service: Endoscopy;  Laterality: N/A;  or general per anesthesia  . Sphincterotomy  10/06/2011    Procedure: SPHINCTEROTOMY;  Surgeon: Gatha Mayer, MD;  Location: [facility];  Service: Endoscopy;;  . Cholecystectomy  10/07/2011  Procedure: LAPAROSCOPIC CHOLECYSTECTOMY WITH INTRAOPERATIVE CHOLANGIOGRAM;  Surgeon: Mariella Saa, MD;  Location: [facility];  Service: General;  Laterality: N/A;  . Tonsillectomy    . Colonoscopy    . Eye surgery    . Lumbar laminectomy/decompression microdiscectomy  05/27/2012    Procedure: LUMBAR LAMINECTOMY/DECOMPRESSION MICRODISCECTOMY 1 LEVEL;  Surgeon: Cristi Loron, MD;  Location: [facility];  Service: Neurosurgery;  Laterality: Bilateral;  Lumbar four laminectomy, with bilateral lumbar three laminotomies.      Social History: History   Social History  . Marital Status: Widowed    Spouse Name: N/A    Number of Children: 2  . Years of Education: N/A   Occupational History  . Not on file.   Social History Main Topics  . Smoking status: Former Smoker    Quit date: 10/01/1957  . Smokeless tobacco: Never Used     Comment: Quit 50 years ago.  . Alcohol Use: Yes     Comment: occasionally; socially; wine  . Drug Use: No  . Sexual Activity: Not on file   Other Topics Concern  . Not on file   Social History Narrative   Widowed.  Lives alone, ambulates with a cane.    Family History:  Family History  Problem Relation Age of Onset  . Stroke Mother   . Breast cancer Mother   . Heart disease Mother   . Stroke Father   . Heart disease Father   . Heart attack Father   . Breast cancer Daughter   . Cerebral aneurysm Sister   . Hyperlipidemia Son   . Hypertension Son     Allergies: Aspirin; Gemfibrozil; Nsaids; Pravastatin; and Pse hcl-dm hbr-guaifenesin tan  Meds: Prior to Admission medications   Medication Sig Start Date End Date Taking? Authorizing Provider  acetaminophen (TYLENOL) 500 MG tablet Take 1,000 mg by mouth 2 (two) times daily.   Yes Historical Provider, MD  estradiol (ESTRING) 2 MG vaginal ring Place 2 mg vaginally every 3 (three) months. follow package directions   Yes Historical Provider, MD  hydrochlorothiazide (HYDRODIURIL) 25 MG tablet Take 25 mg by mouth daily as needed (swelling).    Yes Historical Provider, MD  levothyroxine (SYNTHROID, LEVOTHROID) 100 MCG tablet Take 50-100 mcg by mouth See admin instructions. Pt takes 1/2 tab for 50 mcg dose on Tuesday,thursday,saturday,sunday. 100 mcg  On Monday, Wednesday,friday   Yes Historical Provider, MD  metoprolol succinate (TOPROL-XL) 25 MG 24 hr tablet Take 25 mg by mouth daily.   Yes Historical Provider, MD  omeprazole (PRILOSEC) 20 MG capsule Take 20 mg by mouth 2 (two) times daily.    Yes Historical Provider,  MD  sucralfate (CARAFATE) 1 G tablet Take 1 tablet (1 g total) by mouth 2 (two) times daily. 08/14/13 08/14/14 Yes Quentin Mulling, PA-C    Physical Exam: Filed Vitals:   10/03/13 1449 10/03/13 1450 10/03/13 1758  BP: 185/155 136/86 155/85  Pulse: 108  95  Temp: 97.8 F (36.6 C)  98 F (36.7 C)  TempSrc: Oral  Oral  Resp: 17  14  SpO2: 97%  97%     Physical Exam: Blood pressure 155/85, pulse 95, temperature 98 F (36.7 C), temperature source Oral, resp. rate 14, SpO2 97.00%. Gen: No acute distress. Head: Normocephalic, atraumatic. Eyes: PERRL, EOMI, sclerae nonicteric. Mouth: Oropharynx clear with no posterior pharyngeal exudates. Neck: Supple, no thyromegaly, no lymphadenopathy, no jugular venous distention. Chest: Lungs clear to auscultation bilaterally. CV: Heart sounds are mildly tachycardic. No murmurs, rubs,  or gallops. Abdomen: Soft, nontender, nondistended with normal active bowel sounds. Extremities: Extremities are with trace edema. Skin: Warm and dry. Mildly jaundiced. Neuro: Alert and oriented times 3; cranial nerves II through XII grossly intact. Psych: Mood and affect normal.  Labs on Admission:  Basic Metabolic Panel:  Recent Labs Lab 10/01/13 1318 10/03/13 1605  NA 127* 130*  K 4.4 4.7  CL 91* 89*  CO2 24 26  GLUCOSE 349* 293*  BUN 21 17  CREATININE 0.81 0.58  CALCIUM 10.5 10.7*   Liver Function Tests:  Recent Labs Lab 10/01/13 1318 10/03/13 1605  AST 382* 315*  ALT 827* 669*  ALKPHOS 953* 1143*  BILITOT 5.9* 8.9*  PROT 7.1 7.5  ALBUMIN 4.4 3.6    Recent Labs Lab 10/01/13 1318 10/03/13 1605  LIPASE <10 10*  AMYLASE 34  --    CBC:  Recent Labs Lab 10/01/13 1318 10/03/13 1605  WBC 8.7 9.3  NEUTROABS 6.4 5.8  HGB 13.1 12.7  HCT 39.9 36.2  MCV 95.5 88.7  PLT 276 266    Radiological Exams on Admission: Ct Abdomen Pelvis W Contrast  10/03/2013   CLINICAL DATA:  Mid to lower abdominal pain  EXAM: CT ABDOMEN AND PELVIS WITH  CONTRAST  TECHNIQUE: Multidetector CT imaging of the abdomen and pelvis was performed using the standard protocol following bolus administration of intravenous contrast.  CONTRAST:  121mL OMNIPAQUE IOHEXOL 300 MG/ML  SOLN  COMPARISON:  05/22/2013  FINDINGS: There is no pleural or pericardial effusion identified. There is diffuse intrahepatic bile duct dilatation. The common bile duct is markedly dilated measuring up to 2.5 cm, image 24/series 2. There is a mass within the head and uncinate process of the pancreas which measures 3.1 cm, image 25/ series 602. There is not only obstruction of the common bile duct, but the pancreatic duct is also obstructed and dilated. The mass appears to touch but does not encase the superior mesenteric artery, image 29/series 2. The mass also appears to abut the posterior aspect of the portal vein, image 27/series 2. The lumen of the portal vein and superior mesenteric arteries are not compromised. The celiac artery appears uninvolved the spleen is normal. The splenic vein is patent  The adrenal glands both appear normal. Normal appearance of both kidneys. The urinary bladder appears normal. A pessary device is identified within the dome of vagina. Next  Calcified atherosclerotic disease involves the abdominal aorta. There is no aneurysm. No upper abdominal adenopathy. There is no pelvic or inguinal adenopathy identified.  The stomach is normal. The small bowel loops have a normal appearance without obstruction. The appendix is visualized and appears normal. Normal appearance of the colon.  Review of the visualized bony structures is significant for scoliosis and multi level degenerative disc disease. No aggressive lytic or sclerotic bone lesions.  IMPRESSION: 1. Mass within the head and uncinate process of the pancreas obstructs both the common bile duct and pancreatic duct. 2. No specific features identified to suggest metastatic disease.   Electronically Signed   By: Kerby Moors M.D.   On: 10/03/2013 13:49    Assessment/Plan Principal Problem:   Obstructive jaundice and transaminitis secondary to pancreatic mass CT scan findings worrisome for malignant obstruction. GI has been consulted for consideration of ERCP. Will need biopsies and possible stent placement. Gently hydrate for now. Active Problems:   Hypothyroidism Continue home dose of Synthroid.   GERD (gastroesophageal reflux disease) Placed on IV Protonix and continue Carafate.   Tachycardia  Chronic. Continue beta blocker.   DVT prophylaxis Lovenox.  Code Status: Full. Family Communication: Tinslee Derocco (son) [phone], [phone].  Joya Salm [phone]. Disposition Plan: Home when stable.  Time spent: 70 minutes.  Kaydin Karbowski [facility] Pager [phone] Cell: [phone]   If 7PM-7AM, please contact night-coverage [email or web address] Password [facility] 10/03/2013, 6:44 PM    **Disclaimer: This note was dictated with voice recognition software. Similar sounding words can inadvertently be transcribed and this note may contain transcription errors which may not have been corrected upon publication of note.**

## 2013-10-03 NOTE — Consult Note (Signed)
Reason for Consult: Obstructive jaundice Referring Physician: [facility]  [name] HPI: This is an 80 year old female admitted for obstructive jaundice.  She had a history of gallstone pancreatitis in 09/2011 and she actually had an ERCP with Dr. Carlean Purl.  A lap chole was performed and since that time she has not felt the same.  She reports having issues with her abdomen for the past several years. Two to three weeks ago she started to notice dark urine, but no jaundice.  She had some complaints of fatigue and other nonspecific symptoms.  This past Wednesday she started to have issues with jaundice and blood work was drawn.  A CT scan was scheduled and performed today with findings of a pancreatic head mass resulting in a double duct sign.  As a result of the imaging she was admitted to the hospital for further evaluation and treatment.  She was supposed to see Dr. Olevia Perches, but her appointment was for April 1st.    Past Medical History  Diagnosis Date  . Arthritis     Left knee, s/p cortisone injection  . GERD (gastroesophageal reflux disease)   . Complication of anesthesia     Very hard to wake up after anesthesia  . Dizziness   . Seasonal allergies   . Frequency of urination   . Dysrhythmia     Sinus tachycardia can get up to 108. Started after chemo   . Hypertension   . Hyperlipidemia   . Thyroid disease   . Hypothyroidism   . Breast cancer 2001    S/P mastectomy, XRT and chemo    Past Surgical History  Procedure Laterality Date  . Joint replacement      right knee  . Carpal tunnel release      bilateral  . Incontinence surgery    . Cataract extraction      left  . Mastectomy      right due to cancer  . Ercp  10/06/2011    Procedure: ENDOSCOPIC RETROGRADE CHOLANGIOPANCREATOGRAPHY (ERCP);  Surgeon: Gatha Mayer, MD;  Location: [facility];  Service: Endoscopy;  Laterality: N/A;  or general per anesthesia  . Sphincterotomy  10/06/2011    Procedure: SPHINCTEROTOMY;   Surgeon: Gatha Mayer, MD;  Location: [facility];  Service: Endoscopy;;  . Cholecystectomy  10/07/2011    Procedure: LAPAROSCOPIC CHOLECYSTECTOMY WITH INTRAOPERATIVE CHOLANGIOGRAM;  Surgeon: Edward Jolly, MD;  Location: [facility];  Service: General;  Laterality: N/A;  . Tonsillectomy    . Colonoscopy    . Eye surgery    . Lumbar laminectomy/decompression microdiscectomy  05/27/2012    Procedure: LUMBAR LAMINECTOMY/DECOMPRESSION MICRODISCECTOMY 1 LEVEL;  Surgeon: Ophelia Charter, MD;  Location: [facility];  Service: Neurosurgery;  Laterality: Bilateral;  Lumbar four laminectomy, with bilateral lumbar three laminotomies.    Family History  Problem Relation Age of Onset  . Stroke Mother   . Breast cancer Mother   . Heart disease Mother   . Stroke Father   . Heart disease Father   . Heart attack Father   . Breast cancer Daughter   . Cerebral aneurysm Sister   . Hyperlipidemia Son   . Hypertension Son     Social History:  reports that she quit smoking about 56 years ago. She has never used smokeless tobacco. She reports that she drinks alcohol. She reports that she does not use illicit drugs.  Allergies:  Allergies  Allergen Reactions  . Aspirin     GI  upset  . Gemfibrozil   . Nsaids Other (See Comments)    "irritates my stomach"  . Pravastatin     Muscle aches  . Pse Hcl-Dm Hbr-Guaifenesin Tan     Palpitations    Medications:  Scheduled: . enoxaparin (LOVENOX) injection  40 mg Subcutaneous Q24H  . levothyroxine  50-100 mcg Oral See admin instructions  . metoprolol succinate  25 mg Oral Daily  . ondansetron  4 mg Intravenous Once  . pantoprazole (PROTONIX) IV  40 mg Intravenous Q24H  . sucralfate  1 g Oral BID   Continuous: . sodium chloride      Results for orders placed during the hospital encounter of 10/03/13 (from the past 24 hour(s))  CBC WITH DIFFERENTIAL     Status: None   Collection Time    10/03/13  4:05 PM      Result Value Ref Range   WBC 9.3   4.0 - 10.5 K/uL   RBC 4.08  3.87 - 5.11 MIL/uL   Hemoglobin 12.7  12.0 - 15.0 g/dL   HCT 36.2  36.0 - 46.0 %   MCV 88.7  78.0 - 100.0 fL   MCH 31.1  26.0 - 34.0 pg   MCHC 35.1  30.0 - 36.0 g/dL   RDW 15.4  11.5 - 15.5 %   Platelets 266  150 - 400 K/uL   Neutrophils Relative % 62  43 - 77 %   Neutro Abs 5.8  1.7 - 7.7 K/uL   Lymphocytes Relative 23  12 - 46 %   Lymphs Abs 2.1  0.7 - 4.0 K/uL   Monocytes Relative 11  3 - 12 %   Monocytes Absolute 1.0  0.1 - 1.0 K/uL   Eosinophils Relative 3  0 - 5 %   Eosinophils Absolute 0.3  0.0 - 0.7 K/uL   Basophils Relative 1  0 - 1 %   Basophils Absolute 0.1  0.0 - 0.1 K/uL  COMPREHENSIVE METABOLIC PANEL     Status: Abnormal   Collection Time    10/03/13  4:05 PM      Result Value Ref Range   Sodium 130 (*) 137 - 147 mEq/L   Potassium 4.7  3.7 - 5.3 mEq/L   Chloride 89 (*) 96 - 112 mEq/L   CO2 26  19 - 32 mEq/L   Glucose, Bld 293 (*) 70 - 99 mg/dL   BUN 17  6 - 23 mg/dL   Creatinine, Ser 0.58  0.50 - 1.10 mg/dL   Calcium 10.7 (*) 8.4 - 10.5 mg/dL   Total Protein 7.5  6.0 - 8.3 g/dL   Albumin 3.6  3.5 - 5.2 g/dL   AST 315 (*) 0 - 37 U/L   ALT 669 (*) 0 - 35 U/L   Alkaline Phosphatase 1143 (*) 39 - 117 U/L   Total Bilirubin 8.9 (*) 0.3 - 1.2 mg/dL   GFR calc non Af Amer 83 (*) >90 mL/min   GFR calc Af Amer >90  >90 mL/min  LIPASE, BLOOD     Status: Abnormal   Collection Time    10/03/13  4:05 PM      Result Value Ref Range   Lipase 10 (*) 11 - 59 U/L     Ct Abdomen Pelvis W Contrast  10/03/2013   CLINICAL DATA:  Mid to lower abdominal pain  EXAM: CT ABDOMEN AND PELVIS WITH CONTRAST  TECHNIQUE: Multidetector CT imaging of the abdomen and pelvis was performed using the standard  protocol following bolus administration of intravenous contrast.  CONTRAST:  175mL OMNIPAQUE IOHEXOL 300 MG/ML  SOLN  COMPARISON:  05/22/2013  FINDINGS: There is no pleural or pericardial effusion identified. There is diffuse intrahepatic bile duct dilatation.  The common bile duct is markedly dilated measuring up to 2.5 cm, image 24/series 2. There is a mass within the head and uncinate process of the pancreas which measures 3.1 cm, image 25/ series 602. There is not only obstruction of the common bile duct, but the pancreatic duct is also obstructed and dilated. The mass appears to touch but does not encase the superior mesenteric artery, image 29/series 2. The mass also appears to abut the posterior aspect of the portal vein, image 27/series 2. The lumen of the portal vein and superior mesenteric arteries are not compromised. The celiac artery appears uninvolved the spleen is normal. The splenic vein is patent  The adrenal glands both appear normal. Normal appearance of both kidneys. The urinary bladder appears normal. A pessary device is identified within the dome of vagina. Next  Calcified atherosclerotic disease involves the abdominal aorta. There is no aneurysm. No upper abdominal adenopathy. There is no pelvic or inguinal adenopathy identified.  The stomach is normal. The small bowel loops have a normal appearance without obstruction. The appendix is visualized and appears normal. Normal appearance of the colon.  Review of the visualized bony structures is significant for scoliosis and multi level degenerative disc disease. No aggressive lytic or sclerotic bone lesions.  IMPRESSION: 1. Mass within the head and uncinate process of the pancreas obstructs both the common bile duct and pancreatic duct. 2. No specific features identified to suggest metastatic disease.   Electronically Signed   By: Kerby Moors M.D.   On: 10/03/2013 13:49    ROS:  As stated above in the HPI otherwise negative.  Blood pressure 155/85, pulse 95, temperature 98 F (36.7 C), temperature source Oral, resp. rate 14, SpO2 97.00%.    PE: Gen: NAD, Alert and Oriented HEENT:  [place]/AT, EOMI Neck: Supple, no LAD Lungs: CTA Bilaterally CV: RRR without M/G/R ABM: Soft, NTND, +BS Ext: No  C/C/E  Assessment/Plan: 1) Pancreatic head mass. 2) Obstructive jaundice.   There is a definite increase in her liver enzymes over the past month.  It was a rather acute change.  I am highly suspicious that she has a pancreatic adenocarcinoma.  Further evaluation and treatment will be pursued.  I discuss with them the recent press about the duodenoscopes and CRE.  I have not known of a case occurring here and they want to pursue the procedure.  Plan: 1) ERCP tomorrow. 2) Ciprofloxacin. 3) EUS in the near future. 4) Check Ca-19-9 level.  Delbra Zellars D 10/03/2013, 8:08 PM

## 2013-10-03 NOTE — ED Provider Notes (Addendum)
CSN: [id number]     Arrival date & time 10/03/13  1430 History   First MD Initiated Contact with Patient 10/03/13 1531     Chief Complaint  Patient presents with  . Abdominal Pain  . abnormal xray     HPI Pt states she felt like she has had pain in her abdomen ever since gallbladder surgery a couple of years ago.  She has had this followed by her doctor with routine blood tests and scans.  Previously the evaluations had been normal.  About two weeks ago she started to feel poorly which was unusual.  She feels weak.  Her PCP wanted her to see a gastroenterologist but she has not been able to see them yet.  She saw her PCP office who did blood tests and repeated a CT scan.   The results show elevated liver enzymes and the CT scan shows a mass at the pancreatic head.  She was told by her PCP to come to the ED.  Past Medical History  Diagnosis Date  . Arthritis   . GERD (gastroesophageal reflux disease)   . Complication of anesthesia     Very hard to wake up after anesthesia  . Dizziness   . Seasonal allergies   . Frequency of urination   . Dysrhythmia     Sinus tachycardia can get up to 108. Started after chemo   . Hypertension   . Hyperlipidemia   . Thyroid disease   . Hypothyroidism   . Breast cancer    Past Surgical History  Procedure Laterality Date  . Joint replacement      right knee  . Carpal tunnel release      bilateral  . Incontinence surgery    . Cataract extraction      left  . Mastectomy      right due to cancer  . Ercp  10/06/2011    Procedure: ENDOSCOPIC RETROGRADE CHOLANGIOPANCREATOGRAPHY (ERCP);  Surgeon: Gatha Mayer, MD;  Location: [facility];  Service: Endoscopy;  Laterality: N/A;  or general per anesthesia  . Sphincterotomy  10/06/2011    Procedure: SPHINCTEROTOMY;  Surgeon: Gatha Mayer, MD;  Location: [facility];  Service: Endoscopy;;  . Cholecystectomy  10/07/2011    Procedure: LAPAROSCOPIC CHOLECYSTECTOMY WITH INTRAOPERATIVE CHOLANGIOGRAM;  Surgeon:  Edward Jolly, MD;  Location: [facility];  Service: General;  Laterality: N/A;  . Tonsillectomy    . Colonoscopy    . Eye surgery    . Lumbar laminectomy/decompression microdiscectomy  05/27/2012    Procedure: LUMBAR LAMINECTOMY/DECOMPRESSION MICRODISCECTOMY 1 LEVEL;  Surgeon: Ophelia Charter, MD;  Location: [facility];  Service: Neurosurgery;  Laterality: Bilateral;  Lumbar four laminectomy, with bilateral lumbar three laminotomies.   Family History  Problem Relation Age of Onset  . Stroke Mother   . Breast cancer Mother   . Heart disease Mother   . Stroke Father   . Heart disease Father   . Heart attack Father   . Breast cancer Daughter   . Cerebral aneurysm Sister   . Hyperlipidemia Son   . Hypertension Son    History  Substance Use Topics  . Smoking status: Former Smoker    Quit date: 10/01/1957  . Smokeless tobacco: Never Used     Comment: Quit 50 years ago.  . Alcohol Use: Yes     Comment: occasionally; socially; wine   OB History   Grav Para Term Preterm Abortions TAB SAB Ect Mult Living  Review of Systems  Constitutional: Positive for unexpected weight change (wt loss, ?10 lbs). Negative for fever.  Respiratory: Negative for shortness of breath.   Cardiovascular: Negative for chest pain.  Gastrointestinal: Positive for abdominal pain. Negative for vomiting and diarrhea.  All other systems reviewed and are negative.      Allergies  Aspirin; Gemfibrozil; Nsaids; Pravastatin; and Pse hcl-dm hbr-guaifenesin tan  Home Medications   Current Outpatient Rx  Name  Route  Sig  Dispense  Refill  . cimetidine (TAGAMET HB) 200 MG tablet      1 tab bid prn Hearttburn  & Reflux         . estradiol (ESTRING) 2 MG vaginal ring   Vaginal   Place 2 mg vaginally every 3 (three) months. follow package directions         . hydrochlorothiazide (HYDRODIURIL) 25 MG tablet   Oral   Take 25 mg by mouth daily as needed.         Marland Kitchen  HYDROcodone-acetaminophen (NORCO/VICODIN) 5-325 MG per tablet   Oral   Take 1-2 tablets by mouth every 4 (four) hours as needed.   100 tablet   1   . levothyroxine (SYNTHROID, LEVOTHROID) 100 MCG tablet   Oral   Take 50-100 mcg by mouth See admin instructions. Pt takes 1/2 tab for 50 mcg dose on Tuesday,thursday,saturday,sunday. 100 mcg  On Monday, Wednesday,friday         . metoprolol succinate (TOPROL-XL) 25 MG 24 hr tablet   Oral   Take 25 mg by mouth daily.         Marland Kitchen omeprazole (PRILOSEC) 20 MG capsule   Oral   Take 20 mg by mouth daily.         . sucralfate (CARAFATE) 1 G tablet   Oral   Take 1 tablet (1 g total) by mouth 2 (two) times daily.   60 tablet   5    BP 136/86  Pulse 108  Temp(Src) 97.8 F (36.6 C) (Oral)  Resp 17  SpO2 97% Physical Exam  Nursing note and vitals reviewed. Constitutional: No distress.  Thin    HENT:  Head: Normocephalic and atraumatic.  Right Ear: External ear normal.  Left Ear: External ear normal.  Eyes: Conjunctivae are normal. Right eye exhibits no discharge. Left eye exhibits no discharge. No scleral icterus.  Neck: Neck supple. No tracheal deviation present.  Cardiovascular: Normal rate, regular rhythm and intact distal pulses.   Pulmonary/Chest: Effort normal and breath sounds normal. No stridor. No respiratory distress. She has no wheezes. She has no rales.  Abdominal: Soft. Bowel sounds are normal. She exhibits no distension and no mass. There is tenderness in the epigastric area. There is guarding. There is no rebound.  Musculoskeletal: She exhibits no edema and no tenderness.  Neurological: She is alert. She has normal strength. No cranial nerve deficit (no facial droop, extraocular movements intact, no slurred speech) or sensory deficit. She exhibits normal muscle tone. She displays no seizure activity. Coordination normal.  Skin: Skin is warm and dry. No rash noted. She is not diaphoretic.  Psychiatric: She has a  normal mood and affect.    ED Course  Procedures (including critical care time) Labs Review Labs Reviewed  COMPREHENSIVE METABOLIC PANEL - Abnormal; Notable for the following:    Sodium 130 (*)    Chloride 89 (*)    Glucose, Bld 293 (*)    Calcium 10.7 (*)    AST 315 (*)  ALT 669 (*)    Alkaline Phosphatase 1143 (*)    Total Bilirubin 8.9 (*)    GFR calc non Af Amer 83 (*)    All other components within normal limits  LIPASE, BLOOD - Abnormal; Notable for the following:    Lipase 10 (*)    All other components within normal limits  CBC WITH DIFFERENTIAL   Imaging Review Ct Abdomen Pelvis W Contrast  10/03/2013   CLINICAL DATA:  Mid to lower abdominal pain  EXAM: CT ABDOMEN AND PELVIS WITH CONTRAST  TECHNIQUE: Multidetector CT imaging of the abdomen and pelvis was performed using the standard protocol following bolus administration of intravenous contrast.  CONTRAST:  173mL OMNIPAQUE IOHEXOL 300 MG/ML  SOLN  COMPARISON:  05/22/2013  FINDINGS: There is no pleural or pericardial effusion identified. There is diffuse intrahepatic bile duct dilatation. The common bile duct is markedly dilated measuring up to 2.5 cm, image 24/series 2. There is a mass within the head and uncinate process of the pancreas which measures 3.1 cm, image 25/ series 602. There is not only obstruction of the common bile duct, but the pancreatic duct is also obstructed and dilated. The mass appears to touch but does not encase the superior mesenteric artery, image 29/series 2. The mass also appears to abut the posterior aspect of the portal vein, image 27/series 2. The lumen of the portal vein and superior mesenteric arteries are not compromised. The celiac artery appears uninvolved the spleen is normal. The splenic vein is patent  The adrenal glands both appear normal. Normal appearance of both kidneys. The urinary bladder appears normal. A pessary device is identified within the dome of vagina. Next  Calcified  atherosclerotic disease involves the abdominal aorta. There is no aneurysm. No upper abdominal adenopathy. There is no pelvic or inguinal adenopathy identified.  The stomach is normal. The small bowel loops have a normal appearance without obstruction. The appendix is visualized and appears normal. Normal appearance of the colon.  Review of the visualized bony structures is significant for scoliosis and multi level degenerative disc disease. No aggressive lytic or sclerotic bone lesions.  IMPRESSION: 1. Mass within the head and uncinate process of the pancreas obstructs both the common bile duct and pancreatic duct. 2. No specific features identified to suggest metastatic disease.   Electronically Signed   By: Kerby Moors M.D.   On: 10/03/2013 13:49      MDM   Final diagnoses:  Pancreatic mass  Obstructive jaundice    Pt's ct scan  showed a mass at the pancreas.  PCP saw pt the other day and ordered the ct scan.  Pt was told to come to the ED to be admitted for further evaluation.  Pt had labs two days ago that showed elevated lfts.   Bili is increased today.  D/w Dr Benson Norway who will evaluate the patient.    Kathalene Frames, MD 10/03/13 [phone]

## 2013-10-03 NOTE — ED Notes (Signed)
Pt transported to floor at this time, NAD upon leaving dept.  

## 2013-10-04 ENCOUNTER — Inpatient Hospital Stay (HOSPITAL_COMMUNITY): Payer: Medicare Other

## 2013-10-04 ENCOUNTER — Encounter (HOSPITAL_COMMUNITY): Payer: Medicare Other | Admitting: Anesthesiology

## 2013-10-04 ENCOUNTER — Encounter (HOSPITAL_COMMUNITY)
Admission: EM | Disposition: A | Discharge status: Home / Self Care | Payer: Self-pay | Source: Home / Self Care | Attending: Internal Medicine

## 2013-10-04 ENCOUNTER — Inpatient Hospital Stay (HOSPITAL_COMMUNITY): Payer: Medicare Other | Admitting: Anesthesiology

## 2013-10-04 ENCOUNTER — Encounter (HOSPITAL_COMMUNITY): Payer: Self-pay | Admitting: *Deleted

## 2013-10-04 DIAGNOSIS — R7309 Other abnormal glucose: Secondary | ICD-10-CM

## 2013-10-04 DIAGNOSIS — E119 Type 2 diabetes mellitus without complications: Secondary | ICD-10-CM

## 2013-10-04 HISTORY — PX: ERCP: SHX5425

## 2013-10-04 LAB — COMPREHENSIVE METABOLIC PANEL
ALT: 546 U/L — ABNORMAL HIGH (ref 0–35)
AST: 341 U/L — ABNORMAL HIGH (ref 0–37)
Albumin: 2.9 g/dL — ABNORMAL LOW (ref 3.5–5.2)
Alkaline Phosphatase: 1019 U/L — ABNORMAL HIGH (ref 39–117)
BUN: 11 mg/dL (ref 6–23)
CALCIUM: 9.5 mg/dL (ref 8.4–10.5)
CO2: 25 meq/L (ref 19–32)
Chloride: 99 mEq/L (ref 96–112)
Creatinine, Ser: 0.58 mg/dL (ref 0.50–1.10)
GFR, EST NON AFRICAN AMERICAN: 83 mL/min — AB (ref >90)
Glucose, Bld: 201 mg/dL — ABNORMAL HIGH (ref 70–99)
Potassium: 3.9 mEq/L (ref 3.7–5.3)
SODIUM: 136 meq/L — AB (ref 137–147)
Total Bilirubin: 8.6 mg/dL — ABNORMAL HIGH (ref 0.3–1.2)
Total Protein: 6.2 g/dL (ref 6.0–8.3)

## 2013-10-04 LAB — GLUCOSE, CAPILLARY
GLUCOSE-CAPILLARY: 242 mg/dL — AB (ref 70–99)
Glucose-Capillary: 263 mg/dL — ABNORMAL HIGH (ref 70–99)
Glucose-Capillary: 276 mg/dL — ABNORMAL HIGH (ref 70–99)

## 2013-10-04 LAB — CANCER ANTIGEN 19-9: CA 19-9: 640.6 U/mL — ABNORMAL HIGH (ref <35.0)

## 2013-10-04 SURGERY — ERCP, WITH INTERVENTION IF INDICATED
Anesthesia: General

## 2013-10-04 SURGERY — ERCP, WITH INTERVENTION IF INDICATED
Anesthesia: Moderate Sedation

## 2013-10-04 MED ORDER — PROPOFOL 10 MG/ML IV BOLUS
INTRAVENOUS | Status: DC | PRN
  Administered 2013-10-04: 160 mg via INTRAVENOUS

## 2013-10-04 MED ORDER — LACTATED RINGERS IV SOLN: INTRAVENOUS | Status: DC

## 2013-10-04 MED ORDER — PROMETHAZINE HCL 25 MG/ML IJ SOLN: 6.2500 mg | INTRAMUSCULAR | Status: DC | PRN

## 2013-10-04 MED ORDER — PROPOFOL 10 MG/ML IV BOLUS
INTRAVENOUS | Status: AC
  Filled 2013-10-04: qty 20

## 2013-10-04 MED ORDER — SUCCINYLCHOLINE CHLORIDE 20 MG/ML IJ SOLN
INTRAMUSCULAR | Status: DC | PRN
  Administered 2013-10-04: 100 mg via INTRAVENOUS

## 2013-10-04 MED ORDER — SODIUM CHLORIDE 0.9 % IV SOLN: INTRAVENOUS | Status: DC

## 2013-10-04 MED ORDER — ONDANSETRON HCL 4 MG/2ML IJ SOLN
INTRAMUSCULAR | Status: DC | PRN
  Administered 2013-10-04: 4 mg via INTRAVENOUS

## 2013-10-04 MED ORDER — INSULIN ASPART 100 UNIT/ML ~~LOC~~ SOLN
0.0000 [IU] | Freq: Three times a day (TID) | SUBCUTANEOUS | Status: DC
  Administered 2013-10-04: 5 [IU] via SUBCUTANEOUS
  Administered 2013-10-04: 3 [IU] via SUBCUTANEOUS

## 2013-10-04 MED ORDER — FENTANYL CITRATE 0.05 MG/ML IJ SOLN
INTRAMUSCULAR | Status: DC | PRN
  Administered 2013-10-04: 50 ug via INTRAVENOUS
  Administered 2013-10-04 (×2): 25 ug via INTRAVENOUS

## 2013-10-04 MED ORDER — LIDOCAINE HCL (CARDIAC) 20 MG/ML IV SOLN
INTRAVENOUS | Status: DC | PRN
  Administered 2013-10-04: 50 mg via INTRAVENOUS

## 2013-10-04 MED ORDER — MORPHINE SULFATE 10 MG/ML IJ SOLN: 1.0000 mg | INTRAMUSCULAR | Status: DC | PRN

## 2013-10-04 MED ORDER — LIVING WELL WITH DIABETES BOOK
Freq: Once | Status: AC
  Administered 2013-10-04: 18:00:00
  Filled 2013-10-04: qty 1

## 2013-10-04 MED ORDER — SODIUM CHLORIDE 0.9 % IV SOLN
INTRAVENOUS | Status: DC | PRN
  Administered 2013-10-04: 12:00:00

## 2013-10-04 MED ORDER — GLUCAGON HCL (RDNA) 1 MG IJ SOLR
INTRAMUSCULAR | Status: DC | PRN
  Administered 2013-10-04: .05 mg via INTRAVENOUS

## 2013-10-04 MED ORDER — LACTATED RINGERS IV SOLN
INTRAVENOUS | Status: DC | PRN
  Administered 2013-10-04: 11:00:00 via INTRAVENOUS

## 2013-10-04 MED ORDER — GLUCAGON HCL (RDNA) 1 MG IJ SOLR
INTRAMUSCULAR | Status: AC
  Filled 2013-10-04: qty 1

## 2013-10-04 MED ORDER — ONDANSETRON HCL 4 MG/2ML IJ SOLN
INTRAMUSCULAR | Status: AC
  Filled 2013-10-04: qty 2

## 2013-10-04 MED ORDER — FENTANYL CITRATE 0.05 MG/ML IJ SOLN
INTRAMUSCULAR | Status: AC
  Filled 2013-10-04: qty 2

## 2013-10-04 MED ORDER — LIDOCAINE HCL (CARDIAC) 20 MG/ML IV SOLN
INTRAVENOUS | Status: AC
  Filled 2013-10-04: qty 5

## 2013-10-04 NOTE — Anesthesia Preprocedure Evaluation (Addendum)
Anesthesia Evaluation  Patient identified by MRN, date of birth, ID band Patient awake    Reviewed: Allergy & Precautions, H&P , NPO status , Patient's Chart, lab work & pertinent test results  History of Anesthesia Complications (+) PROLONGED EMERGENCE  Airway Mallampati: II TM Distance: >3 FB Neck ROM: Full    Dental  (+) Caps, Dental Advisory Given   Pulmonary former smoker,  breath sounds clear to auscultation  Pulmonary exam normal       Cardiovascular hypertension, Pt. on home beta blockers Rhythm:Regular Rate:Normal     Neuro/Psych negative neurological ROS  negative psych ROS   GI/Hepatic Neg liver ROS, GERD-  Medicated,Pancreatic CA with mass   Endo/Other  Hypothyroidism States borderline diabetes. Occasional hypoglycemia.  Renal/GU negative Renal ROS  negative genitourinary   Musculoskeletal negative musculoskeletal ROS (+)   Abdominal   Peds negative pediatric ROS (+)  Hematology negative hematology ROS (+)   Anesthesia Other Findings Prior right mastectomy Broken tooth with prior general anesthetic.  Reproductive/Obstetrics negative OB ROS                        Anesthesia Physical Anesthesia Plan  ASA: III and emergent  Anesthesia Plan: General   Post-op Pain Management:    Induction: Intravenous  Airway Management Planned: Oral ETT  Additional Equipment:   Intra-op Plan:   Post-operative Plan: Extubation in OR  Informed Consent: I have reviewed the patients History and Physical, chart, labs and discussed the procedure including the risks, benefits and alternatives for the proposed anesthesia with the patient or authorized representative who has indicated his/her understanding and acceptance.   Dental advisory given  Plan Discussed with: CRNA  Anesthesia Plan Comments:         Anesthesia Quick Evaluation

## 2013-10-04 NOTE — Progress Notes (Signed)
PROGRESS NOTE   [name] OEU:[id number] DOB: 1928-03-04 DOA: 10/03/2013 PCP: [name], MD  Brief narrative: [name] is an 80 y.o. female with a PMH of remote breast cancer, cholecystitis, cholelithiasis and gallstone pancreatitis, status post cholecystectomy 10/2011, who was admitted 10/03/13 with obstructive jaundice following outpatient evaluation with CT scan of the abdomen and pelvis showing a pancreatic mass.  GI was consulted with plans for ERCP.   Assessment/Plan: Principal Problem:  Obstructive jaundice and transaminitis secondary to pancreatic mass  CT scan findings worrisome for malignant obstruction. GI has been consulted with plans for ERCP today. Will need biopsies and possible stent placement. Continue IV fluids, empiric Cipro, and supportive care. LFTs trending down with IV fluids. Active Problems:  Diabetes mellitus, newly diagnosed, hyperglycemia Hemoglobin A1c 9.4. Start insulin sensitive sliding scale 3 times a day. Diabetes coordinator consultation requested. Hypothyroidism  Continue home dose of Synthroid. TSH 1.303 on 10/01/13. GERD (gastroesophageal reflux disease)  Continue IV Protonix and continue Carafate.  Tachycardia  Chronic. Continue beta blocker.  DVT prophylaxis  Continue Lovenox.  Code Status: Full.  Family Communication: Roxine Whittinghill (son) [phone] updated 10/03/13, [phone]. Joya Salm [phone] updated at bedside.  Disposition Plan: Home when stable.    IV access:  Peripheral IV  Medical Consultants:  Dr. Carol Ada, Gastroenterology  Other Consultants:  None  Anti-infectives:  Cipro 10/03/13--->  HPI/Subjective: Dotsie W Franklin denies abdominal pain or reflux type symptoms today. Says she has not eaten anything and that her reflux symptoms typically flareup with oral intake. No BM in several days but does not feel constipated.  Objective: Filed Vitals:   10/03/13 1450 10/03/13 1758  10/03/13 2027 10/04/13 0500  BP: 136/86 155/85 144/74 150/73  Pulse:  95 110 101  Temp:  98 F (36.7 C) 98.1 F (36.7 C) 98 F (36.7 C)  TempSrc:  Oral Oral Oral  Resp:  14 16 16   Height:   5' 2.5" (1.588 m)   Weight:   50.395 kg (111 lb 1.6 oz)   SpO2:  97% 99% 97%    Intake/Output Summary (Last 24 hours) at 10/04/13 0826 Last data filed at 10/04/13 0204  Gross per 24 hour  Intake      0 ml  Output    900 ml  Net   -900 ml    Exam: Gen:  NAD, jaundiced Cardiovascular:  RRR, No M/R/G Respiratory:  Lungs CTAB Gastrointestinal:  Abdomen soft, NT/ND, + BS Extremities:  No C/E/C  Data Reviewed: Basic Metabolic Panel:  Recent Labs Lab 10/01/13 1318 10/03/13 1605 10/04/13 0422  NA 127* 130* 136*  K 4.4 4.7 3.9  CL 91* 89* 99  CO2 24 26 25   GLUCOSE 349* 293* 201*  BUN 21 17 11   CREATININE 0.81 0.58 0.58  CALCIUM 10.5 10.7* 9.5   GFR Estimated Creatinine Clearance: 42.4 ml/min (by C-G formula based on Cr of 0.58). Liver Function Tests:  Recent Labs Lab 10/01/13 1318 10/03/13 1605 10/04/13 0422  AST 382* 315* 341*  ALT 827* 669* 546*  ALKPHOS 953* 1143* 1019*  BILITOT 5.9* 8.9* 8.6*  PROT 7.1 7.5 6.2  ALBUMIN 4.4 3.6 2.9*    Recent Labs Lab 10/01/13 1318 10/03/13 1605  LIPASE <10 10*  AMYLASE 34  --    CBC:  Recent Labs Lab 10/01/13 1318 10/03/13 1605  WBC 8.7 9.3  NEUTROABS 6.4 5.8  HGB 13.1 12.7  HCT 39.9 36.2  MCV 95.5 88.7  PLT 276 266  Hgb A1c  Recent Labs  10/01/13 1318  HGBA1C 9.4*   Lipid Profile  Recent Labs  10/01/13 1318  CHOL 260*  HDL 56  LDLCALC 171*  TRIG 164*  CHOLHDL 4.6   Thyroid function studies  Recent Labs  10/01/13 1318  TSH 1.303   Anemia work up  Recent Labs  10/01/13 1318  TIBC 389  IRON 109   Microbiology Recent Results (from the past 240 hour(s))  URINE CULTURE     Status: None   Collection Time    10/01/13  1:18 PM      Result Value Ref Range Status   Colony Count NO GROWTH    Final   Organism ID, Bacteria NO GROWTH   Final     Procedures and Diagnostic Studies: Dg Chest 2 View  10/01/2013   CLINICAL DATA:  Abdominal discomfort, weakness  EXAM: CHEST  2 VIEW  COMPARISON:  05/22/2012  FINDINGS: Cardiomediastinal silhouette is stable. Again noted status post right mastectomy. Degenerative changes and levoscoliosis lumbar spine. Stable degenerative changes lower thoracic spine. Mild hyperinflation again noted. No acute infiltrate or pleural effusion. No pulmonary edema.  IMPRESSION: No active disease. Status post right mastectomy. Lumbar levoscoliosis.   Electronically Signed   By: Lahoma Crocker M.D.   On: 10/01/2013 15:12   Ct Abdomen Pelvis W Contrast  10/03/2013   CLINICAL DATA:  Mid to lower abdominal pain  EXAM: CT ABDOMEN AND PELVIS WITH CONTRAST  TECHNIQUE: Multidetector CT imaging of the abdomen and pelvis was performed using the standard protocol following bolus administration of intravenous contrast.  CONTRAST:  147mL OMNIPAQUE IOHEXOL 300 MG/ML  SOLN  COMPARISON:  05/22/2013  FINDINGS: There is no pleural or pericardial effusion identified. There is diffuse intrahepatic bile duct dilatation. The common bile duct is markedly dilated measuring up to 2.5 cm, image 24/series 2. There is a mass within the head and uncinate process of the pancreas which measures 3.1 cm, image 25/ series 602. There is not only obstruction of the common bile duct, but the pancreatic duct is also obstructed and dilated. The mass appears to touch but does not encase the superior mesenteric artery, image 29/series 2. The mass also appears to abut the posterior aspect of the portal vein, image 27/series 2. The lumen of the portal vein and superior mesenteric arteries are not compromised. The celiac artery appears uninvolved the spleen is normal. The splenic vein is patent  The adrenal glands both appear normal. Normal appearance of both kidneys. The urinary bladder appears normal. A pessary device is  identified within the dome of vagina. Next  Calcified atherosclerotic disease involves the abdominal aorta. There is no aneurysm. No upper abdominal adenopathy. There is no pelvic or inguinal adenopathy identified.  The stomach is normal. The small bowel loops have a normal appearance without obstruction. The appendix is visualized and appears normal. Normal appearance of the colon.  Review of the visualized bony structures is significant for scoliosis and multi level degenerative disc disease. No aggressive lytic or sclerotic bone lesions.  IMPRESSION: 1. Mass within the head and uncinate process of the pancreas obstructs both the common bile duct and pancreatic duct. 2. No specific features identified to suggest metastatic disease.   Electronically Signed   By: Kerby Moors M.D.   On: 10/03/2013 13:49   Dg Abd 2 Views  10/01/2013   CLINICAL DATA:  Abdominal pain  EXAM: ABDOMEN - 2 VIEW  COMPARISON:  CT ABD/PELV WO CM dated 05/22/2013; DG LUMBAR  SPINE 1 VIEW dated 05/27/2012  FINDINGS: There is a paucity of bowel gas. A moderate to large amount of stool is appreciated. Severe levoscoliosis appreciated within the lumbar spine. Atherosclerotic calcifications identified within the aorta.  IMPRESSION: Nonobstructive bowel gas pattern with large amount of stool.   Electronically Signed   By: Margaree Mackintosh M.D.   On: 10/01/2013 15:21    Scheduled Meds: . ciprofloxacin  400 mg Intravenous Q12H  . levothyroxine  50 mcg Oral Once per day on Sun Tue Thu Sat   And  . [START ON 10/06/2013] levothyroxine  100 mcg Oral Once per day on Mon Wed Fri  . metoprolol succinate  25 mg Oral Daily  . ondansetron  4 mg Intravenous Once  . pantoprazole (PROTONIX) IV  40 mg Intravenous Q24H  . sucralfate  1 g Oral BID   Continuous Infusions: . sodium chloride 100 mL/hr at 10/04/13 0344  . sodium chloride      Time spent: 35 minutes with > 50% of time discussing current diagnostic test results, clinical impression and  plan of care.    LOS: 1 day   Cordia Miklos  [facility] Pager [phone]. If unable to reach me by pager, please call my cell phone at [phone].  *Please note that the hospitalists switch teams on Wednesdays. Please call the flow manager at [phone] if you are having difficulty reaching the hospitalist taking care of this patient as she can update you and provide the most up-to-date pager number of provider caring for the patient. If 8PM-8AM, please contact night-coverage at [email or web address], password [facility]  10/04/2013, 8:26 AM    **Disclaimer: This note was dictated with voice recognition software. Similar sounding words can inadvertently be transcribed and this note may contain transcription errors which may not have been corrected upon publication of note.**      In an effort to keep you and your family informed about your hospital stay, I am providing you with this information sheet. If you or your family have any questions, please do not hesitate to have the nursing staff page me to set up a meeting time.  Rumor W Farnan 10/04/2013 1 (Number of days in the hospital)  Treatment team:  Dr. Jacquelynn Cree, Hospitalist (Internist)  Dr. Carol Ada, Gastroenterology  Active Treatment Issues with Plan: Principal Problem:  Jaundice from blockage of the pancreatic duct by a pancreatic mass  CT scan done 10/03/13 showed a pancreatic mass. Dr. Benson Norway will perform an ERCP today. Your liver function tests are slightly lower today. Active Problems:  Newly diagnosed diabetes Hemoglobin A1c 9.4. This is a test that measures your average glucose over a three-month period of time. Diabetes is diagnosed when this number is greater than 6.5. A hemoglobin A1c of 9.4 corresponds to an average blood sugar of 223. I suspect this is a result of pancreatic dysfunction. Your pancreas makes insulin. We have started you on sliding scale insulin to address high blood sugars. We also will have the  diabetes educator come and talk with you about diabetes and how this is managed. Under active thyroid  Continue home dose of Synthroid. TSH 1.303 (within normal limits) on 10/01/13. Reflux We have you on Protonix for acid suppression and Carafate to coat your stomach.  Fast heart rate  Chronic. Continue metoprolol.  Blood clot prevention  Continue Lovenox.  Anticipated discharge date: 1-2 days depending on how quickly your liver function tests start coming down.

## 2013-10-04 NOTE — Progress Notes (Signed)
Patient resting prone; positioned with extremities and pressure points padded; chest rolls in place and head and neck cradled in place

## 2013-10-04 NOTE — Transfer of Care (Signed)
Immediate Anesthesia Transfer of Care Note  Patient: [name]  Procedure(s) Performed: Procedure(s): ENDOSCOPIC RETROGRADE CHOLANGIOPANCREATOGRAPHY (ERCP) (N/A)  Patient Location: PACU  Anesthesia Type:General  Level of Consciousness: awake, alert  and oriented  Airway & Oxygen Therapy: Patient Spontanous Breathing and Patient connected to face mask oxygen  Post-op Assessment: Report given to PACU RN and Post -op Vital signs reviewed and stable  Post vital signs: Reviewed and stable  Complications: No apparent anesthesia complications

## 2013-10-04 NOTE — Progress Notes (Addendum)
Inpatient Diabetes Program Recommendations  AACE/ADA: New Consensus Statement on Inpatient Glycemic Control (2013)  Target Ranges:  Prepandial:   less than 140 mg/dL      Peak postprandial:   less than 180 mg/dL (1-2 hours)      Critically ill patients:  140 - 180 mg/dL   Reason for Assessment: Received referral for this patient.  New diagnosis of DM  PCP: Dr. Unk Pinto  Diabetes history: New diagnosis of Type 2 DM Current orders for Inpatient glycemic control: Novolog Sensitive SSI tid   Results for [name], [name] (MRN [id number]) as of 10/04/2013 15:07  Ref. Range 10/01/2013 13:18  Hemoglobin A1C Latest Range: <5.7 % 9.4 (H)    Patient admitted with abdominal pain.  CT showed pancreatic mass.  Patient s/p ERCP with stent insertion this morning.  A1c of 9.4% shows new diagnosis of DM.  Note that Novolog Sensitive SSI started today at lunch.  Will order educational materials for patient.  Will ask RNs to begin basic DM education with patient at bedside.  Will order RD consult for DM diet education.    Will place order for Outpatient DM education for patient at the [facility] and DM [facility] after d/c.  Since patient admitted with elevated LFTs, Metformin may not be a feasible option for patient at d/c.  We could try a DPP-4 inhibitor like Tradgenta 5 mg daily at d/c.  Patient will need to follow up with her PCP at d/c for further DM management.  MD, please also make sure to give patient a Rx for CBG meter at d/c.  MD- Please also change current diet to Carbohydrate Modified diet (currently ordered as Regular diet)   Will follow. Wyn Quaker RN, MSN, CDE Diabetes Coordinator Inpatient Diabetes Program Team Pager: [phone] (8a-10p)

## 2013-10-04 NOTE — H&P (View-Only) (Signed)
PROGRESS NOTE   [name]:[id number] DOB: 1928-03-04 DOA: 10/03/2013 PCP: [name], MD  Brief narrative: [place] HUBERTY is an 80 y.o. female with a PMH of remote breast cancer, cholecystitis, cholelithiasis and gallstone pancreatitis, status post cholecystectomy 10/2011, who was admitted 10/03/13 with obstructive jaundice following outpatient evaluation with CT scan of the abdomen and pelvis showing a pancreatic mass.  GI was consulted with plans for ERCP.   Assessment/Plan: Principal Problem:  Obstructive jaundice and transaminitis secondary to pancreatic mass  CT scan findings worrisome for malignant obstruction. GI has been consulted with plans for ERCP today. Will need biopsies and possible stent placement. Continue IV fluids, empiric Cipro, and supportive care. LFTs trending down with IV fluids. Active Problems:  Diabetes mellitus, newly diagnosed, hyperglycemia Hemoglobin A1c 9.4. Start insulin sensitive sliding scale 3 times a day. Diabetes coordinator consultation requested. Hypothyroidism  Continue home dose of Synthroid. TSH 1.303 on 10/01/13. GERD (gastroesophageal reflux disease)  Continue IV Protonix and continue Carafate.  Tachycardia  Chronic. Continue beta blocker.  DVT prophylaxis  Continue Lovenox.  Code Status: Full.  Family Communication: [name] (son) [phone] updated 10/03/13, [phone]. [name] [phone] updated at bedside.  Disposition Plan: Home when stable.    IV access:  Peripheral IV  Medical Consultants:  Dr. Carol Ada, Gastroenterology  Other Consultants:  None  Anti-infectives:  Cipro 10/03/13--->  HPI/Subjective: [name] denies abdominal pain or reflux type symptoms today. Says she has not eaten anything and that her reflux symptoms typically flareup with oral intake. No BM in several days but does not feel constipated.  Objective: Filed Vitals:   10/03/13 1450 10/03/13 1758  10/03/13 2027 10/04/13 0500  BP: 136/86 155/85 144/74 150/73  Pulse:  95 110 101  Temp:  98 F (36.7 C) 98.1 F (36.7 C) 98 F (36.7 C)  TempSrc:  Oral Oral Oral  Resp:  14 16 16   Height:   5' 2.5" (1.588 m)   Weight:   50.395 kg (111 lb 1.6 oz)   SpO2:  97% 99% 97%    Intake/Output Summary (Last 24 hours) at 10/04/13 0826 Last data filed at 10/04/13 0204  Gross per 24 hour  Intake      0 ml  Output    900 ml  Net   -900 ml    Exam: Gen:  NAD, jaundiced Cardiovascular:  RRR, No M/R/G Respiratory:  Lungs CTAB Gastrointestinal:  Abdomen soft, NT/ND, + BS Extremities:  No C/E/C  Data Reviewed: Basic Metabolic Panel:  Recent Labs Lab 10/01/13 1318 10/03/13 1605 10/04/13 0422  NA 127* 130* 136*  K 4.4 4.7 3.9  CL 91* 89* 99  CO2 24 26 25   GLUCOSE 349* 293* 201*  BUN 21 17 11   CREATININE 0.81 0.58 0.58  CALCIUM 10.5 10.7* 9.5   GFR Estimated Creatinine Clearance: 42.4 ml/min (by C-G formula based on Cr of 0.58). Liver Function Tests:  Recent Labs Lab 10/01/13 1318 10/03/13 1605 10/04/13 0422  AST 382* 315* 341*  ALT 827* 669* 546*  ALKPHOS 953* 1143* 1019*  BILITOT 5.9* 8.9* 8.6*  PROT 7.1 7.5 6.2  ALBUMIN 4.4 3.6 2.9*    Recent Labs Lab 10/01/13 1318 10/03/13 1605  LIPASE <10 10*  AMYLASE 34  --    CBC:  Recent Labs Lab 10/01/13 1318 10/03/13 1605  WBC 8.7 9.3  NEUTROABS 6.4 5.8  HGB 13.1 12.7  HCT 39.9 36.2  MCV 95.5 88.7  PLT 276 266  Hgb A1c  Recent Labs  10/01/13 1318  HGBA1C 9.4*   Lipid Profile  Recent Labs  10/01/13 1318  CHOL 260*  HDL 56  LDLCALC 171*  TRIG 164*  CHOLHDL 4.6   Thyroid function studies  Recent Labs  10/01/13 1318  TSH 1.303   Anemia work up  Recent Labs  10/01/13 1318  TIBC 389  IRON 109   Microbiology Recent Results (from the past 240 hour(s))  URINE CULTURE     Status: None   Collection Time    10/01/13  1:18 PM      Result Value Ref Range Status   Colony Count NO GROWTH    Final   Organism ID, Bacteria NO GROWTH   Final     Procedures and Diagnostic Studies: Dg Chest 2 View  10/01/2013   CLINICAL DATA:  Abdominal discomfort, weakness  EXAM: CHEST  2 VIEW  COMPARISON:  05/22/2012  FINDINGS: Cardiomediastinal silhouette is stable. Again noted status post right mastectomy. Degenerative changes and levoscoliosis lumbar spine. Stable degenerative changes lower thoracic spine. Mild hyperinflation again noted. No acute infiltrate or pleural effusion. No pulmonary edema.  IMPRESSION: No active disease. Status post right mastectomy. Lumbar levoscoliosis.   Electronically Signed   By: Lahoma Crocker M.D.   On: 10/01/2013 15:12   Ct Abdomen Pelvis W Contrast  10/03/2013   CLINICAL DATA:  Mid to lower abdominal pain  EXAM: CT ABDOMEN AND PELVIS WITH CONTRAST  TECHNIQUE: Multidetector CT imaging of the abdomen and pelvis was performed using the standard protocol following bolus administration of intravenous contrast.  CONTRAST:  147mL OMNIPAQUE IOHEXOL 300 MG/ML  SOLN  COMPARISON:  05/22/2013  FINDINGS: There is no pleural or pericardial effusion identified. There is diffuse intrahepatic bile duct dilatation. The common bile duct is markedly dilated measuring up to 2.5 cm, image 24/series 2. There is a mass within the head and uncinate process of the pancreas which measures 3.1 cm, image 25/ series 602. There is not only obstruction of the common bile duct, but the pancreatic duct is also obstructed and dilated. The mass appears to touch but does not encase the superior mesenteric artery, image 29/series 2. The mass also appears to abut the posterior aspect of the portal vein, image 27/series 2. The lumen of the portal vein and superior mesenteric arteries are not compromised. The celiac artery appears uninvolved the spleen is normal. The splenic vein is patent  The adrenal glands both appear normal. Normal appearance of both kidneys. The urinary bladder appears normal. A pessary device is  identified within the dome of vagina. Next  Calcified atherosclerotic disease involves the abdominal aorta. There is no aneurysm. No upper abdominal adenopathy. There is no pelvic or inguinal adenopathy identified.  The stomach is normal. The small bowel loops have a normal appearance without obstruction. The appendix is visualized and appears normal. Normal appearance of the colon.  Review of the visualized bony structures is significant for scoliosis and multi level degenerative disc disease. No aggressive lytic or sclerotic bone lesions.  IMPRESSION: 1. Mass within the head and uncinate process of the pancreas obstructs both the common bile duct and pancreatic duct. 2. No specific features identified to suggest metastatic disease.   Electronically Signed   By: Kerby Moors M.D.   On: 10/03/2013 13:49   Dg Abd 2 Views  10/01/2013   CLINICAL DATA:  Abdominal pain  EXAM: ABDOMEN - 2 VIEW  COMPARISON:  CT ABD/PELV WO CM dated 05/22/2013; DG LUMBAR  SPINE 1 VIEW dated 05/27/2012  FINDINGS: There is a paucity of bowel gas. A moderate to large amount of stool is appreciated. Severe levoscoliosis appreciated within the lumbar spine. Atherosclerotic calcifications identified within the aorta.  IMPRESSION: Nonobstructive bowel gas pattern with large amount of stool.   Electronically Signed   By: Margaree Mackintosh M.D.   On: 10/01/2013 15:21    Scheduled Meds: . ciprofloxacin  400 mg Intravenous Q12H  . levothyroxine  50 mcg Oral Once per day on Sun Tue Thu Sat   And  . [START ON 10/06/2013] levothyroxine  100 mcg Oral Once per day on Mon Wed Fri  . metoprolol succinate  25 mg Oral Daily  . ondansetron  4 mg Intravenous Once  . pantoprazole (PROTONIX) IV  40 mg Intravenous Q24H  . sucralfate  1 g Oral BID   Continuous Infusions: . sodium chloride 100 mL/hr at 10/04/13 0344  . sodium chloride      Time spent: 35 minutes with > 50% of time discussing current diagnostic test results, clinical impression and  plan of care.    LOS: 1 day   [name]  [facility] Pager [phone]. If unable to reach me by pager, please call my cell phone at [phone].  *Please note that the hospitalists switch teams on Wednesdays. Please call the flow manager at [phone] if you are having difficulty reaching the hospitalist taking care of this patient as she can update you and provide the most up-to-date pager number of provider caring for the patient. If 8PM-8AM, please contact night-coverage at [email or web address], password [facility]  10/04/2013, 8:26 AM    **Disclaimer: This note was dictated with voice recognition software. Similar sounding words can inadvertently be transcribed and this note may contain transcription errors which may not have been corrected upon publication of note.**      In an effort to keep you and your family informed about your hospital stay, I am providing you with this information sheet. If you or your family have any questions, please do not hesitate to have the nursing staff page me to set up a meeting time.  [name] 10/04/2013 1 (Number of days in the hospital)  Treatment team:  Dr. Jacquelynn Cree, Hospitalist (Internist)  Dr. Carol Ada, Gastroenterology  Active Treatment Issues with Plan: Principal Problem:  Jaundice from blockage of the pancreatic duct by a pancreatic mass  CT scan done 10/03/13 showed a pancreatic mass. Dr. Benson Norway will perform an ERCP today. Your liver function tests are slightly lower today. Active Problems:  Newly diagnosed diabetes Hemoglobin A1c 9.4. This is a test that measures your average glucose over a three-month period of time. Diabetes is diagnosed when this number is greater than 6.5. A hemoglobin A1c of 9.4 corresponds to an average blood sugar of 223. I suspect this is a result of pancreatic dysfunction. Your pancreas makes insulin. We have started you on sliding scale insulin to address high blood sugars. We also will have the  diabetes educator come and talk with you about diabetes and how this is managed. Under active thyroid  Continue home dose of Synthroid. TSH 1.303 (within normal limits) on 10/01/13. Reflux We have you on Protonix for acid suppression and Carafate to coat your stomach.  Fast heart rate  Chronic. Continue metoprolol.  Blood clot prevention  Continue Lovenox.  Anticipated discharge date: 1-2 days depending on how quickly your liver function tests start coming down.

## 2013-10-04 NOTE — Op Note (Signed)
[place], [id number]   ERCP PROCEDURE REPORT  PATIENT: [name], [name].  MR# :[id number] BIRTHDATE: Jun 07, 1928  GENDER: Female ENDOSCOPIST: Carol Ada, MD REFERRED BY: [facility] PROCEDURE DATE:  10/04/2013 PROCEDURE:   ERCP with stent insertion ASA CLASS:   Class II INDICATIONS: Obstructive jaundice MEDICATIONS: General anesthesia TOPICAL ANESTHETIC: None  DESCRIPTION OF PROCEDURE:   After the risks benefits and alternatives of the procedure were thoroughly explained, informed consent was obtained.  The duodenoscope  was introduced through the mouth  and advanced to the second portion of the duodenum.  The ampulla was was identified and there was some distortion of the ampulla.  I believe a periampullar diverticulum was also noted in the superior aspect, but I was not able to obtain a clear visualization.  Cannulation of the CBD was easily achieved during the initial attempt.  The guidwire was secured in the right intrahepatic ducts.  Contrast injection was performed and marked dilation of the proximal to mid CBD was noted.  Contrast filling of the CBD was difficult as a result of the dilation.  The CBD was estilmated to be 15 mm.  In the distal portion there was a cut off sign.  Fluroscopic imaing was poor and I was not able to discern the exact length of the stenosis.  It was estimated to be 7 cm in length.  Brushings of this area were performed and then an 8.f Fr x 7 cm stent was easily inserted.  Post insertion imaging revealed that it was longer than required, i.e., the length of the stenosis was overestimated.  Excellent placement and subsequent drainage was achieved.  No evidence of cholangitis.   The scope was then completely withdrawn from the patient and the procedure terminated.     COMPLICATIONS:  ENDOSCOPIC IMPRESSION: 1) Malignant CBD obstruction.  RECOMMENDATIONS: 1) Await brushings. 2) Follow liver  panel. 3) Schedule for an EUS in the near future.    _______________________________ Lorrin MaisCarol Ada, MD 10/04/2013 12:01 PM   CC:

## 2013-10-04 NOTE — Interval H&P Note (Signed)
History and Physical Interval Note:  10/04/2013 11:02 AM  [name]  has presented today for surgery, with the diagnosis of pancreatic cancer with mass  The various methods of treatment have been discussed with the patient and family. After consideration of risks, benefits and other options for treatment, the patient has consented to  Procedure(s): ENDOSCOPIC RETROGRADE CHOLANGIOPANCREATOGRAPHY (ERCP) (N/A) as a surgical intervention .  The patient's history has been reviewed, patient examined, no change in status, stable for surgery.  I have reviewed the patient's chart and labs.  Questions were answered to the patient's satisfaction.     [name] D

## 2013-10-05 LAB — COMPREHENSIVE METABOLIC PANEL
ALT: 384 U/L — ABNORMAL HIGH (ref 0–35)
AST: 123 U/L — ABNORMAL HIGH (ref 0–37)
Albumin: 2.6 g/dL — ABNORMAL LOW (ref 3.5–5.2)
Alkaline Phosphatase: 830 U/L — ABNORMAL HIGH (ref 39–117)
BUN: 12 mg/dL (ref 6–23)
CALCIUM: 8.9 mg/dL (ref 8.4–10.5)
CO2: 25 mEq/L (ref 19–32)
Chloride: 98 mEq/L (ref 96–112)
Creatinine, Ser: 0.73 mg/dL (ref 0.50–1.10)
GFR calc non Af Amer: 77 mL/min — ABNORMAL LOW (ref >90)
GFR, EST AFRICAN AMERICAN: 89 mL/min — AB (ref >90)
GLUCOSE: 260 mg/dL — AB (ref 70–99)
Potassium: 4 mEq/L (ref 3.7–5.3)
SODIUM: 135 meq/L — AB (ref 137–147)
Total Bilirubin: 3 mg/dL — ABNORMAL HIGH (ref 0.3–1.2)
Total Protein: 5.5 g/dL — ABNORMAL LOW (ref 6.0–8.3)

## 2013-10-05 LAB — GLUCOSE, CAPILLARY: GLUCOSE-CAPILLARY: 244 mg/dL — AB (ref 70–99)

## 2013-10-05 MED ORDER — INSULIN ASPART 100 UNIT/ML ~~LOC~~ SOLN
4.0000 [IU] | Freq: Three times a day (TID) | SUBCUTANEOUS | Status: DC
  Administered 2013-10-05: 4 [IU] via SUBCUTANEOUS

## 2013-10-05 MED ORDER — LINAGLIPTIN 5 MG PO TABS
5.0000 mg | ORAL_TABLET | Freq: Every day | ORAL | Status: DC
  Administered 2013-10-05: 5 mg via ORAL
  Filled 2013-10-05: qty 1

## 2013-10-05 MED ORDER — INSULIN ASPART 100 UNIT/ML ~~LOC~~ SOLN
0.0000 [IU] | Freq: Three times a day (TID) | SUBCUTANEOUS | Status: DC
  Administered 2013-10-05: 5 [IU] via SUBCUTANEOUS

## 2013-10-05 MED ORDER — INSULIN ASPART 100 UNIT/ML ~~LOC~~ SOLN: 0.0000 [IU] | Freq: Every day | SUBCUTANEOUS | Status: DC

## 2013-10-05 MED ORDER — LINAGLIPTIN 5 MG PO TABS: 5.0000 mg | ORAL_TABLET | Freq: Every day | ORAL | Status: DC

## 2013-10-05 MED ORDER — FREESTYLE SYSTEM KIT: 1.0000 | PACK | Status: DC | PRN

## 2013-10-05 MED ORDER — PANTOPRAZOLE SODIUM 40 MG PO TBEC
40.0000 mg | DELAYED_RELEASE_TABLET | Freq: Every day | ORAL | Status: DC
  Administered 2013-10-05: 40 mg via ORAL
  Filled 2013-10-05: qty 1

## 2013-10-05 NOTE — Progress Notes (Signed)
Subjective: Feeling much better.  Objective: Vital signs in last 24 hours: Temp:  [97.8 F (36.6 C)-98.2 F (36.8 C)] 98.2 F (36.8 C) (03/01 0500) Pulse Rate:  [75-88] 84 (03/01 0500) Resp:  [10-20] 20 (03/01 0500) BP: (126-142)/(57-67) 126/63 mmHg (03/01 0500) SpO2:  [95 %-100 %] 96 % (03/01 0500) Last BM Date:  (pta)  Intake/Output from previous day: 02/28 0701 - 03/01 0700 In: 3703.3 [P.O.:840; I.V.:2863.3] Out: 2800 [Urine:2800] Intake/Output this shift:    General appearance: alert and no distress GI: soft, non-tender; bowel sounds normal; no masses,  no organomegaly  Lab Results:  Recent Labs  10/03/13 1605  WBC 9.3  HGB 12.7  HCT 36.2  PLT 266   BMET  Recent Labs  10/03/13 1605 10/04/13 0422 10/05/13 0423  NA 130* 136* 135*  K 4.7 3.9 4.0  CL 89* 99 98  CO2 26 25 25   GLUCOSE 293* 201* 260*  BUN 17 11 12   CREATININE 0.58 0.58 0.73  CALCIUM 10.7* 9.5 8.9   LFT  Recent Labs  10/05/13 0423  PROT 5.5*  ALBUMIN 2.6*  AST 123*  ALT 384*  ALKPHOS 830*  BILITOT 3.0*   PT/INR No results found for this basename: LABPROT, INR,  in the last 72 hours Hepatitis Panel No results found for this basename: HEPBSAG, HCVAB, HEPAIGM, HEPBIGM,  in the last 72 hours C-Diff No results found for this basename: CDIFFTOX,  in the last 72 hours Fecal Lactopherrin No results found for this basename: FECLLACTOFRN,  in the last 72 hours  Studies/Results: Ct Abdomen Pelvis W Contrast  10/03/2013   CLINICAL DATA:  Mid to lower abdominal pain  EXAM: CT ABDOMEN AND PELVIS WITH CONTRAST  TECHNIQUE: Multidetector CT imaging of the abdomen and pelvis was performed using the standard protocol following bolus administration of intravenous contrast.  CONTRAST:  150mL OMNIPAQUE IOHEXOL 300 MG/ML  SOLN  COMPARISON:  05/22/2013  FINDINGS: There is no pleural or pericardial effusion identified. There is diffuse intrahepatic bile duct dilatation. The common bile duct is markedly  dilated measuring up to 2.5 cm, image 24/series 2. There is a mass within the head and uncinate process of the pancreas which measures 3.1 cm, image 25/ series 602. There is not only obstruction of the common bile duct, but the pancreatic duct is also obstructed and dilated. The mass appears to touch but does not encase the superior mesenteric artery, image 29/series 2. The mass also appears to abut the posterior aspect of the portal vein, image 27/series 2. The lumen of the portal vein and superior mesenteric arteries are not compromised. The celiac artery appears uninvolved the spleen is normal. The splenic vein is patent  The adrenal glands both appear normal. Normal appearance of both kidneys. The urinary bladder appears normal. A pessary device is identified within the dome of vagina. Next  Calcified atherosclerotic disease involves the abdominal aorta. There is no aneurysm. No upper abdominal adenopathy. There is no pelvic or inguinal adenopathy identified.  The stomach is normal. The small bowel loops have a normal appearance without obstruction. The appendix is visualized and appears normal. Normal appearance of the colon.  Review of the visualized bony structures is significant for scoliosis and multi level degenerative disc disease. No aggressive lytic or sclerotic bone lesions.  IMPRESSION: 1. Mass within the head and uncinate process of the pancreas obstructs both the common bile duct and pancreatic duct. 2. No specific features identified to suggest metastatic disease.   Electronically Signed  By: Taylor  Stroud M.D.   On: 10/03/2013 13:49   Dg Ercp  10/04/2013   CLINICAL DATA:  Obstructive jaundice, ERCP with stent placement  EXAM: ERCP  TECHNIQUE: Multiple spot images obtained with the fluoroscopic device and submitted for interpretation post-procedure.  COMPARISON:  CT ABD/PELVIS W CM dated 10/03/2013; DG ABD 2 VIEWS dated 10/01/2013; CT ABD/PELV WO CM dated 05/22/2013  FINDINGS: Two spot  intraoperative fluoroscopic images during ERCP are provided for review.  Initial image demonstrates an ERCP probe overlying the right upper quadrant with selective cannulation of the common bile duct. There is minimal opacification of the intrahepatic biliary system which appears moderate to severely dilated centrally as demonstrated on recently performed abdominal CT.  Subsequent image demonstrates deployment of a plastic internal biliary stent overlying the distal aspect of the common bile duct.  Post cholecystectomy.  Enteric contrast is seen within the colon.  IMPRESSION: ERCP with plastic biliary stent placement as above.  These images were submitted for radiologic interpretation only. Please see the procedural report for the amount of contrast and the fluoroscopy time utilized.   Electronically Signed   By: John  Watts M.D.   On: 10/04/2013 11:59    Medications:  Scheduled: . ciprofloxacin  400 mg Intravenous Q12H  . insulin aspart  0-15 Units Subcutaneous TID WC  . insulin aspart  0-5 Units Subcutaneous QHS  . insulin aspart  4 Units Subcutaneous TID WC  . levothyroxine  50 mcg Oral Once per day on Sun Tue Thu Sat   And  . [START ON 10/06/2013] levothyroxine  100 mcg Oral Once per day on Mon Wed Fri  . linagliptin  5 mg Oral Daily  . metoprolol succinate  25 mg Oral Daily  . ondansetron  4 mg Intravenous Once  . pantoprazole  40 mg Oral Q0600  . sucralfate  1 g Oral BID   Continuous: . sodium chloride 100 mL/hr at 10/05/13 0247    Assessment/Plan: 1) Pancreatic head mass. 2) Obstructive jaundice.   The patient is clinically improved.  Her liver enzymes have declined as well as her bilirubin.  Plan: 1) Okay for D/C home. 2) I will schedule her for an EUS this coming Tuesday versus Friday. 3) Signing off.   LOS: 2 days   Lilyian Quayle D 10/05/2013, 8:20 AM  

## 2013-10-05 NOTE — Discharge Instructions (Signed)
Diabetes Meal Planning Guide The diabetes meal planning guide is a tool to help you plan your meals and snacks. It is important for people with diabetes to manage their blood glucose (sugar) levels. Choosing the right foods and the right amounts throughout your day will help control your blood glucose. Eating right can even help you improve your blood pressure and reach or maintain a healthy weight. CARBOHYDRATE COUNTING MADE EASY When you eat carbohydrates, they turn to sugar. This raises your blood glucose level. Counting carbohydrates can help you control this level so you feel better. When you plan your meals by counting carbohydrates, you can have more flexibility in what you eat and balance your medicine with your food intake. Carbohydrate counting simply means adding up the total amount of carbohydrate grams in your meals and snacks. Try to eat about the same amount at each meal. Foods with carbohydrates are listed below. Each portion below is 1 carbohydrate serving or 15 grams of carbohydrates. Ask your dietician how many grams of carbohydrates you should eat at each meal or snack. Grains and Starches  1 slice bread.   English muffin or hotdog/hamburger bun.   cup cold cereal (unsweetened).   cup cooked pasta or rice.   cup starchy vegetables (corn, potatoes, peas, beans, winter squash).  1 tortilla (6 inches).   bagel.  1 waffle or pancake (size of a CD).   cup cooked cereal.  4 to 6 small crackers. *Whole grain is recommended. Fruit  1 cup fresh unsweetened berries, melon, papaya, pineapple.  1 small fresh fruit.   banana or mango.   cup fruit juice (4 oz unsweetened).   cup canned fruit in natural juice or water.  2 tbs dried fruit.  12 to 15 grapes or cherries. Milk and Yogurt  1 cup fat-free or 1% milk.  1 cup soy milk.  6 oz light yogurt with sugar-free sweetener.  6 oz low-fat soy yogurt.  6 oz plain yogurt. Vegetables  1 cup raw or  cup  cooked is counted as 0 carbohydrates or a "free" food.  If you eat 3 or more servings at 1 meal, count them as 1 carbohydrate serving. Other Carbohydrates   oz chips or pretzels.   cup ice cream or frozen yogurt.   cup sherbet or sorbet.  2 inch square cake, no frosting.  1 tbs honey, sugar, jam, jelly, or syrup.  2 small cookies.  3 squares of graham crackers.  3 cups popcorn.  6 crackers.  1 cup broth-based soup.  Count 1 cup casserole or other mixed foods as 2 carbohydrate servings.  Foods with less than 20 calories in a serving may be counted as 0 carbohydrates or a "free" food. You may want to purchase a book or computer software that lists the carbohydrate gram counts of different foods. In addition, the nutrition facts panel on the labels of the foods you eat are a good source of this information. The label will tell you how big the serving size is and the total number of carbohydrate grams you will be eating per serving. Divide this number by 15 to obtain the number of carbohydrate servings in a portion. Remember, 1 carbohydrate serving equals 15 grams of carbohydrate. SERVING SIZES Measuring foods and serving sizes helps you make sure you are getting the right amount of food. The list below tells how big or small some common serving sizes are.  1 oz.........4 stacked dice.  3 oz........Marland KitchenDeck of cards.  1 tsp.......Marland KitchenTip  of little finger.  1 tbs......Marland KitchenMarland KitchenThumb.  2 tbs.......Marland KitchenGolf ball.   cup......Marland KitchenHalf of a fist.  1 cup.......Marland KitchenA fist. SAMPLE DIABETES MEAL PLAN Below is a sample meal plan that includes foods from the grain and starches, dairy, vegetable, fruit, and meat groups. A dietician can individualize a meal plan to fit your calorie needs and tell you the number of servings needed from each food group. However, controlling the total amount of carbohydrates in your meal or snack is more important than making sure you include all of the food groups at every  meal. You may interchange carbohydrate containing foods (dairy, starches, and fruits). The meal plan below is an example of a 2000 calorie diet using carbohydrate counting. This meal plan has 17 carbohydrate servings. Breakfast  1 cup oatmeal (2 carb servings).   cup light yogurt (1 carb serving).  1 cup blueberries (1 carb serving).   cup almonds. Snack  1 large apple (2 carb servings).  1 low-fat string cheese stick. Lunch  Chicken breast salad.  1 cup spinach.   cup chopped tomatoes.  2 oz chicken breast, sliced.  2 tbs low-fat [place] dressing.  12 whole-wheat crackers (2 carb servings).  12 to 15 grapes (1 carb serving).  1 cup low-fat milk (1 carb serving). Snack  1 cup carrots.   cup hummus (1 carb serving). Dinner  3 oz broiled salmon.  1 cup brown rice (3 carb servings). Snack  1  cups steamed broccoli (1 carb serving) drizzled with 1 tsp olive oil and lemon juice.  1 cup light pudding (2 carb servings). DIABETES MEAL PLANNING WORKSHEET Your dietician can use this worksheet to help you decide how many servings of foods and what types of foods are right for you.  BREAKFAST Food Group and Servings / Carb Servings Grain/Starches __________________________________ Dairy __________________________________________ Vegetable ______________________________________ Fruit ___________________________________________ Meat __________________________________________ Fat ____________________________________________ LUNCH Food Group and Servings / Carb Servings Grain/Starches ___________________________________ Dairy ___________________________________________ Fruit ____________________________________________ Meat ___________________________________________ Fat _____________________________________________ [name] Food Group and Servings / Carb Servings Grain/Starches ___________________________________ Dairy  ___________________________________________ Fruit ____________________________________________ Meat ___________________________________________ Fat _____________________________________________ SNACKS Food Group and Servings / Carb Servings Grain/Starches ___________________________________ Dairy ___________________________________________ Vegetable _______________________________________ Fruit ____________________________________________ Meat ___________________________________________ Fat _____________________________________________ DAILY TOTALS Starches _________________________ Vegetable ________________________ Fruit ____________________________ Dairy ____________________________ Meat ____________________________ Fat ______________________________ Document Released: 04/20/2005 Document Revised: 10/16/2011 Document Reviewed: 03/01/2009 [employer] Patient Information 2014 [employer].  Diet for Gastroesophageal Reflux Disease, Adult Reflux is when stomach acid flows up into the esophagus. The esophagus becomes irritated and sore (inflammation). When reflux happens often and is severe, it is called gastroesophageal reflux disease (GERD). What you eat can help ease any discomfort caused by GERD. FOODS OR DRINKS TO AVOID OR LIMIT  Coffee and black tea, with or without caffeine.  Bubbly (carbonated) drinks with caffeine or energy drinks.  Strong spices, such as pepper, cayenne pepper, curry, or chili powder.  Peppermint or spearmint.  Chocolate.  High-fat foods, such as meats, fried food, oils, butter, or nuts.  Fruits and vegetables that cause discomfort. This includes citrus fruits and tomatoes.  Alcohol. If a certain food or drink irritates your GERD, avoid eating or drinking it. THINGS THAT MAY HELP GERD INCLUDE:  Eat meals slowly.  Eat 5 to 6 small meals a day, not 3 large meals.  Do not eat food for a certain amount of time if it causes discomfort.  Wait 3  hours after eating before lying down.  Keep the head of your bed raised 6 to 9 inches (  15 23 centimeters). Put a foam wedge or blocks under the legs of the bed.  Stay active. Weight loss, if needed, may help ease your discomfort.  Wear loose-fitting clothing.  Do not smoke or chew tobacco. Document Released: 01/23/2012 Document Reviewed: 01/23/2012 [facility] Patient Information 2014 [place], [place].  Hyperglycemia Hyperglycemia occurs when the glucose (sugar) in your blood is too high. Hyperglycemia can happen for many reasons, but it most often happens to people who do not know they have diabetes or are not managing their diabetes properly.  CAUSES  Whether you have diabetes or not, there are other causes of hyperglycemia. Hyperglycemia can occur when you have diabetes, but it can also occur in other situations that you might not be as aware of, such as: Diabetes  If you have diabetes and are having problems controlling your blood glucose, hyperglycemia could occur because of some of the following reasons:  Not following your meal plan.  Not taking your diabetes medications or not taking it properly.  Exercising less or doing less activity than you normally do.  Being sick. Pre-diabetes  This cannot be ignored. Before people develop Type 2 diabetes, they almost always have "pre-diabetes." This is when your blood glucose levels are higher than normal, but not yet high enough to be diagnosed as diabetes. Research has shown that some long-term damage to the body, especially the heart and circulatory system, may already be occurring during pre-diabetes. If you take action to manage your blood glucose when you have pre-diabetes, you may delay or prevent Type 2 diabetes from developing. Stress  If you have diabetes, you may be "diet" controlled or on oral medications or insulin to control your diabetes. However, you may find that your blood glucose is higher than usual in the hospital  whether you have diabetes or not. This is often referred to as "stress hyperglycemia." Stress can elevate your blood glucose. This happens because of hormones put out by the body during times of stress. If stress has been the cause of your high blood glucose, it can be followed regularly by your caregiver. That way he/she can make sure your hyperglycemia does not continue to get worse or progress to diabetes. Steroids  Steroids are medications that act on the infection fighting system (immune system) to block inflammation or infection. One side effect can be a rise in blood glucose. Most people can produce enough extra insulin to allow for this rise, but for those who cannot, steroids make blood glucose levels go even higher. It is not unusual for steroid treatments to "uncover" diabetes that is developing. It is not always possible to determine if the hyperglycemia will go away after the steroids are stopped. A special blood test called an A1c is sometimes done to determine if your blood glucose was elevated before the steroids were started. SYMPTOMS  Thirsty.  Frequent urination.  Dry mouth.  Blurred vision.  Tired or fatigue.  Weakness.  Sleepy.  Tingling in feet or leg. DIAGNOSIS  Diagnosis is made by monitoring blood glucose in one or all of the following ways:  A1c test. This is a chemical found in your blood.  Fingerstick blood glucose monitoring.  Laboratory results. TREATMENT  First, knowing the cause of the hyperglycemia is important before the hyperglycemia can be treated. Treatment may include, but is not be limited to:  Education.  Change or adjustment in medications.  Change or adjustment in meal plan.  Treatment for an illness, infection, etc.  More frequent blood glucose  monitoring.  Change in exercise plan.  Decreasing or stopping steroids.  Lifestyle changes. HOME CARE INSTRUCTIONS   Test your blood glucose as directed.  Exercise regularly. Your  caregiver will give you instructions about exercise. Pre-diabetes or diabetes which comes on with stress is helped by exercising.  Eat wholesome, balanced meals. Eat often and at regular, fixed times. Your caregiver or nutritionist will give you a meal plan to guide your sugar intake.  Being at an ideal weight is important. If needed, losing as little as 10 to 15 pounds may help improve blood glucose levels. SEEK MEDICAL CARE IF:   You have questions about medicine, activity, or diet.  You continue to have symptoms (problems such as increased thirst, urination, or weight gain). SEEK IMMEDIATE MEDICAL CARE IF:   You are vomiting or have diarrhea.  Your breath smells fruity.  You are breathing faster or slower.  You are very sleepy or incoherent.  You have numbness, tingling, or pain in your feet or hands.  You have chest pain.  Your symptoms get worse even though you have been following your caregiver's orders.  If you have any other questions or concerns. Document Released: 01/17/2001 Document Revised: 10/16/2011 Document Reviewed: 11/20/2011 [facility] Patient Information 2014 [place], [place].  Type 2 Diabetes Mellitus, Adult Type 2 diabetes mellitus, often simply referred to as type 2 diabetes, is a long-lasting (chronic) disease. In type 2 diabetes, the pancreas does not make enough insulin (a hormone), the cells are less responsive to the insulin that is made (insulin resistance), or both. Normally, insulin moves sugars from food into the tissue cells. The tissue cells use the sugars for energy. The lack of insulin or the lack of normal response to insulin causes excess sugars to build up in the blood instead of going into the tissue cells. As a result, high blood sugar (hyperglycemia) develops. The effect of high sugar (glucose) levels can cause many complications. Type 2 diabetes was also previously called adult-onset diabetes but it can occur at any age.  RISK FACTORS  A  person is predisposed to developing type 2 diabetes if someone in the family has the disease and also has one or more of the following primary risk factors:  Overweight.  An inactive lifestyle.  A history of consistently eating high-calorie foods. Maintaining a normal weight and regular physical activity can reduce the chance of developing type 2 diabetes. SYMPTOMS  A person with type 2 diabetes may not show symptoms initially. The symptoms of type 2 diabetes appear slowly. The symptoms include:  Increased thirst (polydipsia).  Increased urination (polyuria).  Increased urination during the night (nocturia).  Weight loss. This weight loss may be rapid.  Frequent, recurring infections.  Tiredness (fatigue).  Weakness.  Vision changes, such as blurred vision.  Fruity smell to your breath.  Abdominal pain.  Nausea or vomiting.  Cuts or bruises which are slow to heal.  Tingling or numbness in the hands or feet. DIAGNOSIS Type 2 diabetes is frequently not diagnosed until complications of diabetes are present. Type 2 diabetes is diagnosed when symptoms or complications are present and when blood glucose levels are increased. Your blood glucose level may be checked by one or more of the following blood tests:  A fasting blood glucose test. You will not be allowed to eat for at least 8 hours before a blood sample is taken.  A random blood glucose test. Your blood glucose is checked at any time of the day regardless of when  you ate.  A hemoglobin A1c blood glucose test. A hemoglobin A1c test provides information about blood glucose control over the previous 3 months.  An oral glucose tolerance test (OGTT). Your blood glucose is measured after you have not eaten (fasted) for 2 hours and then after you drink a glucose-containing beverage. TREATMENT   You may need to take insulin or diabetes medicine daily to keep blood glucose levels in the desired range.  You will need to  match insulin dosing with exercise and healthy food choices. The treatment goal is to maintain the before meal blood sugar (preprandial glucose) level at 70 130 mg/dL. HOME CARE INSTRUCTIONS   Have your hemoglobin A1c level checked twice a year.  Perform daily blood glucose monitoring as directed by your caregiver.  Monitor urine ketones when you are ill and as directed by your caregiver.  Take your diabetes medicine or insulin as directed by your caregiver to maintain your blood glucose levels in the desired range.  Never run out of diabetes medicine or insulin. It is needed every day.  Adjust insulin based on your intake of carbohydrates. Carbohydrates can raise blood glucose levels but need to be included in your diet. Carbohydrates provide vitamins, minerals, and fiber which are an essential part of a healthy diet. Carbohydrates are found in fruits, vegetables, whole grains, dairy products, legumes, and foods containing added sugars.    Eat healthy foods. Alternate 3 meals with 3 snacks.  Lose weight if overweight.  Carry a medical alert card or wear your medical alert jewelry.  Carry a 15 gram carbohydrate snack with you at all times to treat low blood glucose (hypoglycemia). Some examples of 15 gram carbohydrate snacks include:  Glucose tablets, 3 or 4   Glucose gel, 15 gram tube  Raisins, 2 tablespoons (24 grams)  Jelly beans, 6  Animal crackers, 8  Regular pop, 4 ounces (120 mL)  Gummy treats, 9  Recognize hypoglycemia. Hypoglycemia occurs with blood glucose levels of 70 mg/dL and below. The risk for hypoglycemia increases when fasting or skipping meals, during or after intense exercise, and during sleep. Hypoglycemia symptoms can include:  Tremors or shakes.  Decreased ability to concentrate.  Sweating.  Increased heart rate.  Headache.  Dry mouth.  Hunger.  Irritability.  Anxiety.  Restless sleep.  Altered speech or  coordination.  Confusion.  Treat hypoglycemia promptly. If you are alert and able to safely swallow, follow the 15:15 rule:  Take 15 20 grams of rapid-acting glucose or carbohydrate. Rapid-acting options include glucose gel, glucose tablets, or 4 ounces (120 mL) of fruit juice, regular soda, or low fat milk.  Check your blood glucose level 15 minutes after taking the glucose.  Take 15 20 grams more of glucose if the repeat blood glucose level is still 70 mg/dL or below.  Eat a meal or snack within 1 hour once blood glucose levels return to normal.    Be alert to polyuria and polydipsia which are early signs of hyperglycemia. An early awareness of hyperglycemia allows for prompt treatment. Treat hyperglycemia as directed by your caregiver.  Engage in at least 150 minutes of moderate-intensity physical activity a week, spread over at least 3 days of the week or as directed by your caregiver. In addition, you should engage in resistance exercise at least 2 times a week or as directed by your caregiver.  Adjust your medicine and food intake as needed if you start a new exercise or sport.  Follow your sick day  plan at any time you are unable to eat or drink as usual.  Avoid tobacco use.  Limit alcohol intake to no more than 1 drink per day for nonpregnant women and 2 drinks per day for men. You should drink alcohol only when you are also eating food. Talk with your caregiver whether alcohol is safe for you. Tell your caregiver if you drink alcohol several times a week.  Follow up with your caregiver regularly.  Schedule an eye exam soon after the diagnosis of type 2 diabetes and then annually.  Perform daily skin and foot care. Examine your skin and feet daily for cuts, bruises, redness, nail problems, bleeding, blisters, or sores. A foot exam by a caregiver should be done annually.  Brush your teeth and gums at least twice a day and floss at least once a day. Follow up with your  dentist regularly.  Share your diabetes management plan with your workplace or school.  Stay up-to-date with immunizations.  Learn to manage stress.  Obtain ongoing diabetes education and support as needed.  Participate in, or seek rehabilitation as needed to maintain or improve independence and quality of life. Request a physical or occupational therapy referral if you are having foot or hand numbness or difficulties with grooming, dressing, eating, or physical activity. SEEK MEDICAL CARE IF:   You are unable to eat food or drink fluids for more than 6 hours.  You have nausea and vomiting for more than 6 hours.  Your blood glucose level is over 240 mg/dL.  There is a change in mental status.  You develop an additional serious illness.  You have diarrhea for more than 6 hours.  You have been sick or have had a fever for a couple of days and are not getting better.  You have pain during any physical activity.  SEEK IMMEDIATE MEDICAL CARE IF:  You have difficulty breathing.  You have moderate to large ketone levels. MAKE SURE YOU:  Understand these instructions.  Will watch your condition.  Will get help right away if you are not doing well or get worse. Document Released: 07/24/2005 Document Revised: 04/17/2012 Document Reviewed: 02/20/2012 [facility] Patient Information 2014 [place]

## 2013-10-05 NOTE — Anesthesia Postprocedure Evaluation (Signed)
Anesthesia Post Note  Patient: [name]  Procedure(s) Performed: Procedure(s) (LRB): ENDOSCOPIC RETROGRADE CHOLANGIOPANCREATOGRAPHY (ERCP) (N/A)  Anesthesia type: General  Patient location: PACU  Post pain: Pain level controlled  Post assessment: Post-op Vital signs reviewed  Last Vitals:  Filed Vitals:   10/05/13 0500  BP: 126/63  Pulse: 84  Temp: 36.8 C  Resp: 20    Post vital signs: Reviewed  Level of consciousness: sedated  Complications: No apparent anesthesia complications

## 2013-10-05 NOTE — Discharge Summary (Signed)
Physician Discharge Summary  [name] TZG:[id number] DOB: 11/04/1927 DOA: 10/03/2013  PCP: [name], MD  Admit date: 10/03/2013 Discharge date: 10/05/2013  Recommendations for Outpatient Follow-up:  1. F/U LFTs in 1 week to ensure resolution of transaminitis, hyperbilirubinemia. 2. Endoscopic ultrasound scheduled with Dr. Benson Norway next week. 3. F/U biopsy results.  Will need referral to oncology once pathology back. 4. Recommend close outpatient followup glycemic control given new diagnosis of diabetes.  Discharge Diagnoses:  Principal Problem:    Obstructive jaundice and transaminitis secondary to pancreatic head mass Active Problems:    Hypothyroidism    GERD (gastroesophageal reflux disease)    Pancreatic mass    Tachycardia    Transaminitis    Hyperglycemia    Diabetes mellitus, newly diagnosed   Discharge Condition: Stable.  Diet recommendation: Carbohydrate modified.  History of present illness:  [name] is an 80 y.o. female with a PMH of remote breast cancer, cholecystitis, cholelithiasis and gallstone pancreatitis, status post cholecystectomy 10/2011, who was admitted 10/03/13 with obstructive jaundice following outpatient evaluation with CT scan of the abdomen and pelvis showing a pancreatic mass.  Hospital Course by problem:  Principal Problem:  Obstructive jaundice and transaminitis secondary to pancreatic mass  CT scan findings worrisome for malignant obstruction. GI performed ERCP with stent placement and biopsies 10/04/13. Treated with IV fluids, empiric Cipro, and supportive care. LFTs much better with stent placement. We'll discharge home today, recommend close outpatient followup. Active Problems:  Diabetes mellitus, newly diagnosed, hyperglycemia  Hemoglobin A1c 9.4. CBGs elevated in the 200s. Treated with insulin while in hospital. Seen by diabetes coordinator 10/04/13 with recommendations to send home on a DPP-4 inhibitor such as  Tradgenta 5 mg daily. Will need a glucometer at D/C. Will setup outpatient diabetes education. Recommend close followup glycemic control. Patient instructed to check her blood glucoses before breakfast and supper and to bring a copy of her log to her next doctor's appointment. Hypothyroidism  Continue home dose of Synthroid. TSH 1.303 on 10/01/13.  GERD (gastroesophageal reflux disease)  Continue PPI and Carafate.  Tachycardia  Chronic. Continue beta blocker.   Procedures:  ERCP with stent done by Dr. Benson Norway 10/04/13.  Consultations:  Dr. Carol Ada, Gastroenterology  Discharge Exam: Filed Vitals:   10/05/13 0500  BP: 126/63  Pulse: 84  Temp: 98.2 F (36.8 C)  Resp: 20   Filed Vitals:   10/04/13 1210 10/04/13 1221 10/04/13 2049 10/05/13 0500  BP:  140/65 126/67 126/63  Pulse: 75 81 88 84  Temp:  97.8 F (36.6 C) 98.1 F (36.7 C) 98.2 F (36.8 C)  TempSrc:   Oral Oral  Resp: $Remo'12 16 16 20  'fmwTN$ Height:      Weight:      SpO2: 100% 95% 96% 96%    Gen:  NAD Cardiovascular:  RRR, No M/R/G Respiratory: Lungs CTAB Gastrointestinal: Abdomen soft, NT/ND with normal active bowel sounds. Extremities: No C/E/C   Discharge Instructions  Discharge Orders   Future Appointments Provider Department Dept Phone   11/05/2013 10:30 AM Lafayette Dragon, MD [facility] [phone]   12/31/2013 9:00 AM Unk Pinto, MD [facility] [phone]   Future Orders Complete By Expires   Ambulatory referral to Nutrition and Diabetic Education  As directed    Comments:     New diagnosis of DM.  A1c 9.4%.  PCP- Dr. Unk Pinto  Patient: Please call the [facility] after discharge to schedule  an appointment for diabetes education if you do not hear from the center before discharge  [phone]   Call MD for:  persistant nausea and vomiting  As directed    Call MD for:  severe uncontrolled pain  As  directed    Call MD for:  temperature >100.4  As directed    Call MD for:  As directed    Scheduling Instructions:     Persistently elevated blood sugars above 250.   Diet Carb Modified  As directed    Discharge instructions  As directed    Comments:     No tylenol until you followup with your PCP to check your liver function tests. If they have returned to normal, you can resume taking Tylenol.  Check your blood sugars before breakfast and supper and keep a log of the blood sugar readings to take with you to your next doctor's appointment. Your doctor will use these readings to make adjustments to your diabetes therapy.  Followup with your doctors for your biopsy results and further treatment recommendations. You were cared for by Dr. Jacquelynn Cree  (a hospitalist) during your hospital stay. If you have any questions about your discharge medications or the care you received while you were in the hospital after you are discharged, you can call the unit and ask to speak with the hospitalist on call if the hospitalist that took care of you is not available. Once you are discharged, your primary care physician will handle any further medical issues. Please note that NO REFILLS for any discharge medications will be authorized once you are discharged, as it is imperative that you return to your primary care physician (or establish a relationship with a primary care physician if you do not have one) for your aftercare needs so that they can reassess your need for medications and monitor your lab values.  Any outstanding tests can be reviewed by your PCP at your follow up visit.  It is also important to review any medicine changes with your PCP.  Please bring these d/c instructions with you to your next visit so your physician can review these changes with you.  If you do not have a primary care physician, you can call [phone] for a physician referral.  It is highly recommended that you obtain a PCP for  hospital follow up.   Increase activity slowly  As directed        Medication List    STOP taking these medications       acetaminophen 500 MG tablet  Commonly known as:  TYLENOL      TAKE these medications       estradiol 2 MG vaginal ring  Commonly known as:  ESTRING  Place 2 mg vaginally every 3 (three) months. follow package directions     glucose monitoring kit monitoring kit  1 each by Does not apply route as needed for other. Please dispense testing strips PRN.     hydrochlorothiazide 25 MG tablet  Commonly known as:  HYDRODIURIL  Take 25 mg by mouth daily as needed (swelling).     levothyroxine 100 MCG tablet  Commonly known as:  SYNTHROID, LEVOTHROID  Take 50-100 mcg by mouth See admin instructions. Pt takes 1/2 tab for 50 mcg dose on Tuesday,thursday,saturday,sunday. 100 mcg  On Monday, Wednesday,friday     linagliptin 5 MG Tabs tablet  Commonly known as:  TRADJENTA  Take 1 tablet (5 mg total) by mouth daily.     metoprolol  succinate 25 MG 24 hr tablet  Commonly known as:  TOPROL-XL  Take 25 mg by mouth daily.     omeprazole 20 MG capsule  Commonly known as:  PRILOSEC  Take 20 mg by mouth 2 (two) times daily.     sucralfate 1 G tablet  Commonly known as:  CARAFATE  Take 1 tablet (1 g total) by mouth 2 (two) times daily.           Follow-up Information   Follow up with MCKEOWN,WILLIAM DAVID, MD. Schedule an appointment as soon as possible for a visit in 1 week. [facility] followup.)    Specialty:  Internal Medicine   Contact information:   [address] [place] [address] [phone]       Follow up with Beryle Beams, MD. Schedule an appointment as soon as possible for a visit in 1 week. (To schedule an endoscopic ultrasound.)    Specialty:  Gastroenterology   Contact information:   [address] [place] [address] [phone]        The results of significant diagnostics from this hospitalization  (including imaging, microbiology, ancillary and laboratory) are listed below for reference.    Significant Diagnostic Studies: Dg Chest 2 View  10/01/2013   CLINICAL DATA:  Abdominal discomfort, weakness  EXAM: CHEST  2 VIEW  COMPARISON:  05/22/2012  FINDINGS: Cardiomediastinal silhouette is stable. Again noted status post right mastectomy. Degenerative changes and levoscoliosis lumbar spine. Stable degenerative changes lower thoracic spine. Mild hyperinflation again noted. No acute infiltrate or pleural effusion. No pulmonary edema.  IMPRESSION: No active disease. Status post right mastectomy. Lumbar levoscoliosis.   Electronically Signed   By: Lahoma Crocker M.D.   On: 10/01/2013 15:12   Ct Abdomen Pelvis W Contrast  10/03/2013   CLINICAL DATA:  Mid to lower abdominal pain  EXAM: CT ABDOMEN AND PELVIS WITH CONTRAST  TECHNIQUE: Multidetector CT imaging of the abdomen and pelvis was performed using the standard protocol following bolus administration of intravenous contrast.  CONTRAST:  19mL OMNIPAQUE IOHEXOL 300 MG/ML  SOLN  COMPARISON:  05/22/2013  FINDINGS: There is no pleural or pericardial effusion identified. There is diffuse intrahepatic bile duct dilatation. The common bile duct is markedly dilated measuring up to 2.5 cm, image 24/series 2. There is a mass within the head and uncinate process of the pancreas which measures 3.1 cm, image 25/ series 602. There is not only obstruction of the common bile duct, but the pancreatic duct is also obstructed and dilated. The mass appears to touch but does not encase the superior mesenteric artery, image 29/series 2. The mass also appears to abut the posterior aspect of the portal vein, image 27/series 2. The lumen of the portal vein and superior mesenteric arteries are not compromised. The celiac artery appears uninvolved the spleen is normal. The splenic vein is patent  The adrenal glands both appear normal. Normal appearance of both kidneys. The urinary bladder  appears normal. A pessary device is identified within the dome of vagina. Next  Calcified atherosclerotic disease involves the abdominal aorta. There is no aneurysm. No upper abdominal adenopathy. There is no pelvic or inguinal adenopathy identified.  The stomach is normal. The small bowel loops have a normal appearance without obstruction. The appendix is visualized and appears normal. Normal appearance of the colon.  Review of the visualized bony structures is significant for scoliosis and multi level degenerative disc disease. No aggressive lytic or sclerotic bone lesions.  IMPRESSION: 1. Mass  within the head and uncinate process of the pancreas obstructs both the common bile duct and pancreatic duct. 2. No specific features identified to suggest metastatic disease.   Electronically Signed   By: Kerby Moors M.D.   On: 10/03/2013 13:49   Dg Ercp  10/04/2013   CLINICAL DATA:  Obstructive jaundice, ERCP with stent placement  EXAM: ERCP  TECHNIQUE: Multiple spot images obtained with the fluoroscopic device and submitted for interpretation post-procedure.  COMPARISON:  CT ABD/PELVIS W CM dated 10/03/2013; DG ABD 2 VIEWS dated 10/01/2013; CT ABD/PELV WO CM dated 05/22/2013  FINDINGS: Two spot intraoperative fluoroscopic images during ERCP are provided for review.  Initial image demonstrates an ERCP probe overlying the right upper quadrant with selective cannulation of the common bile duct. There is minimal opacification of the intrahepatic biliary system which appears moderate to severely dilated centrally as demonstrated on recently performed abdominal CT.  Subsequent image demonstrates deployment of a plastic internal biliary stent overlying the distal aspect of the common bile duct.  Post cholecystectomy.  Enteric contrast is seen within the colon.  IMPRESSION: ERCP with plastic biliary stent placement as above.  These images were submitted for radiologic interpretation only. Please see the procedural report for  the amount of contrast and the fluoroscopy time utilized.   Electronically Signed   By: Sandi Mariscal M.D.   On: 10/04/2013 11:59   Dg Abd 2 Views  10/01/2013   CLINICAL DATA:  Abdominal pain  EXAM: ABDOMEN - 2 VIEW  COMPARISON:  CT ABD/PELV WO CM dated 05/22/2013; DG LUMBAR SPINE 1 VIEW dated 05/27/2012  FINDINGS: There is a paucity of bowel gas. A moderate to large amount of stool is appreciated. Severe levoscoliosis appreciated within the lumbar spine. Atherosclerotic calcifications identified within the aorta.  IMPRESSION: Nonobstructive bowel gas pattern with large amount of stool.   Electronically Signed   By: Margaree Mackintosh M.D.   On: 10/01/2013 15:21    Labs:  Basic Metabolic Panel:  Recent Labs Lab 10/01/13 1318 10/03/13 1605 10/04/13 0422 10/05/13 0423  NA 127* 130* 136* 135*  K 4.4 4.7 3.9 4.0  CL 91* 89* 99 98  CO2 $Re'24 26 25 25  'Oue$ GLUCOSE 349* 293* 201* 260*  BUN $Re'21 17 11 12  'sAk$ CREATININE 0.81 0.58 0.58 0.73  CALCIUM 10.5 10.7* 9.5 8.9   GFR Estimated Creatinine Clearance: 42.4 ml/min (by C-G formula based on Cr of 0.73). Liver Function Tests:  Recent Labs Lab 10/01/13 1318 10/03/13 1605 10/04/13 0422 10/05/13 0423  AST 382* 315* 341* 123*  ALT 827* 669* 546* 384*  ALKPHOS 953* 1143* 1019* 830*  BILITOT 5.9* 8.9* 8.6* 3.0*  PROT 7.1 7.5 6.2 5.5*  ALBUMIN 4.4 3.6 2.9* 2.6*    Recent Labs Lab 10/01/13 1318 10/03/13 1605  LIPASE <10 10*  AMYLASE 34  --    CBC:  Recent Labs Lab 10/01/13 1318 10/03/13 1605  WBC 8.7 9.3  NEUTROABS 6.4 5.8  HGB 13.1 12.7  HCT 39.9 36.2  MCV 95.5 88.7  PLT 276 266   CBG:  Recent Labs Lab 10/04/13 1221 10/04/13 1743 10/04/13 2208 10/05/13 0734  GLUCAP 263* 242* 276* 244*   Microbiology Recent Results (from the past 240 hour(s))  URINE CULTURE     Status: None   Collection Time    10/01/13  1:18 PM      Result Value Ref Range Status   Colony Count NO GROWTH   Final   Organism ID, Bacteria NO GROWTH  Final     Time coordinating discharge: 35 minutes.  Signed:  RAMA,CHRISTINA  Pager [phone] [facility] 10/05/2013, 9:42 AM

## 2013-10-05 NOTE — Progress Notes (Signed)
Patient discharged to home with family via wheelchair, discharge instructions reviewed with patient who verbalized understanding. 

## 2013-10-05 NOTE — Plan of Care (Signed)
Problem: Food- and Nutrition-Related Knowledge Deficit (NB-1.1) Goal: Nutrition education Formal process to instruct or train a patient/client in a skill or to impart knowledge to help patients/clients voluntarily manage or modify food choices and eating behavior to maintain or improve health. Outcome: Completed/Met Date Met:  10/05/13  RD consulted for nutrition education regarding diabetes.     Lab Results  Component Value Date    HGBA1C 9.4* 10/01/2013   Patient with newly diagnosed diabetes, likely related to pancreatic head mass. She does have a granddaughter with type 1 diabetes so her and family are familiar with diabetes diet. Order for outpatient DM education placed by DM coordinator. To be D/C today.   Patient with "Living Well with Diabetes" booklet at bedside. Discussed different food groups and their effects on blood sugar, emphasizing carbohydrate-containing foods. Provided list of carbohydrates and recommended serving sizes of common foods. We also discussed carbohydrate intake at meals and snacks.   Discussed importance of controlled and consistent carbohydrate intake throughout the day. Provided examples of ways to balance meals/snacks and encouraged intake of high-fiber, whole grain complex carbohydrates. Teach back method used.  Expect good compliance.  Body mass index is 19.98 kg/(m^2). Pt meets criteria for Normal weight based on current BMI.  Current diet order is Carbohydrate Modified, patient is consuming approximately 90-100% of meals at this time. Labs and medications reviewed. No further nutrition interventions warranted at this time. RD contact information provided. If additional nutrition issues arise, please re-consult RD.  Larey Seat, RD, LDN Pager #: [phone] After-Hours Pager #: [phone]

## 2013-10-06 ENCOUNTER — Other Ambulatory Visit: Payer: Self-pay | Admitting: Gastroenterology

## 2013-10-06 ENCOUNTER — Encounter (HOSPITAL_COMMUNITY): Payer: Self-pay | Admitting: Gastroenterology

## 2013-10-07 ENCOUNTER — Encounter (HOSPITAL_COMMUNITY): Payer: Self-pay

## 2013-10-07 ENCOUNTER — Encounter (HOSPITAL_COMMUNITY)
Admission: RE | Disposition: A | Discharge status: Home / Self Care | Payer: Self-pay | Source: Ambulatory Visit | Attending: Gastroenterology

## 2013-10-07 ENCOUNTER — Ambulatory Visit (HOSPITAL_COMMUNITY)
Admission: RE | Admit: 2013-10-07 | Discharge: 2013-10-07 | Disposition: A | Discharge status: Home / Self Care | Payer: Medicare Other | Source: Ambulatory Visit | Attending: Gastroenterology | Admitting: Gastroenterology

## 2013-10-07 DIAGNOSIS — C250 Malignant neoplasm of head of pancreas: Secondary | ICD-10-CM | POA: Diagnosis not present

## 2013-10-07 DIAGNOSIS — K838 Other specified diseases of biliary tract: Secondary | ICD-10-CM | POA: Insufficient documentation

## 2013-10-07 DIAGNOSIS — Z794 Long term (current) use of insulin: Secondary | ICD-10-CM | POA: Insufficient documentation

## 2013-10-07 DIAGNOSIS — Z79899 Other long term (current) drug therapy: Secondary | ICD-10-CM | POA: Insufficient documentation

## 2013-10-07 DIAGNOSIS — K8689 Other specified diseases of pancreas: Secondary | ICD-10-CM | POA: Insufficient documentation

## 2013-10-07 DIAGNOSIS — Z9089 Acquired absence of other organs: Secondary | ICD-10-CM | POA: Insufficient documentation

## 2013-10-07 DIAGNOSIS — IMO0002 Reserved for concepts with insufficient information to code with codable children: Secondary | ICD-10-CM | POA: Diagnosis not present

## 2013-10-07 DIAGNOSIS — I700 Atherosclerosis of aorta: Secondary | ICD-10-CM | POA: Insufficient documentation

## 2013-10-07 DIAGNOSIS — C801 Malignant (primary) neoplasm, unspecified: Secondary | ICD-10-CM

## 2013-10-07 DIAGNOSIS — M4120 Other idiopathic scoliosis, site unspecified: Secondary | ICD-10-CM | POA: Insufficient documentation

## 2013-10-07 DIAGNOSIS — K869 Disease of pancreas, unspecified: Secondary | ICD-10-CM | POA: Diagnosis present

## 2013-10-07 HISTORY — DX: Malignant (primary) neoplasm, unspecified: C80.1

## 2013-10-07 HISTORY — PX: EUS: SHX5427

## 2013-10-07 LAB — GLUCOSE, CAPILLARY: GLUCOSE-CAPILLARY: 216 mg/dL — AB (ref 70–99)

## 2013-10-07 SURGERY — UPPER ENDOSCOPIC ULTRASOUND (EUS) LINEAR
Anesthesia: Moderate Sedation

## 2013-10-07 MED ORDER — SODIUM CHLORIDE 0.9 % IV SOLN
INTRAVENOUS | Status: DC
  Administered 2013-10-07: 500 mL via INTRAVENOUS

## 2013-10-07 MED ORDER — FENTANYL CITRATE 0.05 MG/ML IJ SOLN
INTRAMUSCULAR | Status: AC
  Filled 2013-10-07: qty 4

## 2013-10-07 MED ORDER — LIDOCAINE VISCOUS 2 % MT SOLN
OROMUCOSAL | Status: AC
  Filled 2013-10-07: qty 15

## 2013-10-07 MED ORDER — MIDAZOLAM HCL 10 MG/2ML IJ SOLN
INTRAMUSCULAR | Status: AC
  Filled 2013-10-07: qty 4

## 2013-10-07 MED ORDER — DIPHENHYDRAMINE HCL 50 MG/ML IJ SOLN
INTRAMUSCULAR | Status: AC
  Filled 2013-10-07: qty 1

## 2013-10-07 MED ORDER — MIDAZOLAM HCL 10 MG/2ML IJ SOLN
INTRAMUSCULAR | Status: DC | PRN
  Administered 2013-10-07: 1 mg via INTRAVENOUS
  Administered 2013-10-07: 1.5 mg via INTRAVENOUS
  Administered 2013-10-07 (×2): 1 mg via INTRAVENOUS
  Administered 2013-10-07: 1.5 mg via INTRAVENOUS

## 2013-10-07 MED ORDER — FENTANYL CITRATE 0.05 MG/ML IJ SOLN
INTRAMUSCULAR | Status: DC | PRN
  Administered 2013-10-07 (×3): 25 ug via INTRAVENOUS

## 2013-10-07 NOTE — Interval H&P Note (Signed)
History and Physical Interval Note:  10/07/2013 3:06 PM  [name]  has presented today for surgery, with the diagnosis of pancreatic cancer  The various methods of treatment have been discussed with the patient and family. After consideration of risks, benefits and other options for treatment, the patient has consented to  Procedure(s): UPPER ENDOSCOPIC ULTRASOUND (EUS) LINEAR (N/A) as a surgical intervention .  The patient's history has been reviewed, patient examined, no change in status, stable for surgery.  I have reviewed the patient's chart and labs.  Questions were answered to the patient's satisfaction.     Kazuto Sevey D

## 2013-10-07 NOTE — H&P (View-Only) (Signed)
Subjective: Feeling much better.  Objective: Vital signs in last 24 hours: Temp:  [97.8 F (36.6 C)-98.2 F (36.8 C)] 98.2 F (36.8 C) (03/01 0500) Pulse Rate:  [75-88] 84 (03/01 0500) Resp:  [10-20] 20 (03/01 0500) BP: (126-142)/(57-67) 126/63 mmHg (03/01 0500) SpO2:  [95 %-100 %] 96 % (03/01 0500) Last BM Date:  (pta)  Intake/Output from previous day: 02/28 0701 - 03/01 0700 In: 3703.3 [P.O.:840; I.V.:2863.3] Out: 2800 [Urine:2800] Intake/Output this shift:    General appearance: alert and no distress GI: soft, non-tender; bowel sounds normal; no masses,  no organomegaly  Lab Results:  Recent Labs  10/03/13 1605  WBC 9.3  HGB 12.7  HCT 36.2  PLT 266   BMET  Recent Labs  10/03/13 1605 10/04/13 0422 10/05/13 0423  NA 130* 136* 135*  K 4.7 3.9 4.0  CL 89* 99 98  CO2 26 25 25   GLUCOSE 293* 201* 260*  BUN 17 11 12   CREATININE 0.58 0.58 0.73  CALCIUM 10.7* 9.5 8.9   LFT  Recent Labs  10/05/13 0423  PROT 5.5*  ALBUMIN 2.6*  AST 123*  ALT 384*  ALKPHOS 830*  BILITOT 3.0*   PT/INR No results found for this basename: LABPROT, INR,  in the last 72 hours Hepatitis Panel No results found for this basename: HEPBSAG, HCVAB, HEPAIGM, HEPBIGM,  in the last 72 hours C-Diff No results found for this basename: CDIFFTOX,  in the last 72 hours Fecal Lactopherrin No results found for this basename: FECLLACTOFRN,  in the last 72 hours  Studies/Results: Ct Abdomen Pelvis W Contrast  10/03/2013   CLINICAL DATA:  Mid to lower abdominal pain  EXAM: CT ABDOMEN AND PELVIS WITH CONTRAST  TECHNIQUE: Multidetector CT imaging of the abdomen and pelvis was performed using the standard protocol following bolus administration of intravenous contrast.  CONTRAST:  150mL OMNIPAQUE IOHEXOL 300 MG/ML  SOLN  COMPARISON:  05/22/2013  FINDINGS: There is no pleural or pericardial effusion identified. There is diffuse intrahepatic bile duct dilatation. The common bile duct is markedly  dilated measuring up to 2.5 cm, image 24/series 2. There is a mass within the head and uncinate process of the pancreas which measures 3.1 cm, image 25/ series 602. There is not only obstruction of the common bile duct, but the pancreatic duct is also obstructed and dilated. The mass appears to touch but does not encase the superior mesenteric artery, image 29/series 2. The mass also appears to abut the posterior aspect of the portal vein, image 27/series 2. The lumen of the portal vein and superior mesenteric arteries are not compromised. The celiac artery appears uninvolved the spleen is normal. The splenic vein is patent  The adrenal glands both appear normal. Normal appearance of both kidneys. The urinary bladder appears normal. A pessary device is identified within the dome of vagina. Next  Calcified atherosclerotic disease involves the abdominal aorta. There is no aneurysm. No upper abdominal adenopathy. There is no pelvic or inguinal adenopathy identified.  The stomach is normal. The small bowel loops have a normal appearance without obstruction. The appendix is visualized and appears normal. Normal appearance of the colon.  Review of the visualized bony structures is significant for scoliosis and multi level degenerative disc disease. No aggressive lytic or sclerotic bone lesions.  IMPRESSION: 1. Mass within the head and uncinate process of the pancreas obstructs both the common bile duct and pancreatic duct. 2. No specific features identified to suggest metastatic disease.   Electronically Signed  By: Kerby Moors M.D.   On: 10/03/2013 13:49   Dg Ercp  10/04/2013   CLINICAL DATA:  Obstructive jaundice, ERCP with stent placement  EXAM: ERCP  TECHNIQUE: Multiple spot images obtained with the fluoroscopic device and submitted for interpretation post-procedure.  COMPARISON:  CT ABD/PELVIS W CM dated 10/03/2013; DG ABD 2 VIEWS dated 10/01/2013; CT ABD/PELV WO CM dated 05/22/2013  FINDINGS: Two spot  intraoperative fluoroscopic images during ERCP are provided for review.  Initial image demonstrates an ERCP probe overlying the right upper quadrant with selective cannulation of the common bile duct. There is minimal opacification of the intrahepatic biliary system which appears moderate to severely dilated centrally as demonstrated on recently performed abdominal CT.  Subsequent image demonstrates deployment of a plastic internal biliary stent overlying the distal aspect of the common bile duct.  Post cholecystectomy.  Enteric contrast is seen within the colon.  IMPRESSION: ERCP with plastic biliary stent placement as above.  These images were submitted for radiologic interpretation only. Please see the procedural report for the amount of contrast and the fluoroscopy time utilized.   Electronically Signed   By: Sandi Mariscal M.D.   On: 10/04/2013 11:59    Medications:  Scheduled: . ciprofloxacin  400 mg Intravenous Q12H  . insulin aspart  0-15 Units Subcutaneous TID WC  . insulin aspart  0-5 Units Subcutaneous QHS  . insulin aspart  4 Units Subcutaneous TID WC  . levothyroxine  50 mcg Oral Once per day on Sun Tue Thu Sat   And  . [START ON 10/06/2013] levothyroxine  100 mcg Oral Once per day on Mon Wed Fri  . linagliptin  5 mg Oral Daily  . metoprolol succinate  25 mg Oral Daily  . ondansetron  4 mg Intravenous Once  . pantoprazole  40 mg Oral Q0600  . sucralfate  1 g Oral BID   Continuous: . sodium chloride 100 mL/hr at 10/05/13 0247    Assessment/Plan: 1) Pancreatic head mass. 2) Obstructive jaundice.   The patient is clinically improved.  Her liver enzymes have declined as well as her bilirubin.  Plan: 1) Okay for D/C home. 2) I will schedule her for an EUS this coming Tuesday versus Friday. 3) Signing off.   LOS: 2 days   Allana Shrestha D 10/05/2013, 8:20 AM

## 2013-10-07 NOTE — Op Note (Signed)
[facility] [place], [address]   ENDOSCOPIC ULTRASOUND PROCEDURE REPORT  PATIENT: [name], [name]  MR#: [id number] BIRTHDATE: 1928-05-26  GENDER: Female ENDOSCOPIST: Carol Ada, MD REFERRED BY: PROCEDURE DATE:  10/07/2013 PROCEDURE:   Upper EUS w/FNA ASA CLASS:      Class II INDICATIONS:   1.  Pancreatic head mass. MEDICATIONS: Fentanyl 75 mcg IV and Versed 6.5 mg IV  DESCRIPTION OF PROCEDURE:   After the risks benefits and alternatives of the procedure were  explained, informed consent was obtained. The patient was then placed in the left, lateral, decubitus postion and IV sedation was administered. Throughout the procedure, the patients blood pressure, pulse and oxygen saturations were monitored continuously.  Under direct visualization, the     endoscope was introduced through the mouth and advanced to the second portion of the duodenum .  Water was used as necessary to provide an acoustic interface.  Upon completion of the imaging, water was removed and the patient was sent to the recovery room in satisfactory condition.   FINDINGS: A large hypoechoic head of the pancreas mass was identified.  It was measured to be 2.9 x 3.3 cm.  The mass is irregularly bordered and it is adjacent to the PV.  No invasion of the portal vein was identified.  Five passes with the 25 gauge FNA needle was performed.  The celaic axis was also negative for any lymph nodes.  the CBD was measured to be at 1.5 cm and the CBD stent was identified.  The PD was dilated up to 1 cm and the body and tail of the pancreas was atrophic.  The left adrenal gland was normal.   The scope was then withdrawn from the patient and the procedure completed.  COMPLICATIONS: There were no complications. ENDOSCOPIC VISUALIZATION: ULTRASONIC VISUALIZATION: STAGING: ENDOSCOPIC IMPRESSION: 1) 2.9 x 3.3 cm pancreatic head mass.  No evidence of portal vein invasion. 2) Dilated CBD to  1.5 cm with plastic stent. 3) Dilated PD to 1 cm.  RECOMMENDATIONS: 1)  Await FNA results. _______________________________ eSignedCarol Ada, MD 10/07/2013 4:43 PM   CC:

## 2013-10-07 NOTE — Discharge Instructions (Signed)
Esophagogastroduodenoscopy °Care After °Refer to this sheet in the next few weeks. These instructions provide you with information on caring for yourself after your procedure. Your caregiver may also give you more specific instructions. Your treatment has been planned according to current medical practices, but problems sometimes occur. Call your caregiver if you have any problems or questions after your procedure.  °HOME CARE INSTRUCTIONS °· Do not eat or drink anything until the numbing medicine (local anesthetic) has worn off and your gag reflex has returned. You will know that the local anesthetic has worn off when you can swallow comfortably. °· Do not drive for 12 hours after the procedure or as directed by your caregiver. °· Only take medicines as directed by your caregiver. °SEEK MEDICAL CARE IF:  °· You cannot stop coughing. °· You are not urinating at all or less than usual. °SEEK IMMEDIATE MEDICAL CARE IF: °· You have difficulty swallowing. °· You cannot eat or drink. °· You have worsening throat or chest pain. °· You have dizziness, lightheadedness, or you faint. °· You have nausea or vomiting. °· You have chills. °· You have a fever. °· You have severe abdominal pain. °· You have black, tarry, or bloody stools. °Document Released: 07/10/2012 Document Reviewed: 07/10/2012 °ExitCare® Patient Information ©2014 [employer]. ° °Conscious Sedation, Adult, Care After °Refer to this sheet in the next few weeks. These instructions provide you with information on caring for yourself after your procedure. Your health care provider may also give you more specific instructions. Your treatment has been planned according to current medical practices, but problems sometimes occur. Call your health care provider if you have any problems or questions after your procedure. °WHAT TO EXPECT AFTER THE PROCEDURE  °After your procedure: °· You may feel sleepy, clumsy, and have poor balance for several hours. °· Vomiting may  occur if you eat too soon after the procedure. °HOME CARE INSTRUCTIONS °· Do not participate in any activities where you could become injured for at least 24 hours. Do not: °· Drive. °· Swim. °· Ride a bicycle. °· Operate heavy machinery. °· Cook. °· Use power tools. °· Climb ladders. °· Work from a high place. °· Do not make important decisions or sign legal documents until you are improved. °· If you vomit, drink water, juice, or soup when you can drink without vomiting. Make sure you have little or no nausea before eating solid foods. °· Only take over-the-counter or prescription medicines for pain, discomfort, or fever as directed by your health care provider. °· Make sure you and your family fully understand everything about the medicines given to you, including what side effects may occur. °· You should not drink alcohol, take sleeping pills, or take medicines that cause drowsiness for at least 24 hours. °· If you smoke, do not smoke without supervision. °· If you are feeling better, you may resume normal activities 24 hours after you were sedated. °· Keep all appointments with your health care provider. °SEEK MEDICAL CARE IF: °· Your skin is pale or bluish in color. °· You continue to feel nauseous or vomit. °· Your pain is getting worse and is not helped by medicine. °· You have bleeding or swelling. °· You are still sleepy or feeling clumsy after 24 hours. °SEEK IMMEDIATE MEDICAL CARE IF: °· You develop a rash. °· You have difficulty breathing. °· You develop any type of allergic problem. °· You have a fever. °MAKE SURE YOU: °· Understand these instructions. °· Will watch your   condition. °· Will get help right away if you are not doing well or get worse. °Document Released: 05/14/2013 Document Reviewed: 02/28/2013 °ExitCare® Patient Information ©2014 [employer]. ° ° °

## 2013-10-08 ENCOUNTER — Encounter (HOSPITAL_COMMUNITY): Payer: Self-pay | Admitting: Gastroenterology

## 2013-10-08 ENCOUNTER — Ambulatory Visit (INDEPENDENT_AMBULATORY_CARE_PROVIDER_SITE_OTHER): Payer: Medicare Other | Admitting: Internal Medicine

## 2013-10-08 VITALS — BP 110/58 | HR 92 | Temp 98.2°F | Resp 18 | Wt 114.4 lb

## 2013-10-08 DIAGNOSIS — E119 Type 2 diabetes mellitus without complications: Secondary | ICD-10-CM

## 2013-10-08 MED ORDER — METFORMIN HCL ER 500 MG PO TB24: ORAL_TABLET | ORAL | Status: DC

## 2013-10-08 NOTE — Patient Instructions (Addendum)
Start Metformin 1 tablet 2 x day with largest meal of day  Every 3 days -- if glucose over 180 mg%   May add 1 pill up to a maximum of 4 tablets per day     Diabetes, Type 2, Am I At Risk? Diabetes is a lasting (chronic) disease. In type 2 diabetes, the pancreas does not make enough insulin, and the body does not respond normally to the insulin that is made. This type of diabetes was also previously called adult onset diabetes. About 90% of all those who have diabetes have type 2. It usually occurs after the age of 79, but can occur at any age.  People develop type 2 diabetes because they do not use insulin properly. Eventually, the pancreas cannot make enough insulin for the body's needs. Over time, the amount of glucose (sugar) in the blood increases. RISK FACTORS  Overweight  the more weight you have, the more resistant your cells become to insulin.  Family history  you are more likely to get diabetes if a parent or sibling has diabetes.  Race certain races get diabetes more.  African Americans.  American Indians.  Asian Americans.  Hispanics.  Pacific Islander.  Inactive exercise helps control weight and helps your cells be more sensitive to insulin.  Gestational diabetes  some women develop diabetes while they are pregnant. This goes away when they deliver. However, they are 50-60% more likely to develop type 2 diabetes at a later time.  Having a baby over 9 pounds  a sign that you may have had gestational diabetes.  Age the risk of diabetes goes up as you get older, especially after age 30.  High blood pressure (hypertension). SYMPTOMS Many people have no signs or symptoms. Symptoms can be so mild that you might not even notice them. Some of these signs are:  Increased thirst.  Increased hunger.  Tiredness (fatigue).  Increased urination, especially at night.  Weight loss.  Blurred vision.  Sores that do not heal. WHO SHOULD BE TESTED?  Anyone 45 years  or older, especially if overweight, should consider getting tested.  If you are younger than 45, overweight, and have one or more of the risk factors, you should consider getting tested. DIAGNOSIS  Fasting blood glucose (FBS). Usually, 2 are done.  FBS 101-125 mg/dl is considered pre-diabetes.  FBS 126 mg/dl or greater is considered diabetes.  2 hour Oral Glucose Tolerance Test (OGTT). This test is preformed by first having you not eat or drink for several hours. You are then given something sweet to drink and your blood glucose is measured fasting, at one hour and 2 hours. This test tells how well you are able to handle sugars or carbohydrates.  Fasting: 60-100 mg/dl.  1 hour: less than 200 mg/dl.  2 hours: less than 140 mg/dl.  A1c A1c is a blood glucose test that gives and average of your blood glucose over 3 months. It is the accepted method to use to diagnose diabetes.  A1c 5.7-6.4% is considered pre-diabetes.  A1c 6.5% or greater is considered diabetes. WHAT DOES IT MEAN TO HAVE PRE-DIABETES? Pre-diabetes means you are at risk for getting type 2 diabetes. Your blood glucose is higher than normal, but not yet high enough to diagnose diabetes. The good news is, if you have pre-diabetes you can reduce the risk of getting diabetes and even return to normal blood glucose levels. With modest weight loss and moderate physical activity, you can delay or prevent type 2  diabetes.  PREVENTION You cannot do anything about race, age or family history, but you can lower your chances of getting diabetes. You can:   Exercise regularly and be active.  Reduce fat and calorie intake.  Make wise food choices as much as you can.  Reduce your intake of salt and alcohol.  Maintain a reasonable weight.  Keep blood pressure in an acceptable range. Take medication if needed.  Not smoke.  Maintain an acceptable cholesterol level (HDL, LDL, Triglycerides). Take medication if needed. DOING MY  PART: GETTING STARTED Making big changes in your life is hard, especially if you are faced with more than one change. You can make it easier by taking these steps:  Make a plan to change behavior.  Decide exactly what you will do and when you will do it.  Plan what you need to get ready.  Think about what might prevent you from reaching your goals.  Find family and friends who will support and encourage you.  Decide how you will reward yourself when you do what you have planned.  Your doctor, dietitian, or counselor can help you make a plan. HERE ARE SOME OF THE AREAS YOU MAY WISH TO CHANGE TO REDUCE YOUR RISK OF DIABETES. If you are overweight or obese, choose sensible ways to get in shape. Even small amounts of weight loss, like 5-10 pounds, can help reduce the effects of insulin resistance and help blood glucose control. Diet  Avoid crash diets. Instead, eat less of the foods you usually have. Limit the amount of fat you eat.  Increase your physical activity. Aim for at least 30 minutes of exercise most days of the week.  Set a reasonable weight-loss goal, such as losing 1 pound a week. Aim for a long-term goal of losing 5-7% of your total body weight.  Make wise food choices most of the time.  What you eat has a big impact on your health. By making wise food choices, you can help control your body weight, blood pressure, and cholesterol.  Take a hard look at the serving sizes of the foods you eat. Reduce serving sizes of meat, desserts, and foods high in fat. Increase your intake of fruits and vegetables.  Limit your fat intake to about 25% of your total calories. For example, if your food choices add up to about 2,000 calories a day, try to eat no more than 56 grams of fat. Your caregiver or a dietitian can help you figure out how much fat to have. You can check food labels for fat content too.  You may also want to reduce the number of calories you have each day.  Keep a  food log. Write down what you eat, how much you eat, and anything else that helps keep you on track.  When you meet your goal, reward yourself with a nonfood item or activity. Exercise  Be physically active every day.  Keep and exercise log. Write down what exercise you did, for how long, and anything else that keeps you on track.  Regular exercise (like brisk walking) tackles several risk factors at once. It helps you lose weight, it keeps your cholesterol and blood pressure under control, and it helps your body use insulin. People who are physically active for 30 minutes a day, 5 days a week, reduced their risk of type 2 diabetes. If you are not very active, you should start slowly at first. Talk with your caregiver first about what kinds of exercise  would be safe for you. Make a plan to increase your activity level with the goal of being active for at least 30 minutes a day, most days of the week.  Choose activities you enjoy. Here are some ways to work extra activity into your daily routine:  Take the stairs rather than an elevator or escalator.  Park at the far end of the lot and walk.  Get off the bus a few stops early and walk the rest of the way.  Walk or bicycle instead of drive whenever you can. Medications Some people need medication to help control their blood pressure or cholesterol levels. If you do, take your medicines as directed. Ask your caregiver whether there are any medicines you can take to prevent type 2 diabetes. Document Released: 07/27/2003 Document Revised: 10/16/2011 Document Reviewed: 04/21/2009 [facility] Patient Information 2014 [place].

## 2013-10-08 NOTE — Progress Notes (Signed)
Subjective:    Patient ID: [name], female    DOB: [birth year], 80 y.o.   MRN: [id number]  HPI Very nice 80 yo WWF presentin about 1 week ago with painless jaundice found due to a pancreatic mass and had a 2 day hospitalization for stenting by Dr Benson Norway. Yesterday she had EUS and Bx by Dr Benson Norway. Today she is seen for management of new onset T2 NIDDM. In the hospital, she was managed with SS insulin and discharged home on Tradjenta - now for the last 2 days and her CBG's are running in the 230-240 mg% range. She denies any diabetic polys.      Medication List       This list is accurate as of: 10/08/13  9:23 PM.  Always use your most recent med list.               estradiol 2 MG vaginal ring  Commonly known as:  ESTRING  Place 2 mg vaginally every 3 (three) months. follow package directions     glucose monitoring kit monitoring kit  1 each by Does not apply route as needed for other. Please dispense testing strips PRN.     hydrochlorothiazide 25 MG tablet  Commonly known as:  HYDRODIURIL  Take 25 mg by mouth daily as needed (swelling).     levothyroxine 100 MCG tablet  Commonly known as:  SYNTHROID, LEVOTHROID  Take 50-100 mcg by mouth See admin instructions. Pt takes 1/2 tab for 50 mcg dose on Tuesday,thursday,saturday,sunday. 100 mcg  On Monday, Wednesday,friday     linagliptin 5 MG Tabs tablet  Commonly known as:  TRADJENTA  Take 1 tablet (5 mg total) by mouth daily.     metFORMIN 500 MG 24 hr tablet  Commonly known as:  GLUCOPHAGE XR  1 to 2 tablets 2 x day for diabetes     metoprolol succinate 25 MG 24 hr tablet  Commonly known as:  TOPROL-XL  Take 25 mg by mouth daily.     omeprazole 20 MG capsule  Commonly known as:  PRILOSEC  Take 20 mg by mouth 2 (two) times daily.     sucralfate 1 G tablet  Commonly known as:  CARAFATE  Take 1 tablet (1 g total) by mouth 2 (two) times daily.        Allergies  Allergen Reactions  . Aspirin     GI upset  .  Gemfibrozil   . Nsaids Other (See Comments)    "irritates my stomach"  . Pravastatin     Muscle aches  . Pse Hcl-Dm Hbr-Guaifenesin Tan     Palpitations    Past Medical History  Diagnosis Date  . Arthritis     Left knee, s/p cortisone injection  . GERD (gastroesophageal reflux disease)   . Complication of anesthesia     Very hard to wake up after anesthesia  . Dizziness   . Seasonal allergies   . Frequency of urination   . Dysrhythmia     Sinus tachycardia can get up to 108. Started after chemo   . Hypertension   . Hyperlipidemia   . Thyroid disease   . Hypothyroidism   . Breast cancer 2001    S/P mastectomy, XRT and chemo   Review of Systems Neg to above  BP: 110/58  Pulse: 92  Temp: 98.2 F (36.8 C)  Resp: 18    Objective:   Physical Exam No formal exam today with patient appearing non-icteric and in  no distress. Patient is well informed of her probable Dx of pancreatic Ca and is dealing with the levity of the situation very well.  Assessment & Plan:   New Onset T2 NIDDM  Rx Metformin 500 mg XR - to begin 1 tab bid and increase by 1 pill daily every 3 days if glucoses are above 180 mg% to a max of 4 tabs/day. Patient to return in 1 week with list of bid glucoses.

## 2013-10-10 ENCOUNTER — Other Ambulatory Visit: Payer: Self-pay | Admitting: Gastroenterology

## 2013-10-10 ENCOUNTER — Telehealth: Payer: Self-pay | Admitting: *Deleted

## 2013-10-10 NOTE — Telephone Encounter (Signed)
Spoke with patient and confirmed appointment with Dr. [name] for 10/16/13.  Contact names, numbers, and directions were provided.

## 2013-10-13 ENCOUNTER — Ambulatory Visit: Payer: Medicare Other | Admitting: *Deleted

## 2013-10-14 ENCOUNTER — Other Ambulatory Visit (INDEPENDENT_AMBULATORY_CARE_PROVIDER_SITE_OTHER): Payer: Self-pay | Admitting: General Surgery

## 2013-10-14 ENCOUNTER — Ambulatory Visit (INDEPENDENT_AMBULATORY_CARE_PROVIDER_SITE_OTHER): Payer: Medicare Other | Admitting: General Surgery

## 2013-10-14 ENCOUNTER — Encounter (INDEPENDENT_AMBULATORY_CARE_PROVIDER_SITE_OTHER): Payer: Self-pay | Admitting: General Surgery

## 2013-10-14 VITALS — BP 110/70 | HR 82 | Temp 97.9°F | Resp 16 | Ht 61.0 in | Wt 114.0 lb

## 2013-10-14 DIAGNOSIS — C259 Malignant neoplasm of pancreas, unspecified: Secondary | ICD-10-CM

## 2013-10-14 NOTE — Progress Notes (Signed)
Chief Complaint  Patient presents with  . New Evaluation    Adenocarcinoma of pancreas head    HISTORY: Pt is an 80 yo F referred by Dr. Benson Norway with new pancreatic cancer.  She started feeling bad at the beginning of January with "feeling terrible," diarrhea, and pale stool.  She was placed on antidiarrheal, maybe questran?  She states it was a powder.  She eventually underwent labwork and was seen to have elevated liver and pancreas labs.  She underwent biliary stenting and ERCP 3/3.  She also was found to be diabetic this January.  She has lost some weight (5-10 pounds).  CT was performed and showed classic double duct sign.  EUS has now been performed and FNA was positive for adenocarcinoma.  Metformin and tradjenta have been controlling her DM.  She had mastectomy for breast cancer 13 years ago.    Past Medical History  Diagnosis Date  . Arthritis     Left knee, s/p cortisone injection  . GERD (gastroesophageal reflux disease)   . Complication of anesthesia     Very hard to wake up after anesthesia  . Dizziness   . Seasonal allergies   . Frequency of urination   . Dysrhythmia     Sinus tachycardia can get up to 108. Started after chemo   . Hypertension   . Hyperlipidemia   . Thyroid disease   . Hypothyroidism   . Breast cancer 2001    S/P mastectomy, XRT and chemo    Past Surgical History  Procedure Laterality Date  . Joint replacement      right knee  . Carpal tunnel release      bilateral  . Incontinence surgery    . Cataract extraction      left  . Mastectomy      right due to cancer  . Ercp  10/06/2011    Procedure: ENDOSCOPIC RETROGRADE CHOLANGIOPANCREATOGRAPHY (ERCP);  Surgeon: Gatha Mayer, MD;  Location: [facility];  Service: Endoscopy;  Laterality: N/A;  or general per anesthesia  . Sphincterotomy  10/06/2011    Procedure: SPHINCTEROTOMY;  Surgeon: Gatha Mayer, MD;  Location: [facility];  Service: Endoscopy;;  . Cholecystectomy  10/07/2011    Procedure:  LAPAROSCOPIC CHOLECYSTECTOMY WITH INTRAOPERATIVE CHOLANGIOGRAM;  Surgeon: Edward Jolly, MD;  Location: [facility];  Service: General;  Laterality: N/A;  . Tonsillectomy    . Colonoscopy    . Eye surgery    . Lumbar laminectomy/decompression microdiscectomy  05/27/2012    Procedure: LUMBAR LAMINECTOMY/DECOMPRESSION MICRODISCECTOMY 1 LEVEL;  Surgeon: Ophelia Charter, MD;  Location: [facility];  Service: Neurosurgery;  Laterality: Bilateral;  Lumbar four laminectomy, with bilateral lumbar three laminotomies.  . Ercp N/A 10/04/2013    Procedure: ENDOSCOPIC RETROGRADE CHOLANGIOPANCREATOGRAPHY (ERCP);  Surgeon: Beryle Beams, MD;  Location: [facility];  Service: Gastroenterology;  Laterality: N/A;  . Eus N/A 10/07/2013    Procedure: UPPER ENDOSCOPIC ULTRASOUND (EUS) LINEAR;  Surgeon: Beryle Beams, MD;  Location: [facility];  Service: Endoscopy;  Laterality: N/A;    Current Outpatient Prescriptions  Medication Sig Dispense Refill  . Cholecalciferol (VITAMIN D-3) 5000 UNITS TABS Take by mouth.      . estradiol (ESTRING) 2 MG vaginal ring Place 2 mg vaginally every 3 (three) months. follow package directions      . Flaxseed, Linseed, (FLAXSEED OIL PO) Take by mouth.      Marland Kitchen glucose monitoring kit (FREESTYLE) monitoring kit 1 each by Does not apply route as  needed for other. Please dispense testing strips PRN.  1 each  0  . levothyroxine (SYNTHROID, LEVOTHROID) 100 MCG tablet Take 50-100 mcg by mouth See admin instructions. Pt takes 1/2 tab for 50 mcg dose on Tuesday,thursday,saturday,sunday. 100 mcg  On Monday, Wednesday,friday      . linagliptin (TRADJENTA) 5 MG TABS tablet Take 1 tablet (5 mg total) by mouth daily.  30 tablet  3  . Magnesium 250 MG TABS Take by mouth.      . metFORMIN (GLUCOPHAGE XR) 500 MG 24 hr tablet 1 to 2 tablets 2 x day for diabetes  120 tablet  99  . metoprolol succinate (TOPROL-XL) 25 MG 24 hr tablet Take 25 mg by mouth daily.      . Omega-3 Fatty Acids (FISH OIL) 1000 MG  CAPS Take by mouth.      Marland Kitchen omeprazole (PRILOSEC) 20 MG capsule Take 20 mg by mouth 2 (two) times daily.       . sucralfate (CARAFATE) 1 G tablet Take 1 tablet (1 g total) by mouth 2 (two) times daily.  60 tablet  5  . TRUEPLUS LANCETS 33G MISC       . hydrochlorothiazide (HYDRODIURIL) 25 MG tablet Take 25 mg by mouth daily as needed (swelling).        No current facility-administered medications for this visit.     Allergies  Allergen Reactions  . Aspirin     GI upset  . Gemfibrozil   . Nsaids Other (See Comments)    "irritates my stomach"  . Pravastatin     Muscle aches  . Pse Hcl-Dm Hbr-Guaifenesin Tan     Palpitations     Family History  Problem Relation Age of Onset  . Stroke Mother   . Breast cancer Mother   . Heart disease Mother   . Stroke Father   . Heart disease Father   . Heart attack Father   . Breast cancer Daughter   . Cerebral aneurysm Sister   . Hyperlipidemia Son   . Hypertension Son      History   Social History  . Marital Status: Widowed    Spouse Name: N/A    Number of Children: 2  . Years of Education: N/A   Social History Main Topics  . Smoking status: Former Smoker    Quit date: 10/01/1957  . Smokeless tobacco: Never Used     Comment: Quit 50 years ago.  . Alcohol Use: Yes     Comment: occasionally; socially; wine  . Drug Use: No  . Sexual Activity: None   Other Topics Concern  . None   Social History Narrative   Widowed.  Lives alone, ambulates with a cane.     REVIEW OF SYSTEMS - PERTINENT POSITIVES ONLY: 12 point review of systems negative other than HPI and PMH   EXAM: Filed Vitals:   10/14/13 1430  BP: 110/70  Pulse: 82  Temp: 97.9 F (36.6 C)  Resp: 16    Wt Readings from Last 3 Encounters:  10/14/13 114 lb (51.71 kg)  10/08/13 114 lb 6.4 oz (51.891 kg)  10/03/13 111 lb 1.6 oz (50.395 kg)     Gen:  No acute distress.  Well nourished and well groomed.  Able to get on exam table reasonably easily.    Neurological: Alert and oriented to person, place, and time. Coordination normal.  Head: Normocephalic and atraumatic.  Eyes: Conjunctivae are normal. Pupils are equal, round, and reactive to light. No scleral  icterus.  Neck: Normal range of motion. Neck supple. No tracheal deviation or thyromegaly present.  Cardiovascular: Normal rate, regular rhythm, normal heart sounds and intact distal pulses.  Exam reveals no gallop and no friction rub.  No murmur heard. Respiratory: Effort normal.  No respiratory distress. No chest wall tenderness. Breath sounds normal.  No wheezes, rales or rhonchi.  GI: Soft. Bowel sounds are normal. The abdomen is soft and nontender.  There is no rebound and no guarding. Non distended. Musculoskeletal: Normal range of motion. Extremities are nontender.  Lymphadenopathy: No cervical, preauricular, postauricular or axillary adenopathy is present Skin: Skin is warm and dry. No rash noted. No diaphoresis. No erythema. No pallor. No clubbing, cyanosis, or edema.   Psychiatric: Normal mood and affect. Behavior is normal. Judgment and thought content normal.    LABORATORY RESULTS: Available labs are reviewed   Recent Results (from the past 2160 hour(s))  CBC WITH DIFFERENTIAL     Status: None   Collection Time    08/14/13  2:28 PM      Result Value Ref Range   WBC 7.8  4.0 - 10.5 K/uL   RBC 4.13  3.87 - 5.11 MIL/uL   Hemoglobin 12.8  12.0 - 15.0 g/dL   HCT 01.4  15.9 - 73.3 %   MCV 92.7  78.0 - 100.0 fL   MCH 31.0  26.0 - 34.0 pg   MCHC 33.4  30.0 - 36.0 g/dL   RDW 12.5  08.7 - 19.9 %   Platelets 255  150 - 400 K/uL   Neutrophils Relative % 64  43 - 77 %   Neutro Abs 5.0  1.7 - 7.7 K/uL   Lymphocytes Relative 25  12 - 46 %   Lymphs Abs 1.9  0.7 - 4.0 K/uL   Monocytes Relative 10  3 - 12 %   Monocytes Absolute 0.8  0.1 - 1.0 K/uL   Eosinophils Relative 1  0 - 5 %   Eosinophils Absolute 0.1  0.0 - 0.7 K/uL   Basophils Relative 0  0 - 1 %   Basophils Absolute  0.0  0.0 - 0.1 K/uL   Smear Review Criteria for review not met    BASIC METABOLIC PANEL WITH GFR     Status: Abnormal   Collection Time    08/14/13  2:28 PM      Result Value Ref Range   Sodium 130 (*) 135 - 145 mEq/L   Potassium 4.3  3.5 - 5.3 mEq/L   Chloride 93 (*) 96 - 112 mEq/L   CO2 29  19 - 32 mEq/L   Glucose, Bld 274 (*) 70 - 99 mg/dL   BUN 15  6 - 23 mg/dL   Creat 4.12  9.04 - 7.53 mg/dL   Calcium 9.6  8.4 - 39.1 mg/dL   GFR, Est African American 79     GFR, Est Non African American 68     Comment:       The estimated GFR is a calculation valid for adults (>=64 years old)     that uses the CKD-EPI algorithm to adjust for age and sex. It is       not to be used for children, pregnant women, hospitalized patients,        patients on dialysis, or with rapidly changing kidney function.     According to the NKDEP, eGFR >89 is normal, 60-89 shows mild     impairment, 30-59 shows moderate impairment, 15-29 shows  severe     impairment and <15 is ESRD.        HEPATIC FUNCTION PANEL     Status: None   Collection Time    08/14/13  2:28 PM      Result Value Ref Range   Total Bilirubin 0.5  0.3 - 1.2 mg/dL   Bilirubin, Direct 0.1  0.0 - 0.3 mg/dL   Indirect Bilirubin 0.4  0.0 - 0.9 mg/dL   Alkaline Phosphatase 100  39 - 117 U/L   AST 18  0 - 37 U/L   ALT 20  0 - 35 U/L   Total Protein 6.7  6.0 - 8.3 g/dL   Albumin 4.0  3.5 - 5.2 g/dL  TSH     Status: None   Collection Time    08/14/13  2:28 PM      Result Value Ref Range   TSH 2.931  0.350 - 4.500 uIU/mL  HEPATITIS A ANTIBODY, TOTAL     Status: Abnormal   Collection Time    08/14/13  2:28 PM      Result Value Ref Range   Hep A Total Ab REACTIVE (*) NON REACTIVE  HEPATITIS B SURFACE ANTIBODY     Status: None   Collection Time    08/14/13  2:28 PM      Result Value Ref Range   Hep B S Ab NEG  NEGATIVE  HEPATITIS C ANTIBODY     Status: None   Collection Time    08/14/13  2:28 PM      Result Value Ref Range   HCV Ab  NEGATIVE  NEGATIVE  AMYLASE     Status: None   Collection Time    08/14/13  2:28 PM      Result Value Ref Range   Amylase 31  0 - 105 U/L  HEPATIC FUNCTION PANEL     Status: Abnormal   Collection Time    10/01/13  1:18 PM      Result Value Ref Range   Total Bilirubin 5.9 (*) 0.2 - 1.2 mg/dL   Comment: ** Please note change in reference range(s). **   Bilirubin, Direct 3.8 (*) 0.0 - 0.3 mg/dL   Indirect Bilirubin 2.1 (*) 0.2 - 1.2 mg/dL   Comment: ** Please note change in reference range(s). **   Alkaline Phosphatase 953 (*) 39 - 117 U/L   AST 382 (*) 0 - 37 U/L   ALT 827 (*) 0 - 35 U/L   Comment: Result repeated and verified.     Result confirmed by automatic dilution.   Total Protein 7.1  6.0 - 8.3 g/dL   Albumin 4.4  3.5 - 5.2 g/dL  LIPID PANEL     Status: Abnormal   Collection Time    10/01/13  1:18 PM      Result Value Ref Range   Cholesterol 260 (*) 0 - 200 mg/dL   Comment: ATP III Classification:           < 200        mg/dL        Desirable          200 - 239     mg/dL        Borderline High          >= 240        mg/dL        High         Triglycerides 164 (*) <150 mg/dL  HDL 56  >39 mg/dL   Total CHOL/HDL Ratio 4.6     VLDL 33  0 - 40 mg/dL   LDL Cholesterol 171 (*) 0 - 99 mg/dL   Comment:       Total Cholesterol/HDL Ratio:CHD Risk                            Coronary Heart Disease Risk Table                                            Men       Women              1/2 Average Risk              3.4        3.3                  Average Risk              5.0        4.4               2X Average Risk              9.6        7.1               3X Average Risk             23.4       11.0     Use the calculated Patient Ratio above and the CHD Risk table      to determine the patient's CHD Risk.     ATP III Classification (LDL):           < 100        mg/dL         Optimal          100 - 129     mg/dL         Near or Above Optimal          130 - 159     mg/dL          Borderline High          160 - 189     mg/dL         High           > 190        mg/dL         Very High        BASIC METABOLIC PANEL WITH GFR     Status: Abnormal   Collection Time    10/01/13  1:18 PM      Result Value Ref Range   Sodium 127 (*) 135 - 145 mEq/L   Potassium 4.4  3.5 - 5.3 mEq/L   Chloride 91 (*) 96 - 112 mEq/L   CO2 24  19 - 32 mEq/L   Glucose, Bld 349 (*) 70 - 99 mg/dL   BUN 21  6 - 23 mg/dL   Creat 0.81  0.50 - 1.10 mg/dL   Calcium 10.5  8.4 - 10.5 mg/dL   GFR, Est African American 78     GFR, Est Non African American 67     Comment:       The estimated  GFR is a calculation valid for adults (>=50 years old)     that uses the CKD-EPI algorithm to adjust for age and sex. It is       not to be used for children, pregnant women, hospitalized patients,        patients on dialysis, or with rapidly changing kidney function.     According to the NKDEP, eGFR >89 is normal, 60-89 shows mild     impairment, 30-59 shows moderate impairment, 15-29 shows severe     impairment and <15 is ESRD.        INSULIN, FASTING     Status: None   Collection Time    10/01/13  1:18 PM      Result Value Ref Range   Insulin fasting, serum 7  3 - 28 uIU/mL  HEMOGLOBIN A1C     Status: Abnormal   Collection Time    10/01/13  1:18 PM      Result Value Ref Range   Hemoglobin A1C 9.4 (*) <5.7 %   Comment:                                                                            According to the ADA Clinical Practice Recommendations for 2011, when     HbA1c is used as a screening test:             >=6.5%   Diagnostic of Diabetes Mellitus                (if abnormal result is confirmed)           5.7-6.4%   Increased risk of developing Diabetes Mellitus           References:Diagnosis and Classification of Diabetes Mellitus,Diabetes     VWUJ,8119,14(NWGNF 1):S62-S69 and Standards of Medical Care in             Diabetes - 2011,Diabetes Care,2011,34 (Suppl 1):S11-S61.         Mean  Plasma Glucose 223 (*) <117 mg/dL  CBC WITH DIFFERENTIAL     Status: None   Collection Time    10/01/13  1:18 PM      Result Value Ref Range   WBC 8.7  4.0 - 10.5 K/uL   RBC 4.18  3.87 - 5.11 MIL/uL   Hemoglobin 13.1  12.0 - 15.0 g/dL   HCT 39.9  36.0 - 46.0 %   MCV 95.5  78.0 - 100.0 fL   MCH 31.3  26.0 - 34.0 pg   MCHC 32.8  30.0 - 36.0 g/dL   RDW 15.3  11.5 - 15.5 %   Platelets 276  150 - 400 K/uL   Neutrophils Relative % 73  43 - 77 %   Neutro Abs 6.4  1.7 - 7.7 K/uL   Lymphocytes Relative 17  12 - 46 %   Lymphs Abs 1.5  0.7 - 4.0 K/uL   Monocytes Relative 7  3 - 12 %   Monocytes Absolute 0.6  0.1 - 1.0 K/uL   Eosinophils Relative 2  0 - 5 %   Eosinophils Absolute 0.2  0.0 - 0.7 K/uL   Basophils Relative 1  0 - 1 %  Basophils Absolute 0.1  0.0 - 0.1 K/uL   Smear Review Criteria for review not met    TSH     Status: None   Collection Time    10/01/13  1:18 PM      Result Value Ref Range   TSH 1.303  0.350 - 4.500 uIU/mL  URINE CULTURE     Status: None   Collection Time    10/01/13  1:18 PM      Result Value Ref Range   Colony Count NO GROWTH     Organism ID, Bacteria NO GROWTH    URINALYSIS, ROUTINE W REFLEX MICROSCOPIC     Status: Abnormal   Collection Time    10/01/13  1:18 PM      Result Value Ref Range   Color, Urine YELLOW  YELLOW   APPearance CLEAR  CLEAR   Specific Gravity, Urine 1.013  1.005 - 1.030   pH 6.0  5.0 - 8.0   Glucose, UA > 1000 (*) NEG mg/dL   Bilirubin Urine SMALL (*) NEG   Ketones, ur TRACE (*) NEG mg/dL   Hgb urine dipstick NEG  NEG   Protein, ur NEG  NEG mg/dL   Urobilinogen, UA 0.2  0.0 - 1.0 mg/dL   Nitrite NEG  NEG   Leukocytes, UA NEG  NEG  IRON AND TIBC     Status: None   Collection Time    10/01/13  1:18 PM      Result Value Ref Range   Iron 109  42 - 145 ug/dL   UIBC 314  970 - 263 ug/dL   TIBC 785  885 - 027 ug/dL   %SAT 28  20 - 55 %  LIPASE     Status: None   Collection Time    10/01/13  1:18 PM      Result Value Ref  Range   Lipase <10  0 - 75 U/L  AMYLASE     Status: None   Collection Time    10/01/13  1:18 PM      Result Value Ref Range   Amylase 34  0 - 105 U/L  URINALYSIS, MICROSCOPIC ONLY     Status: None   Collection Time    10/01/13  1:18 PM      Result Value Ref Range   Squamous Epithelial / LPF RARE  RARE   Crystals NONE SEEN  NONE SEEN   Casts NONE SEEN  NONE SEEN   WBC, UA 0-2  <3 WBC/hpf   RBC / HPF 0-2  <3 RBC/hpf   Bacteria, UA RARE  RARE  CBC WITH DIFFERENTIAL     Status: None   Collection Time    10/03/13  4:05 PM      Result Value Ref Range   WBC 9.3  4.0 - 10.5 K/uL   RBC 4.08  3.87 - 5.11 MIL/uL   Hemoglobin 12.7  12.0 - 15.0 g/dL   HCT 74.1  28.7 - 86.7 %   MCV 88.7  78.0 - 100.0 fL   MCH 31.1  26.0 - 34.0 pg   MCHC 35.1  30.0 - 36.0 g/dL   RDW 67.2  09.4 - 70.9 %   Platelets 266  150 - 400 K/uL   Neutrophils Relative % 62  43 - 77 %   Neutro Abs 5.8  1.7 - 7.7 K/uL   Lymphocytes Relative 23  12 - 46 %   Lymphs Abs 2.1  0.7 - 4.0 K/uL  Monocytes Relative 11  3 - 12 %   Monocytes Absolute 1.0  0.1 - 1.0 K/uL   Eosinophils Relative 3  0 - 5 %   Eosinophils Absolute 0.3  0.0 - 0.7 K/uL   Basophils Relative 1  0 - 1 %   Basophils Absolute 0.1  0.0 - 0.1 K/uL  COMPREHENSIVE METABOLIC PANEL     Status: Abnormal   Collection Time    10/03/13  4:05 PM      Result Value Ref Range   Sodium 130 (*) 137 - 147 mEq/L   Potassium 4.7  3.7 - 5.3 mEq/L   Chloride 89 (*) 96 - 112 mEq/L   CO2 26  19 - 32 mEq/L   Glucose, Bld 293 (*) 70 - 99 mg/dL   BUN 17  6 - 23 mg/dL   Creatinine, Ser 0.58  0.50 - 1.10 mg/dL   Calcium 10.7 (*) 8.4 - 10.5 mg/dL   Total Protein 7.5  6.0 - 8.3 g/dL   Albumin 3.6  3.5 - 5.2 g/dL   AST 315 (*) 0 - 37 U/L   ALT 669 (*) 0 - 35 U/L   Alkaline Phosphatase 1143 (*) 39 - 117 U/L   Total Bilirubin 8.9 (*) 0.3 - 1.2 mg/dL   GFR calc non Af Amer 83 (*) >90 mL/min   GFR calc Af Amer >90  >90 mL/min   Comment: (NOTE)     The eGFR has been  calculated using the CKD EPI equation.     This calculation has not been validated in all clinical situations.     eGFR's persistently <90 mL/min signify possible Chronic Kidney     Disease.  LIPASE, BLOOD     Status: Abnormal   Collection Time    10/03/13  4:05 PM      Result Value Ref Range   Lipase 10 (*) 11 - 59 U/L  COMPREHENSIVE METABOLIC PANEL     Status: Abnormal   Collection Time    10/04/13  4:22 AM      Result Value Ref Range   Sodium 136 (*) 137 - 147 mEq/L   Potassium 3.9  3.7 - 5.3 mEq/L   Comment: DELTA CHECK NOTED     REPEATED TO VERIFY   Chloride 99  96 - 112 mEq/L   Comment: DELTA CHECK NOTED     REPEATED TO VERIFY   CO2 25  19 - 32 mEq/L   Glucose, Bld 201 (*) 70 - 99 mg/dL   BUN 11  6 - 23 mg/dL   Creatinine, Ser 0.58  0.50 - 1.10 mg/dL   Calcium 9.5  8.4 - 10.5 mg/dL   Total Protein 6.2  6.0 - 8.3 g/dL   Albumin 2.9 (*) 3.5 - 5.2 g/dL   AST 341 (*) 0 - 37 U/L   ALT 546 (*) 0 - 35 U/L   Alkaline Phosphatase 1019 (*) 39 - 117 U/L   Total Bilirubin 8.6 (*) 0.3 - 1.2 mg/dL   GFR calc non Af Amer 83 (*) >90 mL/min   GFR calc Af Amer >90  >90 mL/min   Comment: (NOTE)     The eGFR has been calculated using the CKD EPI equation.     This calculation has not been validated in all clinical situations.     eGFR's persistently <90 mL/min signify possible Chronic Kidney     Disease.  CANCER ANTIGEN 19-9     Status: Abnormal   Collection Time  10/04/13  4:22 AM      Result Value Ref Range   CA 19-9 640.6 (*) <35.0 U/mL   Comment: Performed at [place], [facility]     Status: Abnormal   Collection Time    10/04/13 12:21 PM      Result Value Ref Range   Glucose-Capillary 263 (*) 70 - 99 mg/dL   Comment 1 Documented in Chart     Comment 2 Notify RN    GLUCOSE, CAPILLARY     Status: Abnormal   Collection Time    10/04/13  5:43 PM      Result Value Ref Range   Glucose-Capillary 242 (*) 70 - 99 mg/dL  GLUCOSE, CAPILLARY     Status:  Abnormal   Collection Time    10/04/13 10:08 PM      Result Value Ref Range   Glucose-Capillary 276 (*) 70 - 99 mg/dL   Comment 1 Notify RN    COMPREHENSIVE METABOLIC PANEL     Status: Abnormal   Collection Time    10/05/13  4:23 AM      Result Value Ref Range   Sodium 135 (*) 137 - 147 mEq/L   Potassium 4.0  3.7 - 5.3 mEq/L   Chloride 98  96 - 112 mEq/L   CO2 25  19 - 32 mEq/L   Glucose, Bld 260 (*) 70 - 99 mg/dL   BUN 12  6 - 23 mg/dL   Creatinine, Ser 0.73  0.50 - 1.10 mg/dL   Calcium 8.9  8.4 - 10.5 mg/dL   Total Protein 5.5 (*) 6.0 - 8.3 g/dL   Albumin 2.6 (*) 3.5 - 5.2 g/dL   AST 123 (*) 0 - 37 U/L   ALT 384 (*) 0 - 35 U/L   Alkaline Phosphatase 830 (*) 39 - 117 U/L   Total Bilirubin 3.0 (*) 0.3 - 1.2 mg/dL   GFR calc non Af Amer 77 (*) >90 mL/min   GFR calc Af Amer 89 (*) >90 mL/min   Comment: (NOTE)     The eGFR has been calculated using the CKD EPI equation.     This calculation has not been validated in all clinical situations.     eGFR's persistently <90 mL/min signify possible Chronic Kidney     Disease.  GLUCOSE, CAPILLARY     Status: Abnormal   Collection Time    10/05/13  7:34 AM      Result Value Ref Range   Glucose-Capillary 244 (*) 70 - 99 mg/dL   Comment 1 Documented in Chart     Comment 2 Notify RN    GLUCOSE, CAPILLARY     Status: Abnormal   Collection Time    10/07/13  2:37 PM      Result Value Ref Range   Glucose-Capillary 216 (*) 70 - 99 mg/dL     RADIOLOGY RESULTS: See E-Chart or I-Site for most recent results.  Images and reports are reviewed.  Dg Chest 2 View  10/01/2013   CLINICAL DATA:  Abdominal discomfort, weakness  EXAM: CHEST  2 VIEW  COMPARISON:  05/22/2012  FINDINGS: Cardiomediastinal silhouette is stable. Again noted status post right mastectomy. Degenerative changes and levoscoliosis lumbar spine. Stable degenerative changes lower thoracic spine. Mild hyperinflation again noted. No acute infiltrate or pleural effusion. No  pulmonary edema.  IMPRESSION: No active disease. Status post right mastectomy. Lumbar levoscoliosis.   Electronically Signed   By: Lahoma Crocker M.D.   On: 10/01/2013  15:12   Ct Abdomen Pelvis W Contrast  10/03/2013   CLINICAL DATA:  Mid to lower abdominal pain  EXAM: CT ABDOMEN AND PELVIS WITH CONTRAST  TECHNIQUE: Multidetector CT imaging of the abdomen and pelvis was performed using the standard protocol following bolus administration of intravenous contrast.  CONTRAST:  174mL OMNIPAQUE IOHEXOL 300 MG/ML  SOLN  COMPARISON:  05/22/2013  FINDINGS: There is no pleural or pericardial effusion identified. There is diffuse intrahepatic bile duct dilatation. The common bile duct is markedly dilated measuring up to 2.5 cm, image 24/series 2. There is a mass within the head and uncinate process of the pancreas which measures 3.1 cm, image 25/ series 602. There is not only obstruction of the common bile duct, but the pancreatic duct is also obstructed and dilated. The mass appears to touch but does not encase the superior mesenteric artery, image 29/series 2. The mass also appears to abut the posterior aspect of the portal vein, image 27/series 2. The lumen of the portal vein and superior mesenteric arteries are not compromised. The celiac artery appears uninvolved the spleen is normal. The splenic vein is patent  The adrenal glands both appear normal. Normal appearance of both kidneys. The urinary bladder appears normal. A pessary device is identified within the dome of vagina. Next  Calcified atherosclerotic disease involves the abdominal aorta. There is no aneurysm. No upper abdominal adenopathy. There is no pelvic or inguinal adenopathy identified.  The stomach is normal. The small bowel loops have a normal appearance without obstruction. The appendix is visualized and appears normal. Normal appearance of the colon.  Review of the visualized bony structures is significant for scoliosis and multi level degenerative disc  disease. No aggressive lytic or sclerotic bone lesions.  IMPRESSION: 1. Mass within the head and uncinate process of the pancreas obstructs both the common bile duct and pancreatic duct. 2. No specific features identified to suggest metastatic disease.   Electronically Signed   By: Kerby Moors M.D.   On: 10/03/2013 13:49   Dg Ercp  10/04/2013   CLINICAL DATA:  Obstructive jaundice, ERCP with stent placement  EXAM: ERCP  TECHNIQUE: Multiple spot images obtained with the fluoroscopic device and submitted for interpretation post-procedure.  COMPARISON:  CT ABD/PELVIS W CM dated 10/03/2013; DG ABD 2 VIEWS dated 10/01/2013; CT ABD/PELV WO CM dated 05/22/2013  FINDINGS: Two spot intraoperative fluoroscopic images during ERCP are provided for review.  Initial image demonstrates an ERCP probe overlying the right upper quadrant with selective cannulation of the common bile duct. There is minimal opacification of the intrahepatic biliary system which appears moderate to severely dilated centrally as demonstrated on recently performed abdominal CT.  Subsequent image demonstrates deployment of a plastic internal biliary stent overlying the distal aspect of the common bile duct.  Post cholecystectomy.  Enteric contrast is seen within the colon.  IMPRESSION: ERCP with plastic biliary stent placement as above.  These images were submitted for radiologic interpretation only. Please see the procedural report for the amount of contrast and the fluoroscopy time utilized.   Electronically Signed   By: Sandi Mariscal M.D.   On: 10/04/2013 11:59   Dg Abd 2 Views  10/01/2013   CLINICAL DATA:  Abdominal pain  EXAM: ABDOMEN - 2 VIEW  COMPARISON:  CT ABD/PELV WO CM dated 05/22/2013; DG LUMBAR SPINE 1 VIEW dated 05/27/2012  FINDINGS: There is a paucity of bowel gas. A moderate to large amount of stool is appreciated. Severe levoscoliosis appreciated within the lumbar  spine. Atherosclerotic calcifications identified within the aorta.   IMPRESSION: Nonobstructive bowel gas pattern with large amount of stool.   Electronically Signed   By: Margaree Mackintosh M.D.   On: 10/01/2013 15:21      ASSESSMENT AND PLAN: No problem-specific assessment & plan notes found for this encounter.     Milus Height MD Surgical Oncology, [facility]      Visit Diagnoses: No diagnosis found.  Primary Care Physician: [name], MD

## 2013-10-14 NOTE — Patient Instructions (Signed)
I will order chest CT, genetics referral, and radiation oncology referral.   We will discuss you at our GI conference next Wednesday.    Follow up with me in 6-8 weeks depending on what is going on with chemo or other treatment.

## 2013-10-15 ENCOUNTER — Other Ambulatory Visit (INDEPENDENT_AMBULATORY_CARE_PROVIDER_SITE_OTHER): Payer: Self-pay | Admitting: General Surgery

## 2013-10-15 ENCOUNTER — Ambulatory Visit (INDEPENDENT_AMBULATORY_CARE_PROVIDER_SITE_OTHER): Payer: Medicare Other | Admitting: Internal Medicine

## 2013-10-15 ENCOUNTER — Encounter: Payer: Self-pay | Admitting: Internal Medicine

## 2013-10-15 VITALS — BP 110/68 | HR 88 | Temp 96.8°F | Resp 16 | Ht 61.0 in | Wt 113.0 lb

## 2013-10-15 DIAGNOSIS — E119 Type 2 diabetes mellitus without complications: Secondary | ICD-10-CM

## 2013-10-15 DIAGNOSIS — Z79899 Other long term (current) drug therapy: Secondary | ICD-10-CM

## 2013-10-15 DIAGNOSIS — C259 Malignant neoplasm of pancreas, unspecified: Secondary | ICD-10-CM

## 2013-10-15 LAB — HEPATIC FUNCTION PANEL
ALBUMIN: 3.9 g/dL (ref 3.5–5.2)
ALT: 116 U/L — ABNORMAL HIGH (ref 0–35)
AST: 30 U/L (ref 0–37)
Alkaline Phosphatase: 493 U/L — ABNORMAL HIGH (ref 39–117)
BILIRUBIN INDIRECT: 0.9 mg/dL (ref 0.2–1.2)
BILIRUBIN TOTAL: 1.5 mg/dL — AB (ref 0.2–1.2)
Bilirubin, Direct: 0.6 mg/dL — ABNORMAL HIGH (ref 0.0–0.3)
Total Protein: 6.3 g/dL (ref 6.0–8.3)

## 2013-10-15 LAB — BASIC METABOLIC PANEL WITH GFR
BUN: 15 mg/dL (ref 6–23)
CHLORIDE: 94 meq/L — AB (ref 96–112)
CO2: 26 mEq/L (ref 19–32)
Calcium: 9.7 mg/dL (ref 8.4–10.5)
Creat: 0.69 mg/dL (ref 0.50–1.10)
GFR, EST NON AFRICAN AMERICAN: 81 mL/min
GFR, Est African American: >89 mL/min
Glucose, Bld: 199 mg/dL — ABNORMAL HIGH (ref 70–99)
POTASSIUM: 4.3 meq/L (ref 3.5–5.3)
Sodium: 132 mEq/L — ABNORMAL LOW (ref 135–145)

## 2013-10-15 NOTE — Progress Notes (Signed)
   Subjective:    Patient ID: [name], female    DOB: 19-Dec-1927, 80 y.o.   MRN: [id number]  HPI  Patient newly Dx'd T2 NIDDM was hospitalized for successful biliary stenting of a newly found pancreatic carcinoma.Just evaluated by Dr Barry Dienes who is arranging oncology team approach of her particular circumstances.  Patient returns today for 1 wee F/U of initial Tx w Tradgenta and addition  the last week of Metformin which she has been taking 2-3 tablets daily. BID glucoses have ranged betw 140-240 mg% with a  1 week average of 191 mg% . Pt denies diabetic polys or hypoglycemic reactions.  Review of Systems  Neg 12 pt review   BP 110/68  Pulse 88  Temp(Src) 96.8 F (36 C) (Temporal)  Resp 16  Ht 5\' 1"  (1.549 m)  Wt 113 lb (51.256 kg)  BMI 21.36 kg/m2  Objective:   Physical Exam  HEENT - Eac's patent. TM's Nl.EOM's full. PERRLA. NasoOroPharynx clear. Neck - supple. Nl Thyroid. No bruits nodes JVD Chest - Clear equal BS Cor - Nl HS. RRR w/o sig MGR. PP 1(+) No edema. Abd - No palpable organomegaly, masses or tenderness. BS nl. MS- FROM. w/o deformities. Muscle power tone and bulk Nl. Gait Nl. Neuro - No obvious Cr N abnormalities. Sensory, motor and Cerebellar functions appear Nl w/o focal abnormalities.  Assessment & Plan:   1. T2 NIDDM, new- control improving.   2. Ca Pancreas-   Plan - Advised D/C Tradgenta and increase MF to 2 tabs bid - continue monitor bid CBG's and return in 2 weeks  Diabetic diet and teaching reviewed.  Check CBC and HFP to monitor LFT's and blood counts.

## 2013-10-15 NOTE — Patient Instructions (Signed)
Diabetes, Type 2, Am I At Risk?  Diabetes is a lasting (chronic) disease. In type 2 diabetes, the pancreas does not make enough insulin, and the body does not respond normally to the insulin that is made. This type of diabetes was also previously called adult onset diabetes. About 90% of all those who have diabetes have type 2. It usually occurs after the age of 40, but can occur at any age.   People develop type 2 diabetes because they do not use insulin properly. Eventually, the pancreas cannot make enough insulin for the body's needs. Over time, the amount of glucose (sugar) in the blood increases.  RISK FACTORS  · Overweight  the more weight you have, the more resistant your cells become to insulin.  · Family history  you are more likely to get diabetes if a parent or sibling has diabetes.  · Race certain races get diabetes more.  · African Americans.  · American Indians.  · Asian Americans.  · Hispanics.  · Pacific Islander.  · Inactive exercise helps control weight and helps your cells be more sensitive to insulin.  · Gestational diabetes  some women develop diabetes while they are pregnant. This goes away when they deliver. However, they are 50-60% more likely to develop type 2 diabetes at a later time.  · Having a baby over 9 pounds  a sign that you may have had gestational diabetes.  · Age the risk of diabetes goes up as you get older, especially after age 45.  · High blood pressure (hypertension).  SYMPTOMS  Many people have no signs or symptoms. Symptoms can be so mild that you might not even notice them. Some of these signs are:  · Increased thirst.  · Increased hunger.  · Tiredness (fatigue).  · Increased urination, especially at night.  · Weight loss.  · Blurred vision.  · Sores that do not heal.  WHO SHOULD BE TESTED?  · Anyone 45 years or older, especially if overweight, should consider getting tested.  · If you are younger than 45, overweight, and have one or more of the risk factors, you should  consider getting tested.  DIAGNOSIS  · Fasting blood glucose (FBS). Usually, 2 are done.  · FBS 101-125 mg/dl is considered pre-diabetes.  · FBS 126 mg/dl or greater is considered diabetes.  · 2 hour Oral Glucose Tolerance Test (OGTT). This test is preformed by first having you not eat or drink for several hours. You are then given something sweet to drink and your blood glucose is measured fasting, at one hour and 2 hours. This test tells how well you are able to handle sugars or carbohydrates.  · Fasting: 60-100 mg/dl.  · 1 hour: less than 200 mg/dl.  · 2 hours: less than 140 mg/dl.  · A1c A1c is a blood glucose test that gives and average of your blood glucose over 3 months. It is the accepted method to use to diagnose diabetes.  · A1c 5.7-6.4% is considered pre-diabetes.  · A1c 6.5% or greater is considered diabetes.  WHAT DOES IT MEAN TO HAVE PRE-DIABETES?  Pre-diabetes means you are at risk for getting type 2 diabetes. Your blood glucose is higher than normal, but not yet high enough to diagnose diabetes. The good news is, if you have pre-diabetes you can reduce the risk of getting diabetes and even return to normal blood glucose levels. With modest weight loss and moderate physical activity, you can delay or prevent type 2   diabetes.   PREVENTION  You cannot do anything about race, age or family history, but you can lower your chances of getting diabetes. You can:   · Exercise regularly and be active.  · Reduce fat and calorie intake.  · Make wise food choices as much as you can.  · Reduce your intake of salt and alcohol.  · Maintain a reasonable weight.  · Keep blood pressure in an acceptable range. Take medication if needed.  · Not smoke.  · Maintain an acceptable cholesterol level (HDL, LDL, Triglycerides). Take medication if needed.  DOING MY PART: GETTING STARTED  Making big changes in your life is hard, especially if you are faced with more than one change. You can make it easier by taking these  steps:  · Make a plan to change behavior.  · Decide exactly what you will do and when you will do it.  · Plan what you need to get ready.  · Think about what might prevent you from reaching your goals.  · Find family and friends who will support and encourage you.  · Decide how you will reward yourself when you do what you have planned.  · Your doctor, dietitian, or counselor can help you make a plan.  HERE ARE SOME OF THE AREAS YOU MAY WISH TO CHANGE TO REDUCE YOUR RISK OF DIABETES.  If you are overweight or obese, choose sensible ways to get in shape. Even small amounts of weight loss, like 5-10 pounds, can help reduce the effects of insulin resistance and help blood glucose control.  Diet  · Avoid crash diets. Instead, eat less of the foods you usually have. Limit the amount of fat you eat.  · Increase your physical activity. Aim for at least 30 minutes of exercise most days of the week.  · Set a reasonable weight-loss goal, such as losing 1 pound a week. Aim for a long-term goal of losing 5-7% of your total body weight.  · Make wise food choices most of the time.  · What you eat has a big impact on your health. By making wise food choices, you can help control your body weight, blood pressure, and cholesterol.  · Take a hard look at the serving sizes of the foods you eat. Reduce serving sizes of meat, desserts, and foods high in fat. Increase your intake of fruits and vegetables.  · Limit your fat intake to about 25% of your total calories. For example, if your food choices add up to about 2,000 calories a day, try to eat no more than 56 grams of fat. Your caregiver or a dietitian can help you figure out how much fat to have. You can check food labels for fat content too.  · You may also want to reduce the number of calories you have each day.  · Keep a food log. Write down what you eat, how much you eat, and anything else that helps keep you on track.  · When you meet your goal, reward yourself with a nonfood  item or activity.  Exercise  · Be physically active every day.  · Keep and exercise log. Write down what exercise you did, for how long, and anything else that keeps you on track.  · Regular exercise (like brisk walking) tackles several risk factors at once. It helps you lose weight, it keeps your cholesterol and blood pressure under control, and it helps your body use insulin. People who are physically active for 30   minutes a day, 5 days a week, reduced their risk of type 2 diabetes. If you are not very active, you should start slowly at first. Talk with your caregiver first about what kinds of exercise would be safe for you. Make a plan to increase your activity level with the goal of being active for at least 30 minutes a day, most days of the week.  · Choose activities you enjoy. Here are some ways to work extra activity into your daily routine:  · Take the stairs rather than an elevator or escalator.  · Park at the far end of the lot and walk.  · Get off the bus a few stops early and walk the rest of the way.  · Walk or bicycle instead of drive whenever you can.  Medications  Some people need medication to help control their blood pressure or cholesterol levels. If you do, take your medicines as directed. Ask your caregiver whether there are any medicines you can take to prevent type 2 diabetes.  Document Released: 07/27/2003 Document Revised: 10/16/2011 Document Reviewed: 04/21/2009  [employer]® Patient Information ©2014 [employer].

## 2013-10-15 NOTE — Assessment & Plan Note (Signed)
Pt has new diagnosis of pancreatic cancer.  She is at risk to have occult metastatic disease based on her CA 19-9. We will get staging chest CT.   She is 10, but reasonably healthy 80 year old.  I would like her to meet med onc and rad onc and discuss neoadjuvant chemoradiation.  I think she would still have a difficult time with a whipple due to fatigue and prolonged recovery even if she had no complications, but not in such a severe way that she is not a surgical candidate.  I described pancreaticoduodenectomy to patient and family, along with risks.  We discussed the overall prognosis of pancreatic cancer and risk of recurrence.  A period of chemoradiation would allow her time to help make decision about whether or not to have a major operation.    I am also referring her to genetics since she has had breast cancer, and has significant family history of breast cancer and prostate cancer.    45 min spent in evaluation, examination, counseling, and coordination of care.  >25 min spent in counseling.

## 2013-10-16 ENCOUNTER — Telehealth: Payer: Self-pay | Admitting: *Deleted

## 2013-10-16 ENCOUNTER — Ambulatory Visit (HOSPITAL_BASED_OUTPATIENT_CLINIC_OR_DEPARTMENT_OTHER): Payer: Medicare Other | Admitting: Internal Medicine

## 2013-10-16 ENCOUNTER — Encounter: Payer: Self-pay | Admitting: Internal Medicine

## 2013-10-16 ENCOUNTER — Ambulatory Visit: Payer: Medicare Other

## 2013-10-16 ENCOUNTER — Other Ambulatory Visit: Payer: Medicare Other

## 2013-10-16 VITALS — BP 129/63 | HR 108 | Temp 97.7°F | Resp 18 | Ht 61.0 in | Wt 114.0 lb

## 2013-10-16 DIAGNOSIS — Z853 Personal history of malignant neoplasm of breast: Secondary | ICD-10-CM

## 2013-10-16 DIAGNOSIS — C50919 Malignant neoplasm of unspecified site of unspecified female breast: Secondary | ICD-10-CM

## 2013-10-16 DIAGNOSIS — C259 Malignant neoplasm of pancreas, unspecified: Secondary | ICD-10-CM

## 2013-10-16 DIAGNOSIS — C250 Malignant neoplasm of head of pancreas: Secondary | ICD-10-CM

## 2013-10-16 NOTE — Patient Instructions (Signed)
Nanoparticle Albumin-Bound Paclitaxel injection What is this medicine? NANOPARTICLE ALBUMIN-BOUND PACLITAXEL (Na no PAHR ti kuhl al BYOO muhn-bound PAK li TAX el) is a chemotherapy drug. It targets fast dividing cells, like cancer cells, and causes these cells to die. This medicine is used to treat advanced breast cancer and advanced lung cancer. This medicine may be used for other purposes; ask your health care provider or pharmacist if you have questions. COMMON BRAND NAME(S): Abraxane What should I tell my health care provider before I take this medicine? They need to know if you have any of these conditions: -kidney disease -liver disease -low blood counts, like low platelets, red blood cells, or white blood cells -recent or ongoing radiation therapy -an unusual or allergic reaction to paclitaxel, albumin, other chemotherapy, other medicines, foods, dyes, or preservatives -pregnant or trying to get pregnant -breast-feeding How should I use this medicine? This drug is given as an infusion into a vein. It is administered in a hospital or clinic by a specially trained health care professional. Talk to your pediatrician regarding the use of this medicine in children. Special care may be needed. Overdosage: If you think you have taken too much of this medicine contact a poison control center or emergency room at once. NOTE: This medicine is only for you. Do not share this medicine with others. What if I miss a dose? It is important not to miss your dose. Call your doctor or health care professional if you are unable to keep an appointment. What may interact with this medicine? -cyclosporine -diazepam -ketoconazole -medicines to increase blood counts like filgrastim, pegfilgrastim, sargramostim -other chemotherapy drugs like cisplatin, doxorubicin, epirubicin, etoposide, teniposide, vincristine -quinidine -testosterone -vaccines -verapamil Talk to your doctor or health care professional  before taking any of these medicines: -acetaminophen -aspirin -ibuprofen -ketoprofen -naproxen This list may not describe all possible interactions. Give your health care provider a list of all the medicines, herbs, non-prescription drugs, or dietary supplements you use. Also tell them if you smoke, drink alcohol, or use illegal drugs. Some items may interact with your medicine. What should I watch for while using this medicine? Your condition will be monitored carefully while you are receiving this medicine. You will need important blood work done while you are taking this medicine. This drug may make you feel generally unwell. This is not uncommon, as chemotherapy can affect healthy cells as well as cancer cells. Report any side effects. Continue your course of treatment even though you feel ill unless your doctor tells you to stop. In some cases, you may be given additional medicines to help with side effects. Follow all directions for their use. Call your doctor or health care professional for advice if you get a fever, chills or sore throat, or other symptoms of a cold or flu. Do not treat yourself. This drug decreases your body's ability to fight infections. Try to avoid being around people who are sick. This medicine may increase your risk to bruise or bleed. Call your doctor or health care professional if you notice any unusual bleeding. Be careful brushing and flossing your teeth or using a toothpick because you may get an infection or bleed more easily. If you have any dental work done, tell your dentist you are receiving this medicine. Avoid taking products that contain aspirin, acetaminophen, ibuprofen, naproxen, or ketoprofen unless instructed by your doctor. These medicines may hide a fever. Do not become pregnant while taking this medicine. Women should inform their doctor if they wish  to become pregnant or think they might be pregnant. There is a potential for serious side effects to  an unborn child. Talk to your health care professional or pharmacist for more information. Do not breast-feed an infant while taking this medicine. Men are advised not to father a child while receiving this medicine. What side effects may I notice from receiving this medicine? Side effects that you should report to your doctor or health care professional as soon as possible: -allergic reactions like skin rash, itching or hives, swelling of the face, lips, or tongue -low blood counts - This drug may decrease the number of white blood cells, red blood cells and platelets. You may be at increased risk for infections and bleeding. -signs of infection - fever or chills, cough, sore throat, pain or difficulty passing urine -signs of decreased platelets or bleeding - bruising, pinpoint red spots on the skin, black, tarry stools, nosebleeds -signs of decreased red blood cells - unusually weak or tired, fainting spells, lightheadedness -breathing problems -changes in vision -chest pain -high or low blood pressure -mouth sores -nausea and vomiting -pain, swelling, redness or irritation at the injection site -pain, tingling, numbness in the hands or feet -slow or irregular heartbeat -swelling of the ankle, feet, hands Side effects that usually do not require medical attention (report to your doctor or health care professional if they continue or are bothersome): -aches, pains -changes in the color of fingernails -diarrhea -hair loss -loss of appetite This list may not describe all possible side effects. Call your doctor for medical advice about side effects. You may report side effects to FDA at 1-800-FDA-1088. Where should I keep my medicine? This drug is given in a hospital or clinic and will not be stored at home. NOTE: This sheet is a summary. It may not cover all possible information. If you have questions about this medicine, talk to your doctor, pharmacist, or health care provider.  2014,  Elsevier/Gold Standard. (2012-09-16 16:48:50) Gemcitabine injection What is this medicine? GEMCITABINE (jem SIT a been) is a chemotherapy drug. This medicine is used to treat many types of cancer like breast cancer, lung cancer, pancreatic cancer, and ovarian cancer. This medicine may be used for other purposes; ask your health care provider or pharmacist if you have questions. COMMON BRAND NAME(S): Gemzar What should I tell my health care provider before I take this medicine? They need to know if you have any of these conditions: -blood disorders -infection -kidney disease -liver disease -recent or ongoing radiation therapy -an unusual or allergic reaction to gemcitabine, other chemotherapy, other medicines, foods, dyes, or preservatives -pregnant or trying to get pregnant -breast-feeding How should I use this medicine? This drug is given as an infusion into a vein. It is administered in a hospital or clinic by a specially trained health care professional. Talk to your pediatrician regarding the use of this medicine in children. Special care may be needed. Overdosage: If you think you have taken too much of this medicine contact a poison control center or emergency room at once. NOTE: This medicine is only for you. Do not share this medicine with others. What if I miss a dose? It is important not to miss your dose. Call your doctor or health care professional if you are unable to keep an appointment. What may interact with this medicine? -medicines to increase blood counts like filgrastim, pegfilgrastim, sargramostim -some other chemotherapy drugs like cisplatin -vaccines Talk to your doctor or health care professional before taking any   of these medicines: -acetaminophen -aspirin -ibuprofen -ketoprofen -naproxen This list may not describe all possible interactions. Give your health care provider a list of all the medicines, herbs, non-prescription drugs, or dietary supplements you  use. Also tell them if you smoke, drink alcohol, or use illegal drugs. Some items may interact with your medicine. What should I watch for while using this medicine? Visit your doctor for checks on your progress. This drug may make you feel generally unwell. This is not uncommon, as chemotherapy can affect healthy cells as well as cancer cells. Report any side effects. Continue your course of treatment even though you feel ill unless your doctor tells you to stop. In some cases, you may be given additional medicines to help with side effects. Follow all directions for their use. Call your doctor or health care professional for advice if you get a fever, chills or sore throat, or other symptoms of a cold or flu. Do not treat yourself. This drug decreases your body's ability to fight infections. Try to avoid being around people who are sick. This medicine may increase your risk to bruise or bleed. Call your doctor or health care professional if you notice any unusual bleeding. Be careful brushing and flossing your teeth or using a toothpick because you may get an infection or bleed more easily. If you have any dental work done, tell your dentist you are receiving this medicine. Avoid taking products that contain aspirin, acetaminophen, ibuprofen, naproxen, or ketoprofen unless instructed by your doctor. These medicines may hide a fever. Women should inform their doctor if they wish to become pregnant or think they might be pregnant. There is a potential for serious side effects to an unborn child. Talk to your health care professional or pharmacist for more information. Do not breast-feed an infant while taking this medicine. What side effects may I notice from receiving this medicine? Side effects that you should report to your doctor or health care professional as soon as possible: -allergic reactions like skin rash, itching or hives, swelling of the face, lips, or tongue -low blood counts - this  medicine may decrease the number of white blood cells, red blood cells and platelets. You may be at increased risk for infections and bleeding. -signs of infection - fever or chills, cough, sore throat, pain or difficulty passing urine -signs of decreased platelets or bleeding - bruising, pinpoint red spots on the skin, black, tarry stools, blood in the urine -signs of decreased red blood cells - unusually weak or tired, fainting spells, lightheadedness -breathing problems -chest pain -mouth sores -nausea and vomiting -pain, swelling, redness at site where injected -pain, tingling, numbness in the hands or feet -stomach pain -swelling of ankles, feet, hands -unusual bleeding Side effects that usually do not require medical attention (report to your doctor or health care professional if they continue or are bothersome): -constipation -diarrhea -hair loss -loss of appetite -stomach upset This list may not describe all possible side effects. Call your doctor for medical advice about side effects. You may report side effects to FDA at 1-800-FDA-1088. Where should I keep my medicine? This drug is given in a hospital or clinic and will not be stored at home. NOTE: This sheet is a summary. It may not cover all possible information. If you have questions about this medicine, talk to your doctor, pharmacist, or health care provider.  2014, Elsevier/Gold Standard. (2007-12-03 18:45:54) CA 19-9 (Cancer Antigen 19-9) This is a test to help differentiate between cancer of  the pancreas and bile ducts and other conditions. It is also used to monitor response to pancreatic cancer treatment and to watch for recurrence. Cancer antigen 19-9 (CA 19-9) is a protein that exists on the surface of certain cells. CA 19-9 does not cause cancer; rather, it is a protein that is shed by the tumor cells, making it useful as a tumor marker to follow the course of the cancer.  CA 19-9 is elevated in most patients with  advanced pancreatic cancer, but it may also be elevated in other cancers, conditions, and diseases such as colorectal cancer, lung cancer, gallbladder cancer, gall stones, pancreatitis, cystic fibrosis, and liver disease. Very small amounts of CA 19-9 may also be found in healthy patients. It is not useful as a screening test for cancer because it is not sensitive or specific enough. to be used as a screen for people who do not have symptoms. There are too many false positives and false negatives associated with it. Early pancreatic cancer gives few warnings. By the time a patient has symptoms and significantly elevated levels of CA 19-9, their pancreatic cancer is usually at an advanced stage. PREPARATION FOR TEST A blood sample is obtained by inserting a needle into a vein in the arm. NORMAL FINDINGS Adults: 0-37 units/mL (0-37 kunits/L) Ranges for normal findings may vary among different laboratories and hospitals. You should always check with your doctor after having lab work or other tests done to discuss the meaning of your test results and whether your values are considered within normal limits. MEANING OF TEST  Your caregiver will go over the test results with you and discuss the importance and meaning of your results, as well as treatment options and the need for additional tests if necessary. OBTAINING THE TEST RESULTS It is your responsibility to obtain your test results. Ask the lab or department performing the test when and how you will get your results. Document Released: 08/15/2004 Document Revised: 10/16/2011 Document Reviewed: 07/01/2008 [facility] Patient Information 2014 [place], [place]. Pancreatic Cancer  Pancreatic cancer is an abnormal growth of tissue (tumor) in the pancreas that is cancerous (malignant). Unlike noncancerous (benign) tumors, malignant tumors can spread to other parts of your body. The pancreas is a gland located deep in the abdomen, between the stomach and the  spine. The pancreas makes insulin and other hormones. These hormones help the body use or store the energy that comes from food. The pancreas also makes pancreatic juices. These juices contain enzymes that help digest food. Most pancreatic cancers begin in the ducts that carry pancreatic juices. When cancer of the pancreas spreads (metastasizes) outside the pancreas, cancer cells are often found in nearby lymph nodes. If the cancer has reached these nodes, it means that cancer cells may have spread to other lymph nodes or other tissues, such as the liver or lungs. Sometimes cancer of the pancreas spreads to the peritoneum. This is the tissue that lines the abdomen. CAUSES  The exact cause of pancreatic cancer is unknown.  RISK FACTORS There are a number of risk factors that can increase your chances of getting pancreatic cancer. They include:  Age. The likelihood of developing pancreatic cancer increases with age. Most pancreatic cancers occur in people older than 60 years.  Smoking. Cigarette smokers are 2 to 3 times more likely than nonsmokers to develop pancreatic cancer.  Diabetes, especially if you were diagnosed as an adult.  Being female.  Being African American.  Family history. The risk for developing pancreatic  cancer triples if a person's mother, father, sister, or brother had the disease. Also, a family history of colon cancer or ovarian cancer increases the risk of pancreatic cancer.  Chronic pancreatitis. Chronic pancreatitis is a painful condition of the pancreas.  Exposure to certain chemicals in the workplace.  Being obese or eating a diet high in fat and red meat. SYMPTOMS  Pancreatic cancer is sometimes called a "silent disease." This is because early pancreatic cancer often does not cause symptoms. As the cancer grows, symptoms may include:  Weakness.  Abdominal pain.  Diarrhea.  Depression.  Loss of appetite.  Indigestion.  Pain in the upper abdomen or upper  back.  Nausea and vomiting.  Yellowing of the skin or eyes (jaundice).  Back pain.  Weight loss.  Fatigue.  Clay-colored stools.  Unexplained blood clots.  Dark urine. DIAGNOSIS  Your caregiver will ask about your medical history. He or she may also perform a number of procedures, such as:  A physical exam. Your skin and eyes will be examined for signs of jaundice. The abdomen will be checked for changes in the area near the pancreas, liver, and gallbladder. Your caregiver will also check for an abnormal buildup of fluid in the abdomen (ascites).  Lab tests. Your caregiver may take blood and urine samples to check for bilirubin and other substances. Bilirubin is a substance that passes from the liver to the intestine through the gallbladder and bile duct. If the bile duct is blocked by a tumor, the bilirubin cannot pass through normally. Blockage of the bile duct may cause the level of bilirubin in the blood and urine to become very high.  Computed tomography (CT). An X-ray machine linked to a computer takes a series of detailed pictures of the pancreas and other organs and blood vessels in the abdomen.  Ultrasonography. The ultrasound device uses sound waves to produce pictures of the pancreas and other organs inside the abdomen.  Endoscopic retrograde cholangiopancreatography. A lighted tube (endoscope) is passed through the patient's mouth and stomach, down into the small intestine. Then a smaller tube (catheter) is inserted through the endoscope into the bile ducts and pancreatic ducts. A dye is injected through the catheter into the ducts and X-rays are taken. The X-rays can show whether the ducts are narrowed or blocked by a tumor.  Endoscopic ultrasonography. An endoscope is passed through the patient's mouth and stomach, down into the small intestine. An ultrasound device is placed down the endoscope to produce pictures of the area, including the pancreas.  Percutaneous  transhepatic cholangiography. A dye is injected through a thin needle into the liver and X-rays are taken. Unless there is a blockage, the dye should move freely through the bile ducts. From the X-rays, your caregiver can tell whether there is a blockage from a tumor.  Taking a tissue sample (biopsy) from the pancreas. The sample is examined under a microscope to look for cancer cells. Your cancer will be staged according to its severity and extent. Staging is a careful attempt to categorize your cancer to help determine which treatment will be most appropriate. Factors involved in staging include the size of the tumor, whether the cancer has spread, and if so, to what parts of the body it has spread. You may need to have more tests to determine the stage of your cancer. The test results will help determine what treatment plan is best for you. STAGES   Stage I. The cancer is only found in the pancreas.  Stage II. The cancer has spread to nearby tissues and possibly to the lymph nodes, but not to the blood vessels.  Stage III. The cancer is surrounding the major blood vessels beside the pancreas and has possibly spread to the lymph nodes.  Stage IV. The cancer has spread to other parts of the body, such as the liver, lungs, or peritoneum. TREATMENT  Treatment generally begins within several weeks after the diagnosis. There will be time to talk with your caregiver about treatment choices, get a second opinion, and learn more about the disease. Your caregiver may refer you to a cancer specialist (oncologist).  Cancer of the pancreas is very hard to control with current treatments. For that reason, many caregivers encourage patients with this disease to consider taking part in a clinical trial. Clinical trials are an important option for people with all stages of pancreatic cancer.  At this time, pancreatic cancer can be cured only when it is found at an early stage, before it has spread. However,  other treatments may be able to control the disease and help patients live longer and feel better. When a cure or control of the disease is not possible, some patients choose palliative therapy. Palliative therapy aims to improve quality of life by controlling pain and other problems caused by this disease, but it does not cure the disease. Depending on the type and stage, pancreatic cancer may be treated with surgery, radiation therapy, or chemotherapy. Some patients have a combination of these therapies.  Surgery may be done to remove all or part of the pancreas. Sometimes the cancer cannot be completely removed. However, if the tumor is blocking the common bile duct or duodenum, the surgeon can place a mesh tube (stent) in the blocked area. This helps keep the duct or duodenum open.  Radiation therapy uses high-energy rays to kill cancer cells.  Chemotherapy is the use of drugs to kill cancer cells. Caregivers also give chemotherapy to help reduce pain and other problems caused by pancreatic cancer. HOME CARE INSTRUCTIONS   Only take over-the-counter or prescription medicines for pain, discomfort, or fever as directed by your caregiver.  Maintain a healthy diet.  Consider joining a support group. This may help you learn to cope with the stress of having pancreatic cancer.  Seek advice to help you manage treatment side effects.  Keep all follow-up appointments as directed by your caregiver. SEEK IMMEDIATE MEDICAL CARE IF:   You have a sudden increase in pain.  Your skin or eyes turn more yellow.  You lose weight without trying.  You have a fever, especially during chemotherapy treatment or after stent placement.  You notice new fatigue or weakness. Document Released: 07/08/2004 Document Revised: 10/16/2011 Document Reviewed: 08/18/2011 [facility] Patient Information 2014 [place], [place].

## 2013-10-16 NOTE — Telephone Encounter (Signed)
Called and confirmed 10/28/13 genetic appt w/ pt.  Unable to schedule appt - emailed Carmelina Dane to enter it for me.  Emailed Barnett Applebaum D to make her aware.  Emailed Stevie at [employer] to make her aware.

## 2013-10-16 NOTE — Telephone Encounter (Signed)
appts made and printed...td 

## 2013-10-16 NOTE — Progress Notes (Signed)
Checked in new pt with no financial concerns. °

## 2013-10-17 ENCOUNTER — Encounter: Payer: Self-pay | Admitting: Radiation Oncology

## 2013-10-17 NOTE — Progress Notes (Signed)
GI Location of Tumor / Histology:Pancreas  Patient presented  months ago with symptoms of: feeling bad in January,diarrhea,and pale stool, loss weight 5-10 lbs,  had biliary stenting and ERCP 10/07/13   Biopsies of  (if applicable) revealed: Diagnosis 10/07/13:  FINE NEEDLE ASPIRATION, ENDOSCOPIC, PANCREAS HEAD(SPECIMEN 1 OF 1 COLLECTED 10/07/13): Dr. Carol Ada . MALIGNANT CELLS CONSISTENT WITH ADENOCARCINOMA.  Past/Anticipated interventions by surgeon, if any: ERCP 10/06/11; , 10/07/11 Sphincterotomy , Dr. Silvano Rusk, Cholecystectomy 10/07/11, Dr. Excell Seltzer,   Past/Anticipated interventions by medical oncology, if any:  adjuvant Adriamycin/Cytoxin Chemotherapy, , (Tamoxifen 5 years through January 2007) Dr.Livesay Weight changes, if any:   Bowel/Bladder complaints, if any: no  Nausea / Vomiting, if any: no  Pain issues, if any:  no  SAFETY ISSUES:  Prior radiation? Yes,  06/25/2000-09/17/2000, right breast,Dr.Goodchild 6100 cGy 33 fx  Pacemaker/ICD? no  Possible current pregnancy? no  Is the patient on methotrexate? no  Current Complaints / other details:  Newly dx DM also in January, mastectomy 13 years ago, Widowed, Mother breast ca/stroke, heart disease,; father stroke, heart disease, MI,; daughter breast cancer, sister cerebral aneurysm,  Former smoker, quit 50 years ago,  occasional wine

## 2013-10-19 NOTE — Progress Notes (Signed)
[facility] [phone]   [phone]) [phone]  NEW PATIENT EVALUATION   Name: [name] Date: 10/19/2013 MRN: [id number] DOB: [birth year]  PCP: [name], MD   REFERRING PHYSICIAN: Unk Pinto, MD  REASON FOR REFERRAL: Pancreatic Adenocarcinoma    Pancreatic cancer   10/03/2013 Imaging CT of abdomen: Mass within the head and uncinate process of the pancreas obstructs both the common bile duct and pancreatic duct.  No evidence of mets   10/03/2013 - 10/05/2013 Hospital Admission Admitted to hospitalist team at [facility] hospital due to bstructive jaundice and transaminitis secondary to pancreatic head mass noted on outpatient CT abdomen and pelvis. GI consulted.    10/04/2013 Procedure ERCP (Dr. Benson Norway) performed.  Malignant CBD obstruction with brushings obtained. EUS planned.    10/04/2013 Pathology Diagnosis BILE DUCT BRUSHING, COMMON (SPECIMEN 1 OF 1 COLLECTED 10-04-2013)  ATYPICAL CELLS, HIGHLY SUSPICIOUS FOR MALIGNANCY.   10/04/2013 Tumor Marker CA 19 9, 640.8   10/07/2013 Procedure EUS (Dr. Benson Norway) performed revealing a 2.9 x 3.3 cm pancreatic head mass without portal vein invasion, dilated CBC to 1.5 cm with plastic stent, dilated PD to 1 cm. FNA obtained.   10/07/2013 Pathology FINE NEEDLE ASPIRATION, ENDOSCOPIC, PANCREAS HEAD(SPECIMEN 1 OF 1 COLLECTED 10/07/13): MALIGNANT CELLS CONSISTENT WITH ADENOCARCINOMA.   10/07/2013 Initial Diagnosis Pancreatic cancer   10/14/2013 Cancer Staging Seen by Dr. Barry Dienes who recommends chemoradiation. Referred to oncology and radiation oncology. CT of chest ordered to complete staging.    10/14/2013 Family History referring her to genetics since she has had breast cancer, and has significant family history of breast cancer and prostate cancer     HISTORY OF PRESENT ILLNESS:[name] is a 80 y.o. female with a history of breast cancer s/p mastectomy of right breast (who is being referred by Dr. Barry Dienes of Surgical oncology  for further evaluation of her newly diagnosed pancreatic adenocarcinoma. Today, she is accompanied by her family including her daughter-in-law [name] and her son [name] and a friend [name].   She reports during the beginning of January feeling bad with diarrhea and pale stools.  She was placed on an antidiarrheal medications and had lab work which revealed transaminatis and elevated pancreatic enzymes.  She notes a 5 lb weight lost from 120 lb to 114 lb presently  She had recent back and knee surgeries which resulted in prior weight loss.  She reports bloatiness and abdominal discomfort during her initial presentation.  Oncology history is as noted above.    Since discharge, she reports resolution of her abdominal pain and increase in energy.  Her appetite has also improved.   She was evaluated by Dr. Barry Dienes for consideration of surgery for her localized pancreatic cancer (CT of chest has been ordered) and recommended chemoradiation while the patient determines if she wants to proceed given the extent of the surgery required.  Patient was driving a few months ago and remains independent of her basic, intermediate and advanced activities of daily of living.  She has a notable family history of cancer.  Her daughter had breast cancer at age 3.  Her sister passed away from breast cancer with metastatic disease to the bone.    PAST MEDICAL HISTORY:  has a past medical history of Arthritis; GERD (gastroesophageal reflux disease); Complication of anesthesia; Dizziness; Seasonal allergies; Frequency of urination; Dysrhythmia; Hypertension; Hyperlipidemia; Thyroid disease; Hypothyroidism; Breast cancer (2001); and Cancer (10/07/13).     PAST SURGICAL HISTORY: Past Surgical History  Procedure Laterality Date  .  Joint replacement      right knee  . Carpal tunnel release      bilateral  . Incontinence surgery    . Cataract extraction      left  . Mastectomy      right due to cancer  . Ercp  10/06/2011     Procedure: ENDOSCOPIC RETROGRADE CHOLANGIOPANCREATOGRAPHY (ERCP);  Surgeon: Gatha Mayer, MD;  Location: [facility];  Service: Endoscopy;  Laterality: N/A;  or general per anesthesia  . Sphincterotomy  10/06/2011    Procedure: SPHINCTEROTOMY;  Surgeon: Gatha Mayer, MD;  Location: [facility];  Service: Endoscopy;;  . Cholecystectomy  10/07/2011    Procedure: LAPAROSCOPIC CHOLECYSTECTOMY WITH INTRAOPERATIVE CHOLANGIOGRAM;  Surgeon: Edward Jolly, MD;  Location: [facility];  Service: General;  Laterality: N/A;  . Tonsillectomy    . Colonoscopy    . Eye surgery    . Lumbar laminectomy/decompression microdiscectomy  05/27/2012    Procedure: LUMBAR LAMINECTOMY/DECOMPRESSION MICRODISCECTOMY 1 LEVEL;  Surgeon: Ophelia Charter, MD;  Location: [facility];  Service: Neurosurgery;  Laterality: Bilateral;  Lumbar four laminectomy, with bilateral lumbar three laminotomies.  . Ercp N/A 10/04/2013    Procedure: ENDOSCOPIC RETROGRADE CHOLANGIOPANCREATOGRAPHY (ERCP);  Surgeon: Beryle Beams, MD;  Location: [facility];  Service: Gastroenterology;  Laterality: N/A;  . Eus N/A 10/07/2013    Procedure: UPPER ENDOSCOPIC ULTRASOUND (EUS) LINEAR;  Surgeon: Beryle Beams, MD;  Location: [facility];  Service: Endoscopy;  Laterality: N/A;     CURRENT MEDICATIONS: has a current medication list which includes the following prescription(s): blood glucose monitor kit, vitamin d-3, estradiol, flaxseed (linseed), hydrochlorothiazide, levothyroxine, magnesium, metformin, metoprolol succinate, fish oil, omeprazole, sucralfate, trueplus lancets 33g, and tradjenta.   ALLERGIES: Aspirin; Gemfibrozil; Nsaids; Pravastatin; and Pse hcl-dm hbr-guaifenesin tan   SOCIAL HISTORY:  reports that she quit smoking about 56 years ago. She has never used smokeless tobacco. She reports that she drinks alcohol. She reports that she does not use illicit drugs.   FAMILY HISTORY: family history includes Breast cancer in her daughter  and mother; Cerebral aneurysm in her sister; Heart attack in her father; Heart disease in her father and mother; Hyperlipidemia in her son; Hypertension in her son; Stroke in her father and mother.   LABORATORY DATA:  CBC    Component Value Date/Time   WBC 9.3 10/03/2013 1605   WBC 7.4 07/24/2007 1140   RBC 4.08 10/03/2013 1605   RBC 4.03 07/24/2007 1140   HGB 12.7 10/03/2013 1605   HGB 13.0 07/24/2007 1140   HCT 36.2 10/03/2013 1605   HCT 37.8 07/24/2007 1140   PLT 266 10/03/2013 1605   PLT 223 07/24/2007 1140   MCV 88.7 10/03/2013 1605   MCV 93.7 07/24/2007 1140   MCH 31.1 10/03/2013 1605   MCH 32.2 07/24/2007 1140   MCHC 35.1 10/03/2013 1605   MCHC 34.3 07/24/2007 1140   RDW 15.4 10/03/2013 1605   RDW 13.7 07/24/2007 1140   LYMPHSABS 2.1 10/03/2013 1605   LYMPHSABS 1.8 07/24/2007 1140   MONOABS 1.0 10/03/2013 1605   MONOABS 0.6 07/24/2007 1140   EOSABS 0.3 10/03/2013 1605   EOSABS 0.1 07/24/2007 1140   BASOSABS 0.1 10/03/2013 1605   BASOSABS 0.1 07/24/2007 1140    CMP     Component Value Date/Time   NA 132* 10/15/2013 1313   K 4.3 10/15/2013 1313   CL 94* 10/15/2013 1313   CO2 26 10/15/2013 1313   GLUCOSE 199* 10/15/2013 1313   BUN 15 10/15/2013  1313   CREATININE 0.69 10/15/2013 1313   CREATININE 0.73 10/05/2013 0423   CALCIUM 9.7 10/15/2013 1313   PROT 6.3 10/15/2013 1313   ALBUMIN 3.9 10/15/2013 1313   AST 30 10/15/2013 1313   ALT 116* 10/15/2013 1313   ALKPHOS 493* 10/15/2013 1313   BILITOT 1.5* 10/15/2013 1313   GFRNONAA 77* 10/05/2013 0423   GFRAA 89* 10/05/2013 0423    Results for KAAREN, NASS (MRN [id number]) as of 10/19/2013 13:25  Ref. Range 10/04/2013 04:22  CA 19-9 Latest Range: <35.0 U/mL 640.6 (H)    RADIOGRAPHY: Dg Chest 2 View  10/01/2013   CLINICAL DATA:  Abdominal discomfort, weakness  EXAM: CHEST  2 VIEW  COMPARISON:  05/22/2012  FINDINGS: Cardiomediastinal silhouette is stable. Again noted status post right mastectomy. Degenerative changes and levoscoliosis  lumbar spine. Stable degenerative changes lower thoracic spine. Mild hyperinflation again noted. No acute infiltrate or pleural effusion. No pulmonary edema.  IMPRESSION: No active disease. Status post right mastectomy. Lumbar levoscoliosis.   Electronically Signed   By: Lahoma Crocker M.D.   On: 10/01/2013 15:12   Ct Abdomen Pelvis W Contrast  10/03/2013   CLINICAL DATA:  Mid to lower abdominal pain  EXAM: CT ABDOMEN AND PELVIS WITH CONTRAST  TECHNIQUE: Multidetector CT imaging of the abdomen and pelvis was performed using the standard protocol following bolus administration of intravenous contrast.  CONTRAST:  185mL OMNIPAQUE IOHEXOL 300 MG/ML  SOLN  COMPARISON:  05/22/2013  FINDINGS: There is no pleural or pericardial effusion identified. There is diffuse intrahepatic bile duct dilatation. The common bile duct is markedly dilated measuring up to 2.5 cm, image 24/series 2. There is a mass within the head and uncinate process of the pancreas which measures 3.1 cm, image 25/ series 602. There is not only obstruction of the common bile duct, but the pancreatic duct is also obstructed and dilated. The mass appears to touch but does not encase the superior mesenteric artery, image 29/series 2. The mass also appears to abut the posterior aspect of the portal vein, image 27/series 2. The lumen of the portal vein and superior mesenteric arteries are not compromised. The celiac artery appears uninvolved the spleen is normal. The splenic vein is patent  The adrenal glands both appear normal. Normal appearance of both kidneys. The urinary bladder appears normal. A pessary device is identified within the dome of vagina. Next  Calcified atherosclerotic disease involves the abdominal aorta. There is no aneurysm. No upper abdominal adenopathy. There is no pelvic or inguinal adenopathy identified.  The stomach is normal. The small bowel loops have a normal appearance without obstruction. The appendix is visualized and appears  normal. Normal appearance of the colon.  Review of the visualized bony structures is significant for scoliosis and multi level degenerative disc disease. No aggressive lytic or sclerotic bone lesions.  IMPRESSION: 1. Mass within the head and uncinate process of the pancreas obstructs both the common bile duct and pancreatic duct. 2. No specific features identified to suggest metastatic disease.   Electronically Signed   By: Kerby Moors M.D.   On: 10/03/2013 13:49   Dg Ercp  10/04/2013   CLINICAL DATA:  Obstructive jaundice, ERCP with stent placement  EXAM: ERCP  TECHNIQUE: Multiple spot images obtained with the fluoroscopic device and submitted for interpretation post-procedure.  COMPARISON:  CT ABD/PELVIS W CM dated 10/03/2013; DG ABD 2 VIEWS dated 10/01/2013; CT ABD/PELV WO CM dated 05/22/2013  FINDINGS: Two spot intraoperative fluoroscopic images during ERCP are provided  for review.  Initial image demonstrates an ERCP probe overlying the right upper quadrant with selective cannulation of the common bile duct. There is minimal opacification of the intrahepatic biliary system which appears moderate to severely dilated centrally as demonstrated on recently performed abdominal CT.  Subsequent image demonstrates deployment of a plastic internal biliary stent overlying the distal aspect of the common bile duct.  Post cholecystectomy.  Enteric contrast is seen within the colon.  IMPRESSION: ERCP with plastic biliary stent placement as above.  These images were submitted for radiologic interpretation only. Please see the procedural report for the amount of contrast and the fluoroscopy time utilized.   Electronically Signed   By: Sandi Mariscal M.D.   On: 10/04/2013 11:59   Dg Abd 2 Views  10/01/2013   CLINICAL DATA:  Abdominal pain  EXAM: ABDOMEN - 2 VIEW  COMPARISON:  CT ABD/PELV WO CM dated 05/22/2013; DG LUMBAR SPINE 1 VIEW dated 05/27/2012  FINDINGS: There is a paucity of bowel gas. A moderate to large amount of  stool is appreciated. Severe levoscoliosis appreciated within the lumbar spine. Atherosclerotic calcifications identified within the aorta.  IMPRESSION: Nonobstructive bowel gas pattern with large amount of stool.   Electronically Signed   By: Margaree Mackintosh M.D.   On: 10/01/2013 15:21   PATHOLOGY:  Diagnosis FINE NEEDLE ASPIRATION, ENDOSCOPIC, PANCREAS HEAD(SPECIMEN 1 OF 1 COLLECTED 10/07/13): MALIGNANT CELLS CONSISTENT WITH ADENOCARCINOMA.  REVIEW OF SYSTEMS:  Constitutional: Denies fevers, chills, weight loss as reported above Eyes: Denies blurriness of vision Ears, nose, mouth, throat, and face: Denies mucositis or sore throat Respiratory: Denies cough, dyspnea or wheezes Cardiovascular: Denies palpitation, chest discomfort or lower extremity swelling Gastrointestinal:  Denies nausea, heartburn or change in bowel habits Skin: Denies abnormal skin rashes Lymphatics: Denies new lymphadenopathy or easy bruising Neurological:Denies numbness, tingling or new weaknesses Behavioral/Psych: Mood is stable, no new changes  All other systems were reviewed with the patient and are negative.  PHYSICAL EXAM:  height is $RemoveB'5\' 1"'sqrCsDmR$  (1.549 m) and weight is 114 lb (51.71 kg). Her oral temperature is 97.7 F (36.5 C). Her blood pressure is 129/63 and her pulse is 108. Her respiration is 18 and oxygen saturation is 97%.    GENERAL:alert, no distress and comfortable; easily gets to the exam table.  Ambulates with cane secondary to right knee prior surgery.  SKIN: skin color, texture, turgor are normal, no rashes or significant lesions EYES: normal, Conjunctiva are pink and non-injected, sclera clear OROPHARYNX:no exudate, no erythema and lips, buccal mucosa, and tongue normal  NECK: supple, thyroid normal size, non-tender, without nodularity LYMPH:  no palpable lymphadenopathy in the cervical, axillary or inguinal LUNGS: clear to auscultation and percussion with normal breathing effort HEART: regular rate &  rhythm and no murmurs and no lower extremity edema ABDOMEN:abdomen soft, non-tender and normal bowel sounds Musculoskeletal:no cyanosis of digits and no clubbing  NEURO: alert & oriented x 3 with fluent speech, no focal motor/sensory deficits   IMPRESSION: [name] is a 80 y.o. female with a history of    PLAN: 1.  Pancreatic Adenocarcinoma, newly diagnosed.  --We reviewed extensively her pathology and CT of abdomen as noted above consistent with localized pancreatic adenocarcinoma.  Her CT of chest is pending for further evaluation of extent of her disease and staging.  Her CA 19 9 is elevated 640.6.  Her functional status is relatively good with adequate organ function.   She would make a good candidate for chemoradiation.   We  then explained options for chemotherapy including gemcitabine alone or gemcitabine plus albumin-bound paclitaxel (Abraxane).  We explained that with chemoradiation approaches, chemotherapy is used as a radiosensitizer, increasing the toxicity of radiation to the pancreatic cancer, most commonly used in the adjuvant setting.  The role of neoadjuvant regimens is less defined based on randomized trials.    --Based on her age,  we will consider RT (50.4 Gy in 28 fractions) plus Gemcitabine (600 mg/m2 q weekly x 6) followed by 5 cycles of Gemcitabine alone (1,000 mg/m2 weekly x 3 every 4 weeks).  Given her age, doses may require modification. The reference is Loehrer, P.J., JCO, 2008, Vol 26, No 15S (May 20 Supplement, 2008), 4506.   Gemcitabine plus involved field RT was associated with an improved overall survival compared to gemcitabine alone for localized, unresectable pancreatic cancer.  She has a follow up with Dr. Kyung Rudd for consideration of XRT.  We will discuss with his office regarding recommendation for concurrent chemoradiation or sequential chemotherapy followed by XRT.   Other references for Gem-RT include Tahahashi H, 2013 (Annals of Surgery, December 2013,  Volume 258,Issue 6, pages 1040 -1050) which demonstrated a 5-year of 34 percent.   Patient will consider chemoradiation as she was less inclined to proceed with surgery given her age.   2. Family history of cancers. --Given extensive breast cancer and other cancer in the family, a referral for genetic counseling has been made.   3. Follow-up. --Patient will follow up with Dr. Ida Rogue office on 03/18, CT of Chest to complete staging on 03/13 and follow up with my office in 1-2 weeks to discuss how to proceed.   All questions were answered. The patient knows to call the clinic with any problems, questions or concerns. We can certainly see the patient much sooner if necessary.  I spent 40 minutes counseling the patient face to face. The total time spent in the appointment was 60 minutes.    Davanna He, MD 10/19/2013 2:09 PM

## 2013-10-19 NOTE — Progress Notes (Signed)
An additional reference would be Alison Murray, et al. Int J Radiation Oncology Bio Phys, 2010 Au 1; 77 (5): 1426-32.  Radiotherapy was delivered to a total dose of 50 Gy.  Concurrent chemotherapy with twice-weekly gemcitabine administered during the 5 weeks of radiotherapy, with a maximum dose of 70 mg/m(2).

## 2013-10-20 ENCOUNTER — Ambulatory Visit
Admission: RE | Admit: 2013-10-20 | Discharge: 2013-10-20 | Disposition: A | Discharge status: Home / Self Care | Payer: Medicare Other | Source: Ambulatory Visit | Attending: General Surgery | Admitting: General Surgery

## 2013-10-20 DIAGNOSIS — C259 Malignant neoplasm of pancreas, unspecified: Secondary | ICD-10-CM

## 2013-10-20 MED ORDER — IOHEXOL 300 MG/ML  SOLN
75.0000 mL | Freq: Once | INTRAMUSCULAR | Status: AC | PRN
  Administered 2013-10-20: 75 mL via INTRAVENOUS

## 2013-10-22 ENCOUNTER — Ambulatory Visit
Admission: RE | Admit: 2013-10-22 | Discharge: 2013-10-22 | Disposition: A | Discharge status: Home / Self Care | Payer: Medicare Other | Source: Ambulatory Visit | Attending: Radiation Oncology | Admitting: Radiation Oncology

## 2013-10-22 ENCOUNTER — Encounter: Payer: Self-pay | Admitting: Radiation Oncology

## 2013-10-22 VITALS — BP 126/55 | HR 94 | Temp 97.5°F | Resp 16 | Ht 61.0 in | Wt 112.5 lb

## 2013-10-22 DIAGNOSIS — C259 Malignant neoplasm of pancreas, unspecified: Secondary | ICD-10-CM

## 2013-10-22 DIAGNOSIS — C50919 Malignant neoplasm of unspecified site of unspecified female breast: Secondary | ICD-10-CM

## 2013-10-22 DIAGNOSIS — Z96659 Presence of unspecified artificial knee joint: Secondary | ICD-10-CM | POA: Insufficient documentation

## 2013-10-22 DIAGNOSIS — I10 Essential (primary) hypertension: Secondary | ICD-10-CM | POA: Insufficient documentation

## 2013-10-22 DIAGNOSIS — E785 Hyperlipidemia, unspecified: Secondary | ICD-10-CM | POA: Insufficient documentation

## 2013-10-22 DIAGNOSIS — Z87891 Personal history of nicotine dependence: Secondary | ICD-10-CM | POA: Insufficient documentation

## 2013-10-22 DIAGNOSIS — C250 Malignant neoplasm of head of pancreas: Secondary | ICD-10-CM | POA: Insufficient documentation

## 2013-10-22 DIAGNOSIS — E039 Hypothyroidism, unspecified: Secondary | ICD-10-CM | POA: Insufficient documentation

## 2013-10-22 DIAGNOSIS — K219 Gastro-esophageal reflux disease without esophagitis: Secondary | ICD-10-CM | POA: Insufficient documentation

## 2013-10-22 DIAGNOSIS — I499 Cardiac arrhythmia, unspecified: Secondary | ICD-10-CM | POA: Insufficient documentation

## 2013-10-22 DIAGNOSIS — Z9010 Acquired absence of unspecified breast and nipple: Secondary | ICD-10-CM | POA: Insufficient documentation

## 2013-10-22 DIAGNOSIS — Z79899 Other long term (current) drug therapy: Secondary | ICD-10-CM | POA: Insufficient documentation

## 2013-10-22 NOTE — Addendum Note (Signed)
Encounter addended by: Deirdre Evener, RN on: 10/22/2013  3:46 PM<BR>     Documentation filed: Visit Diagnoses

## 2013-10-22 NOTE — Progress Notes (Signed)
Please see the Nurse Progress Note in the MD Initial Consult Encounter for this patient. 

## 2013-10-24 ENCOUNTER — Ambulatory Visit
Admission: RE | Admit: 2013-10-24 | Discharge: 2013-10-24 | Disposition: A | Discharge status: Home / Self Care | Payer: Medicare Other | Source: Ambulatory Visit | Attending: Radiation Oncology | Admitting: Radiation Oncology

## 2013-10-24 ENCOUNTER — Encounter: Payer: Self-pay | Admitting: *Deleted

## 2013-10-24 DIAGNOSIS — R5381 Other malaise: Secondary | ICD-10-CM | POA: Insufficient documentation

## 2013-10-24 DIAGNOSIS — Z79899 Other long term (current) drug therapy: Secondary | ICD-10-CM | POA: Insufficient documentation

## 2013-10-24 DIAGNOSIS — R197 Diarrhea, unspecified: Secondary | ICD-10-CM | POA: Insufficient documentation

## 2013-10-24 DIAGNOSIS — R5383 Other fatigue: Secondary | ICD-10-CM

## 2013-10-24 DIAGNOSIS — R112 Nausea with vomiting, unspecified: Secondary | ICD-10-CM | POA: Insufficient documentation

## 2013-10-24 DIAGNOSIS — Z510 Encounter for antineoplastic radiation therapy: Secondary | ICD-10-CM | POA: Insufficient documentation

## 2013-10-24 DIAGNOSIS — C250 Malignant neoplasm of head of pancreas: Secondary | ICD-10-CM | POA: Insufficient documentation

## 2013-10-24 NOTE — Progress Notes (Signed)
[place] Psychosocial Distress Screening Clinical Social Work  Clinical Social Work was referred by distress screening protocol.  The patient scored a 5 on the Psychosocial Distress Thermometer which indicates moderate distress. Clinical Social Worker phoned pt to assess for distress and other psychosocial needs. Pt reports to be adjusting well currently and denies needs. She agrees to seek out CSW as needed.   Clinical Social Worker follow up needed: no  Loren Racer, [place] Social Worker [facility] [place] for [facility] Wednesday, Thursday and Friday Phone: [phone] Fax: [phone]

## 2013-10-27 ENCOUNTER — Other Ambulatory Visit (HOSPITAL_BASED_OUTPATIENT_CLINIC_OR_DEPARTMENT_OTHER): Payer: Medicare Other

## 2013-10-27 ENCOUNTER — Ambulatory Visit (HOSPITAL_BASED_OUTPATIENT_CLINIC_OR_DEPARTMENT_OTHER): Payer: Medicare Other | Admitting: Internal Medicine

## 2013-10-27 ENCOUNTER — Other Ambulatory Visit: Payer: Self-pay | Admitting: Physician Assistant

## 2013-10-27 ENCOUNTER — Telehealth: Payer: Self-pay | Admitting: Internal Medicine

## 2013-10-27 VITALS — BP 110/56 | HR 111 | Temp 98.0°F | Resp 18 | Ht 61.0 in | Wt 112.0 lb

## 2013-10-27 DIAGNOSIS — I10 Essential (primary) hypertension: Secondary | ICD-10-CM

## 2013-10-27 DIAGNOSIS — C250 Malignant neoplasm of head of pancreas: Secondary | ICD-10-CM

## 2013-10-27 DIAGNOSIS — Z853 Personal history of malignant neoplasm of breast: Secondary | ICD-10-CM

## 2013-10-27 DIAGNOSIS — C50919 Malignant neoplasm of unspecified site of unspecified female breast: Secondary | ICD-10-CM

## 2013-10-27 DIAGNOSIS — C259 Malignant neoplasm of pancreas, unspecified: Secondary | ICD-10-CM

## 2013-10-27 LAB — COMPREHENSIVE METABOLIC PANEL (CC13)
ALK PHOS: 567 U/L — AB (ref 40–150)
ALT: 127 U/L — ABNORMAL HIGH (ref 0–55)
ANION GAP: 11 meq/L (ref 3–11)
AST: 70 U/L — ABNORMAL HIGH (ref 5–34)
Albumin: 3.6 g/dL (ref 3.5–5.0)
BILIRUBIN TOTAL: 1.15 mg/dL (ref 0.20–1.20)
BUN: 13.9 mg/dL (ref 7.0–26.0)
CO2: 26 meq/L (ref 22–29)
Calcium: 10.3 mg/dL (ref 8.4–10.4)
Chloride: 95 mEq/L — ABNORMAL LOW (ref 98–109)
Creatinine: 0.8 mg/dL (ref 0.6–1.1)
GLUCOSE: 225 mg/dL — AB (ref 70–140)
Potassium: 4 mEq/L (ref 3.5–5.1)
Sodium: 133 mEq/L — ABNORMAL LOW (ref 136–145)
Total Protein: 7.1 g/dL (ref 6.4–8.3)

## 2013-10-27 LAB — CBC WITH DIFFERENTIAL/PLATELET
BASO%: 0.5 % (ref 0.0–2.0)
BASOS ABS: 0.1 10*3/uL (ref 0.0–0.1)
EOS ABS: 0.3 10*3/uL (ref 0.0–0.5)
EOS%: 2.8 % (ref 0.0–7.0)
HEMATOCRIT: 37.6 % (ref 34.8–46.6)
HEMOGLOBIN: 12.4 g/dL (ref 11.6–15.9)
LYMPH%: 11.2 % — AB (ref 14.0–49.7)
MCH: 31.6 pg (ref 25.1–34.0)
MCHC: 33 g/dL (ref 31.5–36.0)
MCV: 95.7 fL (ref 79.5–101.0)
MONO#: 0.9 10*3/uL (ref 0.1–0.9)
MONO%: 8.8 % (ref 0.0–14.0)
NEUT#: 7.6 10*3/uL — ABNORMAL HIGH (ref 1.5–6.5)
NEUT%: 76.7 % (ref 38.4–76.8)
Platelets: 230 10*3/uL (ref 145–400)
RBC: 3.93 10*6/uL (ref 3.70–5.45)
RDW: 14.6 % — ABNORMAL HIGH (ref 11.2–14.5)
WBC: 9.9 10*3/uL (ref 3.9–10.3)
lymph#: 1.1 10*3/uL (ref 0.9–3.3)

## 2013-10-27 LAB — CANCER ANTIGEN 19-9: CA 19 9: 364.4 U/mL — AB (ref <35.0)

## 2013-10-27 MED ORDER — ONDANSETRON HCL 8 MG PO TABS: 8.0000 mg | ORAL_TABLET | Freq: Two times a day (BID) | ORAL | Status: DC | PRN

## 2013-10-27 MED ORDER — PROCHLORPERAZINE MALEATE 10 MG PO TABS: 10.0000 mg | ORAL_TABLET | Freq: Four times a day (QID) | ORAL | Status: DC | PRN

## 2013-10-27 MED ORDER — ESTRADIOL 2 MG VA RING: VAGINAL_RING | VAGINAL | Status: AC

## 2013-10-27 NOTE — Progress Notes (Signed)
  Radiation Oncology         [phone] ________________________________  Name: [name] MRN: [id number]  Date: 10/24/2013  DOB: 11-22-1927  SIMULATION AND TREATMENT PLANNING NOTE   CONSENT VERIFIED: yes   SET UP: Patient is set-up supine   IMMOBILIZATION: The following immobilization is used: customized vac-lock. This complex treatment device will be used on a daily basis during the patient's treatment.   Diagnosis: Pancreatic cancer   NARRATIVE: The patient was brought to the [facility]. Identity was confirmed. All relevant records and images related to the planned course of therapy were reviewed. Then, the patient was positioned in a stable reproducible clinical set-up for radiation therapy using a customized Vac-lock bag. Skin markings were placed. The CT images were loaded into the planning software where the target and avoidance structures were contoured.The radiation prescription was entered and confirmed.   The patient will receive 50.4 Gy in 28 fractions.   Daily image guidance is ordered, and this will be used on a daily basis. This is necessary to ensure accurate and precise localization of the target in addition to accurate alignment of the normal tissue structures in this region.   Treatment planning then occurred.   I have requested : Intensity Modulated Radiotherapy (IMRT) is medically necessary for this case for the following reason: Dose homogeneity; the target is in close proximity to critical normal structures, including the kidneys and the liver. IMRT is thus medically to appropriately treat the patient.   Special treatment procedure  The patient will receive chemotherapy during the course of radiation treatment. The patient may experience increased or overlapping toxicity due to this combined-modality approach and the patient will be monitored for such problems. This may include extra lab work as necessary. This therefore constitutes a  special treatment procedure.    ________________________________  Jodelle Gross, MD, PhD

## 2013-10-27 NOTE — Patient Instructions (Signed)

## 2013-10-27 NOTE — Progress Notes (Addendum)
Radiation Oncology         [phone] ________________________________  Name: CHISTINE DEMATTEO MRN: [id number]  Date: 10/22/2013  DOB: Feb 27, 1928  OA:CZYSAYT,KZSWFUX DAVID, MD  Stark Klein, MD   Concha Norway, MD  REFERRING PHYSICIAN: Stark Klein, MD   DIAGNOSIS: The primary encounter diagnosis was Pancreatic cancer. Diagnoses of Breast cancer and Cancer of head of pancreas were also pertinent to this visit.   HISTORY OF PRESENT ILLNESS::Elease Viona Gilmore Friedland is a 80 y.o. female who is seen for an initial consultation visit. The patient is seen today regarding a new diagnosis of adenocarcinoma of the pancreas. She indicates that she noticed some bowel changes in January of this year. This was associated with diarrhea and lighter colored stools. The patient was placed on antidiarrheal medications and had some lab work. She also has lost approximately 5 pounds.  The patient underwent an ERCP procedure based on abnormalities in terms of lab work. This showed a malignant appearing common bile duct obstruction. Bile duct brushings demonstrated atypical cells which were suspicious for malignancy.  Further workup included an endoscopic ultrasound which showed a 2.9 x 3.3 cm pancreatic head mass. The common bile duct was dilated with a plastic stent and the pancreatic duct was dilated to 1 cm. Fine-needle aspiration was performed which returned positive for malignant cells consistent with adenocarcinoma.  Additional workup has included a CT scan of the chest abdomen and pelvis. No evidence of metastatic disease within the chest. Within the abdomen, a mass is present within the head and uncinate process of the pancreas which resulted in obstruction of the common bile duct and pancreatic duct. No specific features were identified which suggested metastatic disease. The CT scan of the abdomen and pelvis was completed on 10/03/2013, and the CT scan of the chest was completed on 10/20/2013.  The patient's CA  19-9 level is elevated at 640.6.  The patient has been seen by Dr. Barry Dienes and surgical oncology. She she has discussed possible surgery with the patient. The patient is considering this but they also discussed preoperative treatment prior to surgery. This would be beneficial potentially in terms of tumor effect prior to surgery but also would allow the patient additional time to see how she responds to treatment and to make a final decision regarding surgery. The patient has been seen by Dr. [name] in medical oncology regarding possible chemotherapy and I have been asked to see the patient today regarding possible radiation treatment.   PREVIOUS RADIATION THERAPY: Yes: radiation treatment for right-sided breast cancer in 2001 (Dr. Danny Lawless)   PAST MEDICAL HISTORY:  has a past medical history of Arthritis; GERD (gastroesophageal reflux disease); Complication of anesthesia; Dizziness; Seasonal allergies; Frequency of urination; Dysrhythmia; Hypertension; Hyperlipidemia; Thyroid disease; Hypothyroidism; Breast cancer (2001); and Cancer (10/07/13).     PAST SURGICAL HISTORY: Past Surgical History  Procedure Laterality Date  . Joint replacement      right knee  . Carpal tunnel release      bilateral  . Incontinence surgery    . Cataract extraction      left  . Mastectomy      right due to cancer  . Ercp  10/06/2011    Procedure: ENDOSCOPIC RETROGRADE CHOLANGIOPANCREATOGRAPHY (ERCP);  Surgeon: Gatha Mayer, MD;  Location: [facility];  Service: Endoscopy;  Laterality: N/A;  or general per anesthesia  . Sphincterotomy  10/06/2011    Procedure: SPHINCTEROTOMY;  Surgeon: Gatha Mayer, MD;  Location: [facility];  Service: Endoscopy;;  .  Cholecystectomy  10/07/2011    Procedure: LAPAROSCOPIC CHOLECYSTECTOMY WITH INTRAOPERATIVE CHOLANGIOGRAM;  Surgeon: Edward Jolly, MD;  Location: [facility];  Service: General;  Laterality: N/A;  . Tonsillectomy    . Colonoscopy    . Eye surgery    . Lumbar  laminectomy/decompression microdiscectomy  05/27/2012    Procedure: LUMBAR LAMINECTOMY/DECOMPRESSION MICRODISCECTOMY 1 LEVEL;  Surgeon: Ophelia Charter, MD;  Location: [facility];  Service: Neurosurgery;  Laterality: Bilateral;  Lumbar four laminectomy, with bilateral lumbar three laminotomies.  . Ercp N/A 10/04/2013    Procedure: ENDOSCOPIC RETROGRADE CHOLANGIOPANCREATOGRAPHY (ERCP);  Surgeon: Beryle Beams, MD;  Location: [facility];  Service: Gastroenterology;  Laterality: N/A;  . Eus N/A 10/07/2013    Procedure: UPPER ENDOSCOPIC ULTRASOUND (EUS) LINEAR;  Surgeon: Beryle Beams, MD;  Location: [facility];  Service: Endoscopy;  Laterality: N/A;     FAMILY HISTORY: family history includes Breast cancer in her daughter and mother; Cerebral aneurysm in her sister; Heart attack in her father; Heart disease in her father and mother; Hyperlipidemia in her son; Hypertension in her son; Stroke in her father and mother.   SOCIAL HISTORY:  reports that she quit smoking about 56 years ago. She has never used smokeless tobacco. She reports that she drinks alcohol. She reports that she does not use illicit drugs.   ALLERGIES: Aspirin; Gemfibrozil; Nsaids; Pravastatin; and Pse hcl-dm hbr-guaifenesin tan   MEDICATIONS:  Current Outpatient Prescriptions  Medication Sig Dispense Refill  . Blood Glucose Monitoring Suppl (BLOOD GLUCOSE MONITOR KIT) KIT by Does not apply route. relion brand      . Cholecalciferol (VITAMIN D-3) 5000 UNITS TABS Take by mouth daily.       Marland Kitchen estradiol (ESTRING) 2 MG vaginal ring Place 2 mg vaginally every 3 (three) months. follow package directions      . Flaxseed, Linseed, (FLAXSEED OIL PO) Take 5 mLs by mouth daily.       . hydrochlorothiazide (HYDRODIURIL) 25 MG tablet Take 25 mg by mouth daily as needed (swelling).       Marland Kitchen levothyroxine (SYNTHROID, LEVOTHROID) 100 MCG tablet Take 50-100 mcg by mouth See admin instructions. Pt takes 1/2 tab for 50 mcg dose on  Tuesday,thursday,saturday,sunday. 100 mcg  On Monday, Wednesday,friday      . metFORMIN (GLUCOPHAGE-XR) 500 MG 24 hr tablet Take 1,000 mg by mouth 2 (two) times daily.      . metoprolol succinate (TOPROL-XL) 25 MG 24 hr tablet Take 25 mg by mouth daily.      . Multiple Vitamin (MULTIVITAMIN) tablet Take 1 tablet by mouth daily.      . Omega-3 Fatty Acids (FISH OIL) 1000 MG CAPS Take 1,000 mg by mouth daily.       Marland Kitchen omeprazole (PRILOSEC) 20 MG capsule Take 20 mg by mouth 2 (two) times daily.       . sucralfate (CARAFATE) 1 G tablet Take 1 tablet (1 g total) by mouth 2 (two) times daily.  60 tablet  5  . Magnesium 250 MG TABS Take 250 mg by mouth daily.       . TRUEPLUS LANCETS 33G MISC        No current facility-administered medications for this encounter.     REVIEW OF SYSTEMS:  A 15 point review of systems is documented in the electronic medical record. This was obtained by the nursing staff. However, I reviewed this with the patient to discuss relevant findings and make appropriate changes.  Pertinent items are noted in  HPI.    PHYSICAL EXAM:  height is _0  (1.549 m) and weight is 112 lb 8 oz (51.03 kg). Her oral temperature is 97.5 F (36.4 C). Her blood pressure is 126/55 and her pulse is 94. Her respiration is 16 and oxygen saturation is 99%.   ECOG = 1  0 - Asymptomatic (Fully active, able to carry on all predisease activities without restriction)  1 - Symptomatic but completely ambulatory (Restricted in physically strenuous activity but ambulatory and able to carry out work of a light or sedentary nature. For example, light housework, office work)  2 - Symptomatic, <50% in bed during the day (Ambulatory and capable of all self care but unable to carry out any work activities. Up and about more than 50% of waking hours)  3 - Symptomatic, >50% in bed, but not bedbound (Capable of only limited self-care, confined to bed or chair 50% or more of waking hours)  4 - Bedbound  (Completely disabled. Cannot carry on any self-care. Totally confined to bed or chair)  5 - Death   Eustace Pen MM, Creech RH, Tormey DC, et al. [id number]). "Toxicity and response criteria of the [facility]". [place] Oncol. 5 (6): 649-55  General: Well-developed, in no acute distress HEENT: Normocephalic, atraumatic; oral cavity clear Neck: Supple without any lymphadenopathy Cardiovascular: Regular rate and rhythm Respiratory: Clear to auscultation bilaterally GI: Soft, nontender, normal bowel sounds Extremities: No edema present Neuro: No focal deficits     LABORATORY DATA:  Lab Results  Component Value Date   WBC 9.3 10/03/2013   HGB 12.7 10/03/2013   HCT 36.2 10/03/2013   MCV 88.7 10/03/2013   PLT 266 10/03/2013   Lab Results  Component Value Date   NA 132* 10/15/2013   K 4.3 10/15/2013   CL 94* 10/15/2013   CO2 26 10/15/2013   Lab Results  Component Value Date   ALT 116* 10/15/2013   AST 30 10/15/2013   ALKPHOS 493* 10/15/2013   BILITOT 1.5* 10/15/2013      RADIOGRAPHY: Dg Chest 2 View  10/01/2013   CLINICAL DATA:  Abdominal discomfort, weakness  EXAM: CHEST  2 VIEW  COMPARISON:  05/22/2012  FINDINGS: Cardiomediastinal silhouette is stable. Again noted status post right mastectomy. Degenerative changes and levoscoliosis lumbar spine. Stable degenerative changes lower thoracic spine. Mild hyperinflation again noted. No acute infiltrate or pleural effusion. No pulmonary edema.  IMPRESSION: No active disease. Status post right mastectomy. Lumbar levoscoliosis.   Electronically Signed   By: Lahoma Crocker M.D.   On: 10/01/2013 15:12   Ct Chest W Contrast  10/20/2013   CLINICAL DATA:  Pancreatic cancer. Evaluate metastatic disease. History of right breast cancer.  EXAM: CT CHEST WITH CONTRAST  TECHNIQUE: Multidetector CT imaging of the chest was performed during intravenous contrast administration.  CONTRAST:  78m OMNIPAQUE IOHEXOL 300 MG/ML  SOLN  COMPARISON:  CT  ABD/PELVIS W CM dated 10/03/2013  FINDINGS: No pathologically enlarged mediastinal, hilar or axillary lymph nodes. No internal mammary adenopathy. Heart size normal. Three-vessel coronary artery calcification. No pericardial effusion.  Biapical pleural parenchymal scarring. Mild subpleural radiation fibrosis in the anterior right hemi thorax. Findings are worst in the right middle lobe. Mild scattered scarring. Lungs are otherwise clear. No pleural fluid. Airway is unremarkable.  Incidental imaging of the upper abdomen shows pneumobilia with a stent in the common bile duct. Biliary ductal dilatation is again seen and partially imaged. Marked pancreatic ductal dilatation with atrophy of the body  and tail. The primary pancreatic mass in the head and uncinate process, as seen on 10/03/2013, was not imaged today. Visualized portions of the adrenal glands, kidneys, spleen and stomach are grossly unremarkable. No worrisome lytic or sclerotic lesions. Degenerative changes are seen in the spine.  IMPRESSION: 1. No evidence of metastatic disease in the chest. 2. Three-vessel coronary artery calcification. 3. Interval common bile duct stent placement with pneumobilia and persistent marked dilatation of the biliary tree and pancreatic duct, incompletely imaged. Pancreatic mass was not imaged on the current exam. Please refer to examination of 10/03/2013 for further details.   Electronically Signed   By: Lorin Picket M.D.   On: 10/20/2013 15:40   Ct Abdomen Pelvis W Contrast  10/03/2013   CLINICAL DATA:  Mid to lower abdominal pain  EXAM: CT ABDOMEN AND PELVIS WITH CONTRAST  TECHNIQUE: Multidetector CT imaging of the abdomen and pelvis was performed using the standard protocol following bolus administration of intravenous contrast.  CONTRAST:  147m OMNIPAQUE IOHEXOL 300 MG/ML  SOLN  COMPARISON:  05/22/2013  FINDINGS: There is no pleural or pericardial effusion identified. There is diffuse intrahepatic bile duct  dilatation. The common bile duct is markedly dilated measuring up to 2.5 cm, image 24/series 2. There is a mass within the head and uncinate process of the pancreas which measures 3.1 cm, image 25/ series 602. There is not only obstruction of the common bile duct, but the pancreatic duct is also obstructed and dilated. The mass appears to touch but does not encase the superior mesenteric artery, image 29/series 2. The mass also appears to abut the posterior aspect of the portal vein, image 27/series 2. The lumen of the portal vein and superior mesenteric arteries are not compromised. The celiac artery appears uninvolved the spleen is normal. The splenic vein is patent  The adrenal glands both appear normal. Normal appearance of both kidneys. The urinary bladder appears normal. A pessary device is identified within the dome of vagina. Next  Calcified atherosclerotic disease involves the abdominal aorta. There is no aneurysm. No upper abdominal adenopathy. There is no pelvic or inguinal adenopathy identified.  The stomach is normal. The small bowel loops have a normal appearance without obstruction. The appendix is visualized and appears normal. Normal appearance of the colon.  Review of the visualized bony structures is significant for scoliosis and multi level degenerative disc disease. No aggressive lytic or sclerotic bone lesions.  IMPRESSION: 1. Mass within the head and uncinate process of the pancreas obstructs both the common bile duct and pancreatic duct. 2. No specific features identified to suggest metastatic disease.   Electronically Signed   By: TKerby MoorsM.D.   On: 10/03/2013 13:49   Dg Ercp  10/04/2013   CLINICAL DATA:  Obstructive jaundice, ERCP with stent placement  EXAM: ERCP  TECHNIQUE: Multiple spot images obtained with the fluoroscopic device and submitted for interpretation post-procedure.  COMPARISON:  CT ABD/PELVIS W CM dated 10/03/2013; DG ABD 2 VIEWS dated 10/01/2013; CT ABD/PELV WO CM  dated 05/22/2013  FINDINGS: Two spot intraoperative fluoroscopic images during ERCP are provided for review.  Initial image demonstrates an ERCP probe overlying the right upper quadrant with selective cannulation of the common bile duct. There is minimal opacification of the intrahepatic biliary system which appears moderate to severely dilated centrally as demonstrated on recently performed abdominal CT.  Subsequent image demonstrates deployment of a plastic internal biliary stent overlying the distal aspect of the common bile duct.  Post cholecystectomy.  Enteric contrast is seen within the colon.  IMPRESSION: ERCP with plastic biliary stent placement as above.  These images were submitted for radiologic interpretation only. Please see the procedural report for the amount of contrast and the fluoroscopy time utilized.   Electronically Signed   By: Sandi Mariscal M.D.   On: 10/04/2013 11:59   Dg Abd 2 Views  10/01/2013   CLINICAL DATA:  Abdominal pain  EXAM: ABDOMEN - 2 VIEW  COMPARISON:  CT ABD/PELV WO CM dated 05/22/2013; DG LUMBAR SPINE 1 VIEW dated 05/27/2012  FINDINGS: There is a paucity of bowel gas. A moderate to large amount of stool is appreciated. Severe levoscoliosis appreciated within the lumbar spine. Atherosclerotic calcifications identified within the aorta.  IMPRESSION: Nonobstructive bowel gas pattern with large amount of stool.   Electronically Signed   By: Margaree Mackintosh M.D.   On: 10/01/2013 15:21       IMPRESSION: The patient has a new diagnosis of adenocarcinoma of the pancreas. No sign of metastatic disease although the patient does have an elevated CA 19-9 level at 641. The patient does have a good performance status at this time.  I believe that the patient is a good candidate for initial chemoradiation treatment, possibly followed by surgical resection depending on how the patient does with initial treatment and her wishes which will be discussed further with Dr. Barry Dienes at the  appropriate time. I therefore discussed with the patient a potential 5-1/2-6 week course of radiation treatment with concurrent chemotherapy. We discussed the rationale and potential benefit of such a treatment as well as the possible side effects and risks. All of her questions were answered. The patient is interested in proceeding with this treatment at this time.   PLAN: The patient will be scheduled for a simulation in the near future such that she can proceed with treatment planning. I anticipate beginning patient chemoradiation treatment on 11/03/2013.       ________________________________   Jodelle Gross, MD, PhD

## 2013-10-27 NOTE — Telephone Encounter (Signed)
gv adn printed appt sched and avs for pt for March thru May 2015....sed added tx

## 2013-10-27 NOTE — Progress Notes (Signed)
[place], MD [address] [place] [address]  DIAGNOSIS: Pancreatic cancer - Plan: CBC with Differential, Comprehensive metabolic panel (Cmet) - [facility], CBC with Differential, Basic metabolic panel (Bmet) - [facility], Cancer antigen 19-9, Ambulatory referral to Nutrition and Diabetic Education, prochlorperazine (COMPAZINE) 10 MG tablet, ondansetron (ZOFRAN) 8 MG tablet  Breast cancer  Chief Complaint  Patient presents with  . Malignant neoplasm of head of pancreas    CURRENT TREATMENT: Planning neoadjuvant chemoradiation starting on 03/30 - 12/10/2013.  Gemcitabine 500 mg/m2 weekly with concurrent XRT.      Pancreatic cancer   10/03/2013 Imaging CT of abdomen: Mass within the head and uncinate process of the pancreas obstructs both the common bile duct and pancreatic duct.  No evidence of mets   10/03/2013 - 10/05/2013 Hospital Admission Admitted to hospitalist team at [facility] hospital due to bstructive jaundice and transaminitis secondary to pancreatic head mass noted on outpatient CT abdomen and pelvis. GI consulted.    10/04/2013 Procedure ERCP (Dr. Benson Norway) performed.  Malignant CBD obstruction with brushings obtained. EUS planned.    10/04/2013 Pathology Diagnosis BILE DUCT BRUSHING, COMMON (SPECIMEN 1 OF 1 COLLECTED 10-04-2013)  ATYPICAL CELLS, HIGHLY SUSPICIOUS FOR MALIGNANCY.   10/04/2013 Tumor Marker CA 19 9, 640.8   10/07/2013 Procedure EUS (Dr. Benson Norway) performed revealing a 2.9 x 3.3 cm pancreatic head mass without portal vein invasion, dilated CBC to 1.5 cm with plastic stent, dilated PD to 1 cm. FNA obtained.   10/07/2013 Pathology FINE NEEDLE ASPIRATION, ENDOSCOPIC, PANCREAS HEAD(SPECIMEN 1 OF 1 COLLECTED 10/07/13): MALIGNANT CELLS CONSISTENT WITH ADENOCARCINOMA.   10/07/2013 Initial Diagnosis Pancreatic cancer   10/14/2013 Cancer Staging Seen by Dr. Barry Dienes who recommends chemoradiation. Referred to oncology and radiation  oncology. CT of chest ordered to complete staging.    10/14/2013 Family History referring her to genetics since she has had breast cancer, and has significant family history of breast cancer and prostate cancer   10/17/2013 Imaging CT of Chest: Negative for lung metastases.    INTERVAL HISTORY: [name] 80 y.o. female with a history of breast cancer s/p mastectomy of right breast (who is being referred by Dr. Barry Dienes of Surgical oncology for further evaluation of her newly diagnosed pancreatic adenocarcinoma. Today, she is accompanied by her family including her daughter-in-law [name] and her son [name] and a friend [name].   As previously noted, she was evaluated by Dr. Barry Dienes for consideration of surgery for her localized pancreatic cancer  and recommended chemoradiation while the patient determines if she wants to proceed given the extent of the surgery required. She was seen by Dr. Lisbeth Renshaw on 03/18 and determined to be a good candidate for chemoradiation.  She had a CT of chest which was negative.   Patient was driving a few months ago and remains independent of her basic, intermediate and advanced activities of daily of living. She has a notable family history of cancer. Her daughter had breast cancer at age 78. Her sister passed away from breast cancer with metastatic disease to the bone. She is also scheduled to see genetics counseling.   MEDICAL HISTORY: Past Medical History  Diagnosis Date  . Arthritis     Left knee, s/p cortisone injection  . GERD (gastroesophageal reflux disease)   . Complication of anesthesia     Very hard to wake up after anesthesia  . Dizziness   . Seasonal allergies   . Frequency of urination   .  Dysrhythmia     Sinus tachycardia can get up to 108. Started after chemo   . Hypertension   . Hyperlipidemia   . Thyroid disease   . Hypothyroidism   . Breast cancer 2001    S/P mastectomy, XRT and chemo  . Cancer 10/07/13    pancreatic  cancer/adenocarcinoma    INTERIM HISTORY: has Hypertension; Hypothyroidism; GERD (gastroesophageal reflux disease); Cholelithiasis with choledocholithiasis; Hyperlipidemia; Breast cancer; Encounter for long-term (current) use of other medications; Transaminitis; T2 NIDDM; Pancreatic cancer; and Cancer of head of pancreas on her problem list.    ALLERGIES:  is allergic to aspirin; gemfibrozil; nsaids; pravastatin; and pse hcl-dm hbr-guaifenesin tan.  MEDICATIONS: has a current medication list which includes the following prescription(s): blood glucose monitor kit, vitamin d-3, estradiol, flaxseed (linseed), hydrochlorothiazide, levothyroxine, magnesium, metformin, metoprolol succinate, multivitamin, fish oil, omeprazole, sucralfate, trueplus lancets 33g, ondansetron, and prochlorperazine.  SURGICAL HISTORY:  Past Surgical History  Procedure Laterality Date  . Joint replacement      right knee  . Carpal tunnel release      bilateral  . Incontinence surgery    . Cataract extraction      left  . Mastectomy      right due to cancer  . Ercp  10/06/2011    Procedure: ENDOSCOPIC RETROGRADE CHOLANGIOPANCREATOGRAPHY (ERCP);  Surgeon: Gatha Mayer, MD;  Location: [facility];  Service: Endoscopy;  Laterality: N/A;  or general per anesthesia  . Sphincterotomy  10/06/2011    Procedure: SPHINCTEROTOMY;  Surgeon: Gatha Mayer, MD;  Location: [facility];  Service: Endoscopy;;  . Cholecystectomy  10/07/2011    Procedure: LAPAROSCOPIC CHOLECYSTECTOMY WITH INTRAOPERATIVE CHOLANGIOGRAM;  Surgeon: Edward Jolly, MD;  Location: [facility];  Service: General;  Laterality: N/A;  . Tonsillectomy    . Colonoscopy    . Eye surgery    . Lumbar laminectomy/decompression microdiscectomy  05/27/2012    Procedure: LUMBAR LAMINECTOMY/DECOMPRESSION MICRODISCECTOMY 1 LEVEL;  Surgeon: Ophelia Charter, MD;  Location: [facility];  Service: Neurosurgery;  Laterality: Bilateral;  Lumbar four laminectomy, with  bilateral lumbar three laminotomies.  . Ercp N/A 10/04/2013    Procedure: ENDOSCOPIC RETROGRADE CHOLANGIOPANCREATOGRAPHY (ERCP);  Surgeon: Beryle Beams, MD;  Location: [facility];  Service: Gastroenterology;  Laterality: N/A;  . Eus N/A 10/07/2013    Procedure: UPPER ENDOSCOPIC ULTRASOUND (EUS) LINEAR;  Surgeon: Beryle Beams, MD;  Location: [facility];  Service: Endoscopy;  Laterality: N/A;    REVIEW OF SYSTEMS:   Constitutional: Denies fevers, chills or abnormal weight loss Eyes: Denies blurriness of vision Ears, nose, mouth, throat, and face: Denies mucositis or sore throat Respiratory: Denies cough, dyspnea or wheezes Cardiovascular: Denies palpitation, chest discomfort or lower extremity swelling Gastrointestinal:  Denies nausea, heartburn or change in bowel habits Skin: Denies abnormal skin rashes Lymphatics: Denies new lymphadenopathy or easy bruising Neurological:Denies numbness, tingling or new weaknesses Behavioral/Psych: Mood is stable, no new changes  All other systems were reviewed with the patient and are negative.  PHYSICAL EXAMINATION: ECOG PERFORMANCE STATUS: 0 - Asymptomatic  Blood pressure 110/56, pulse 111, temperature 98 F (36.7 C), temperature source Oral, resp. rate 18, height $RemoveBe'5\' 1"'smvFFlvSn$  (1.549 m), weight 112 lb (50.803 kg), SpO2 98.00%.  GENERAL:alert, no distress and comfortable;easily gets to the exam table. Ambulates with cane secondary to right knee prior surgery.  SKIN: skin color, texture, turgor are normal, no rashes or significant lesions EYES: normal, Conjunctiva are pink and non-injected, sclera clear OROPHARYNX:no exudate, no erythema and lips, buccal mucosa,  and tongue normal  NECK: supple, thyroid normal size, non-tender, without nodularity LYMPH:  no palpable lymphadenopathy in the cervical, axillary or supraclavicular LUNGS: clear to auscultation with normal breathing effort, no wheezes or rhonchi HEART: regular rate & rhythm and no murmurs and no  lower extremity edema ABDOMEN:abdomen soft, non-tender and normal bowel sounds Musculoskeletal:no cyanosis of digits and no clubbing  NEURO: alert & oriented x 3 with fluent speech, no focal motor/sensory deficits  Labs:  Lab Results  Component Value Date   WBC 9.9 10/27/2013   HGB 12.4 10/27/2013   HCT 37.6 10/27/2013   MCV 95.7 10/27/2013   PLT 230 10/27/2013   NEUTROABS 7.6* 10/27/2013      Chemistry      Component Value Date/Time   NA 133* 10/27/2013 0810   NA 132* 10/15/2013 1313   K 4.0 10/27/2013 0810   K 4.3 10/15/2013 1313   CL 94* 10/15/2013 1313   CO2 26 10/27/2013 0810   CO2 26 10/15/2013 1313   BUN 13.9 10/27/2013 0810   BUN 15 10/15/2013 1313   CREATININE 0.8 10/27/2013 0810   CREATININE 0.69 10/15/2013 1313   CREATININE 0.73 10/05/2013 0423      Component Value Date/Time   CALCIUM 10.3 10/27/2013 0810   CALCIUM 9.7 10/15/2013 1313   ALKPHOS 567* 10/27/2013 0810   ALKPHOS 493* 10/15/2013 1313   AST 70* 10/27/2013 0810   AST 30 10/15/2013 1313   ALT 127* 10/27/2013 0810   ALT 116* 10/15/2013 1313   BILITOT 1.15 10/27/2013 0810   BILITOT 1.5* 10/15/2013 1313      CBC:  Recent Labs Lab 10/27/13 0805  WBC 9.9  NEUTROABS 7.6*  HGB 12.4  HCT 37.6  MCV 95.7  PLT 230   Studies:  No results found.   RADIOGRAPHIC STUDIES: Dg Chest 2 View  10/01/2013   CLINICAL DATA:  Abdominal discomfort, weakness  EXAM: CHEST  2 VIEW  COMPARISON:  05/22/2012  FINDINGS: Cardiomediastinal silhouette is stable. Again noted status post right mastectomy. Degenerative changes and levoscoliosis lumbar spine. Stable degenerative changes lower thoracic spine. Mild hyperinflation again noted. No acute infiltrate or pleural effusion. No pulmonary edema.  IMPRESSION: No active disease. Status post right mastectomy. Lumbar levoscoliosis.   Electronically Signed   By: Natasha Mead M.D.   On: 10/01/2013 15:12   Ct Chest W Contrast  10/20/2013   CLINICAL DATA:  Pancreatic cancer. Evaluate metastatic disease.  History of right breast cancer.  EXAM: CT CHEST WITH CONTRAST  TECHNIQUE: Multidetector CT imaging of the chest was performed during intravenous contrast administration.  CONTRAST:  18mL OMNIPAQUE IOHEXOL 300 MG/ML  SOLN  COMPARISON:  CT ABD/PELVIS W CM dated 10/03/2013  FINDINGS: No pathologically enlarged mediastinal, hilar or axillary lymph nodes. No internal mammary adenopathy. Heart size normal. Three-vessel coronary artery calcification. No pericardial effusion.  Biapical pleural parenchymal scarring. Mild subpleural radiation fibrosis in the anterior right hemi thorax. Findings are worst in the right middle lobe. Mild scattered scarring. Lungs are otherwise clear. No pleural fluid. Airway is unremarkable.  Incidental imaging of the upper abdomen shows pneumobilia with a stent in the common bile duct. Biliary ductal dilatation is again seen and partially imaged. Marked pancreatic ductal dilatation with atrophy of the body and tail. The primary pancreatic mass in the head and uncinate process, as seen on 10/03/2013, was not imaged today. Visualized portions of the adrenal glands, kidneys, spleen and stomach are grossly unremarkable. No worrisome lytic or sclerotic lesions. Degenerative changes are  seen in the spine.  IMPRESSION: 1. No evidence of metastatic disease in the chest. 2. Three-vessel coronary artery calcification. 3. Interval common bile duct stent placement with pneumobilia and persistent marked dilatation of the biliary tree and pancreatic duct, incompletely imaged. Pancreatic mass was not imaged on the current exam. Please refer to examination of 10/03/2013 for further details.   Electronically Signed   By: Lorin Picket M.D.   On: 10/20/2013 15:40   Ct Abdomen Pelvis W Contrast  10/03/2013   CLINICAL DATA:  Mid to lower abdominal pain  EXAM: CT ABDOMEN AND PELVIS WITH CONTRAST  TECHNIQUE: Multidetector CT imaging of the abdomen and pelvis was performed using the standard protocol following  bolus administration of intravenous contrast.  CONTRAST:  18mL OMNIPAQUE IOHEXOL 300 MG/ML  SOLN  COMPARISON:  05/22/2013  FINDINGS: There is no pleural or pericardial effusion identified. There is diffuse intrahepatic bile duct dilatation. The common bile duct is markedly dilated measuring up to 2.5 cm, image 24/series 2. There is a mass within the head and uncinate process of the pancreas which measures 3.1 cm, image 25/ series 602. There is not only obstruction of the common bile duct, but the pancreatic duct is also obstructed and dilated. The mass appears to touch but does not encase the superior mesenteric artery, image 29/series 2. The mass also appears to abut the posterior aspect of the portal vein, image 27/series 2. The lumen of the portal vein and superior mesenteric arteries are not compromised. The celiac artery appears uninvolved the spleen is normal. The splenic vein is patent  The adrenal glands both appear normal. Normal appearance of both kidneys. The urinary bladder appears normal. A pessary device is identified within the dome of vagina. Next  Calcified atherosclerotic disease involves the abdominal aorta. There is no aneurysm. No upper abdominal adenopathy. There is no pelvic or inguinal adenopathy identified.  The stomach is normal. The small bowel loops have a normal appearance without obstruction. The appendix is visualized and appears normal. Normal appearance of the colon.  Review of the visualized bony structures is significant for scoliosis and multi level degenerative disc disease. No aggressive lytic or sclerotic bone lesions.  IMPRESSION: 1. Mass within the head and uncinate process of the pancreas obstructs both the common bile duct and pancreatic duct. 2. No specific features identified to suggest metastatic disease.   Electronically Signed   By: Kerby Moors M.D.   On: 10/03/2013 13:49   Dg Ercp  10/04/2013   CLINICAL DATA:  Obstructive jaundice, ERCP with stent placement   EXAM: ERCP  TECHNIQUE: Multiple spot images obtained with the fluoroscopic device and submitted for interpretation post-procedure.  COMPARISON:  CT ABD/PELVIS W CM dated 10/03/2013; DG ABD 2 VIEWS dated 10/01/2013; CT ABD/PELV WO CM dated 05/22/2013  FINDINGS: Two spot intraoperative fluoroscopic images during ERCP are provided for review.  Initial image demonstrates an ERCP probe overlying the right upper quadrant with selective cannulation of the common bile duct. There is minimal opacification of the intrahepatic biliary system which appears moderate to severely dilated centrally as demonstrated on recently performed abdominal CT.  Subsequent image demonstrates deployment of a plastic internal biliary stent overlying the distal aspect of the common bile duct.  Post cholecystectomy.  Enteric contrast is seen within the colon.  IMPRESSION: ERCP with plastic biliary stent placement as above.  These images were submitted for radiologic interpretation only. Please see the procedural report for the amount of contrast and the fluoroscopy time utilized.  Electronically Signed   By: Sandi Mariscal M.D.   On: 10/04/2013 11:59   Dg Abd 2 Views  10/01/2013   CLINICAL DATA:  Abdominal pain  EXAM: ABDOMEN - 2 VIEW  COMPARISON:  CT ABD/PELV WO CM dated 05/22/2013; DG LUMBAR SPINE 1 VIEW dated 05/27/2012  FINDINGS: There is a paucity of bowel gas. A moderate to large amount of stool is appreciated. Severe levoscoliosis appreciated within the lumbar spine. Atherosclerotic calcifications identified within the aorta.  IMPRESSION: Nonobstructive bowel gas pattern with large amount of stool.   Electronically Signed   By: Margaree Mackintosh M.D.   On: 10/01/2013 15:21    ASSESSMENT: [name] 80 y.o. female with a history of Pancreatic cancer - Plan: CBC with Differential, Comprehensive metabolic panel (Cmet) - [facility], CBC with Differential, Basic metabolic panel (Bmet) - [facility], Cancer antigen 19-9, Ambulatory referral to Nutrition  and Diabetic Education, prochlorperazine (COMPAZINE) 10 MG tablet, ondansetron (ZOFRAN) 8 MG tablet  Breast cancer   PLAN:  1. Pancreatic Adenocarcinoma, newly diagnosed starting chemoradiation on 11/03/2013.  --We reviewed extensively her pathology and CT of abdomen as noted above consistent with localized pancreatic adenocarcinoma. Her CT of chest is negative for metastases.  Her CA 19 9 is elevated 640.6. Her functional status is relatively good with adequate organ function. She would make a good candidate for chemoradiation. We then explained options for chemotherapy including gemcitabine alone or gemcitabine plus albumin-bound paclitaxel (Abraxane). We explained that with chemoradiation approaches, chemotherapy is used as a radiosensitizer, increasing the toxicity of radiation to the pancreatic cancer, most commonly used in the adjuvant setting. The role of neoadjuvant regimens is less defined based on randomized trials.  --Based on her age, we will consider RT (50.4 Gy in 28 fractions) plus Gemcitabine (500 mg/m2 q weekly x 6 --dosed decreased from 600 mg/mg2 in original study) followed by 5 cycles of Gemcitabine alone (1,000 mg/m2 weekly x 3 every 4 weeks). Given her age, doses may require modification.  Gemcitabine plus involved field RT was associated with an improved overall survival compared to gemcitabine alone for localized, unresectable pancreatic cancer. The reference is Loehrer, P.J., JCO, 2008, Vol 26, No 15S (May 20 Supplement, 2008), 4506). We provided the indications, benefits and side-effects of treatment and she agreed to proceed with chemoradiation.  Indications were for control of her disease and possible decreasing the tumor size which might potentially make surgery more of an option; benefits also might include improvement in quality of life measurements and improvement in overall survival; and side-effects include but are not limited to nausea, vomiting, fevers, myelosuppression  which might result in life-threatening infections, gastrointestinal disturbances, such as diarrhea and constipation.   We will start her weekly gemcitabine chemotherapy on 11/03/2013.   2. Elevated transaminases, mild.  --Likely secondary to the above.   We will follow will labs weekly.   Bilirubin is within normal limits. No dose adjustment necessary.  If Bilirubin is greater than 1.6, we will decrease dose by 20%.   3. Family history of cancers.  --Given extensive breast cancer and other cancer in the family, a referral for genetic counseling has been made.   4. Follow-up.  --Patient will follow up with me in 2 weeks with CA 19-9.  She will have labs weekly. She has been referred to nutrition and chemotherapy teaching classes.  Handouts detailing gemcitabine has been provided.   All questions were answered. The patient knows to call the clinic with any problems, questions or concerns. We  can certainly see the patient much sooner if necessary.  I spent 25 minutes counseling the patient face to face. The total time spent in the appointment was 40 minutes.    Quentina Fronek, MD 10/27/2013 9:36 AM

## 2013-10-28 ENCOUNTER — Other Ambulatory Visit: Payer: Medicare Other

## 2013-10-28 ENCOUNTER — Encounter: Payer: Self-pay | Admitting: *Deleted

## 2013-10-28 ENCOUNTER — Encounter: Payer: Self-pay | Admitting: Genetic Counselor

## 2013-10-28 ENCOUNTER — Ambulatory Visit (HOSPITAL_BASED_OUTPATIENT_CLINIC_OR_DEPARTMENT_OTHER): Payer: Medicare Other | Admitting: Genetic Counselor

## 2013-10-28 DIAGNOSIS — Z803 Family history of malignant neoplasm of breast: Secondary | ICD-10-CM

## 2013-10-28 DIAGNOSIS — Z801 Family history of malignant neoplasm of trachea, bronchus and lung: Secondary | ICD-10-CM

## 2013-10-28 DIAGNOSIS — C250 Malignant neoplasm of head of pancreas: Secondary | ICD-10-CM

## 2013-10-28 DIAGNOSIS — Z853 Personal history of malignant neoplasm of breast: Secondary | ICD-10-CM

## 2013-10-28 DIAGNOSIS — C259 Malignant neoplasm of pancreas, unspecified: Secondary | ICD-10-CM

## 2013-10-28 DIAGNOSIS — IMO0002 Reserved for concepts with insufficient information to code with codable children: Secondary | ICD-10-CM

## 2013-10-28 DIAGNOSIS — Z8042 Family history of malignant neoplasm of prostate: Secondary | ICD-10-CM

## 2013-10-28 DIAGNOSIS — C50919 Malignant neoplasm of unspecified site of unspecified female breast: Secondary | ICD-10-CM

## 2013-10-28 NOTE — Progress Notes (Signed)
Dr. Stark Klein requested a consultation for genetic counseling and risk assessment for [name], a 80 y.o. female, for discussion of her personal history of breast and pancreatic cancer and family history of breast, prostate and lung cancer.  She presents to clinic today to discuss the possibility of a genetic predisposition to cancer, and to further clarify her risks, as well as her family members' risks for cancer.   HISTORY OF PRESENT ILLNESS: In 2001, at the age of 65, [name] was diagnosed with invasive lobular cancer of the breast. In 2015, at the age of 38, [name] was diagnosed with adenocarcinoma of the pancreas. She reports that she had spoken with genetics when she was first diagnosed with breast cancer but did not undergo testing at that time.  Her daughter did not have genetic testing when she was diagnosed because it was so expensive.  That was in 2004.    Past Medical History  Diagnosis Date  . Arthritis     Left knee, s/p cortisone injection  . GERD (gastroesophageal reflux disease)   . Complication of anesthesia     Very hard to wake up after anesthesia  . Dizziness   . Seasonal allergies   . Frequency of urination   . Dysrhythmia     Sinus tachycardia can get up to 108. Started after chemo   . Hypertension   . Hyperlipidemia   . Thyroid disease   . Hypothyroidism   . Breast cancer 2001    S/P mastectomy, XRT and chemo  . Cancer 10/07/13    pancreatic cancer/adenocarcinoma    Past Surgical History  Procedure Laterality Date  . Joint replacement      right knee  . Carpal tunnel release      bilateral  . Incontinence surgery    . Cataract extraction      left  . Mastectomy      right due to cancer  . Ercp  10/06/2011    Procedure: ENDOSCOPIC RETROGRADE CHOLANGIOPANCREATOGRAPHY (ERCP);  Surgeon: Gatha Mayer, MD;  Location: [facility];  Service: Endoscopy;  Laterality: N/A;  or general per anesthesia  . Sphincterotomy  10/06/2011    Procedure:  SPHINCTEROTOMY;  Surgeon: Gatha Mayer, MD;  Location: [facility];  Service: Endoscopy;;  . Cholecystectomy  10/07/2011    Procedure: LAPAROSCOPIC CHOLECYSTECTOMY WITH INTRAOPERATIVE CHOLANGIOGRAM;  Surgeon: Edward Jolly, MD;  Location: [facility];  Service: General;  Laterality: N/A;  . Tonsillectomy    . Colonoscopy    . Eye surgery    . Lumbar laminectomy/decompression microdiscectomy  05/27/2012    Procedure: LUMBAR LAMINECTOMY/DECOMPRESSION MICRODISCECTOMY 1 LEVEL;  Surgeon: Ophelia Charter, MD;  Location: [facility];  Service: Neurosurgery;  Laterality: Bilateral;  Lumbar four laminectomy, with bilateral lumbar three laminotomies.  . Ercp N/A 10/04/2013    Procedure: ENDOSCOPIC RETROGRADE CHOLANGIOPANCREATOGRAPHY (ERCP);  Surgeon: Beryle Beams, MD;  Location: [facility];  Service: Gastroenterology;  Laterality: N/A;  . Eus N/A 10/07/2013    Procedure: UPPER ENDOSCOPIC ULTRASOUND (EUS) LINEAR;  Surgeon: Beryle Beams, MD;  Location: [facility];  Service: Endoscopy;  Laterality: N/A;    History   Social History  . Marital Status: Widowed    Spouse Name: N/A    Number of Children: 2  . Years of Education: N/A   Social History Main Topics  . Smoking status: Former Smoker -- 0.25 packs/day for 5 years    Types: Cigarettes    Quit date: 10/01/1957  .  Smokeless tobacco: Never Used     Comment: Quit 50 years ago.  . Alcohol Use: Yes     Comment: occasionally; socially; wine  . Drug Use: No  . Sexual Activity: None   Other Topics Concern  . None   Social History Narrative   Widowed.  Lives alone, ambulates with a cane.    REPRODUCTIVE HISTORY AND PERSONAL RISK ASSESSMENT FACTORS: Menarche was at age 67.   postmenopausal Uterus Intact: yes Ovaries Intact: yes G4P2A2, first live birth at age 61  She has not previously undergone treatment for infertility.   Oral Contraceptive use: "not long"   She has used HRT in the past.    FAMILY HISTORY:  We obtained a  detailed, 4-generation family history.  Significant diagnoses are listed below: Family History  Problem Relation Age of Onset  . Stroke Mother   . Breast cancer Mother     dx in her 84s  . Heart disease Mother   . Stroke Father   . Heart disease Father   . Heart attack Father   . Breast cancer Daughter 64  . Cerebral aneurysm Sister   . Hyperlipidemia Son   . Hypertension Son   . Prostate cancer Maternal Grandfather   . Breast cancer Sister 28  . Breast cancer Other     niece dx in her 68s  . Lung cancer Other   . Cancer Other 20    great niece with cancer of the lining of the heart.    Patient's ancestors are of [place] descent. There is no reported Ashkenazi Jewish ancestry. There is no known consanguinity.  GENETIC COUNSELING ASSESSMENT: [name] is a 80 y.o. female with a personal history of breast and pancreatic cancer and family history of breast and prostate cancer which somewhat suggestive of a hereditary cancer syndromes and predisposition to cancer. We, therefore, discussed and recommended the following at today's visit.   DISCUSSION: We reviewed the characteristics, features and inheritance patterns of hereditary cancer syndromes. We also discussed genetic testing, including the appropriate family members to test, the process of testing, insurance coverage and turn-around-time for results. We discussed the most common reason why someone would have both breast and pancreatic cancer, which include BRCA mutations.  We also discussed PALB2 as a potential cause of breast and pancreatic cancer.  [name] is very interested in undergoing genetic testing "for her family".  PLAN: After considering the risks, benefits, and limitations, [name] provided informed consent to pursue genetic testing and the blood sample will be sent to [facility] for analysis of the Breast/Ovarian Cancer panel. We discussed the implications of a positive, negative and/ or variant of  uncertain significance genetic test result. Results should be available within approximately 3 weeks' time, at which point they will be disclosed by telephone to [facility], as will any additional recommendations warranted by these results. [name] will receive a summary of her genetic counseling visit and a copy of her results once available. This information will also be available in Epic. We encouraged [name] to remain in contact with cancer genetics annually so that we can continuously update the family history and inform her of any changes in cancer genetics and testing that may be of benefit for her family. [name]'s questions were answered to her satisfaction today. Our contact information was provided should additional questions or concerns arise.  The patient was seen for a total of 60 minutes, greater than 50%  of which was spent face-to-face counseling.  This note will also be sent to the referring provider via the electronic medical record. The patient will be supplied with a summary of this genetic counseling discussion as well as educational information on the discussed hereditary cancer syndromes following the conclusion of their visit.   Patient was discussed with Dr. Marcy Panning.   _______________________________________________________________________ For Office Staff:  Number of people involved in session: 4 Was an Intern/ student involved with case: no

## 2013-10-29 ENCOUNTER — Ambulatory Visit (INDEPENDENT_AMBULATORY_CARE_PROVIDER_SITE_OTHER): Payer: Medicare Other | Admitting: Emergency Medicine

## 2013-10-29 ENCOUNTER — Encounter: Payer: Self-pay | Admitting: Emergency Medicine

## 2013-10-29 VITALS — BP 104/62 | HR 98 | Temp 98.4°F | Resp 16 | Ht 61.0 in | Wt 110.0 lb

## 2013-10-29 DIAGNOSIS — E119 Type 2 diabetes mellitus without complications: Secondary | ICD-10-CM

## 2013-10-29 DIAGNOSIS — K219 Gastro-esophageal reflux disease without esophagitis: Secondary | ICD-10-CM

## 2013-10-29 DIAGNOSIS — Z79899 Other long term (current) drug therapy: Secondary | ICD-10-CM

## 2013-10-29 DIAGNOSIS — E039 Hypothyroidism, unspecified: Secondary | ICD-10-CM

## 2013-10-29 NOTE — Patient Instructions (Signed)
Diabetes Meal Planning Guide The diabetes meal planning guide is a tool to help you plan your meals and snacks. It is important for people with diabetes to manage their blood glucose (sugar) levels. Choosing the right foods and the right amounts throughout your day will help control your blood glucose. Eating right can even help you improve your blood pressure and reach or maintain a healthy weight. CARBOHYDRATE COUNTING MADE EASY When you eat carbohydrates, they turn to sugar. This raises your blood glucose level. Counting carbohydrates can help you control this level so you feel better. When you plan your meals by counting carbohydrates, you can have more flexibility in what you eat and balance your medicine with your food intake. Carbohydrate counting simply means adding up the total amount of carbohydrate grams in your meals and snacks. Try to eat about the same amount at each meal. Foods with carbohydrates are listed below. Each portion below is 1 carbohydrate serving or 15 grams of carbohydrates. Ask your dietician how many grams of carbohydrates you should eat at each meal or snack. Grains and Starches  1 slice bread.   English muffin or hotdog/hamburger bun.   cup cold cereal (unsweetened).   cup cooked pasta or rice.   cup starchy vegetables (corn, potatoes, peas, beans, winter squash).  1 tortilla (6 inches).   bagel.  1 waffle or pancake (size of a CD).   cup cooked cereal.  4 to 6 small crackers. *Whole grain is recommended. Fruit  1 cup fresh unsweetened berries, melon, papaya, pineapple.  1 small fresh fruit.   banana or mango.   cup fruit juice (4 oz unsweetened).   cup canned fruit in natural juice or water.  2 tbs dried fruit.  12 to 15 grapes or cherries. Milk and Yogurt  1 cup fat-free or 1% milk.  1 cup soy milk.  6 oz light yogurt with sugar-free sweetener.  6 oz low-fat soy yogurt.  6 oz plain yogurt. Vegetables  1 cup raw or  cup  cooked is counted as 0 carbohydrates or a "free" food.  If you eat 3 or more servings at 1 meal, count them as 1 carbohydrate serving. Other Carbohydrates   oz chips or pretzels.   cup ice cream or frozen yogurt.   cup sherbet or sorbet.  2 inch square cake, no frosting.  1 tbs honey, sugar, jam, jelly, or syrup.  2 small cookies.  3 squares of graham crackers.  3 cups popcorn.  6 crackers.  1 cup broth-based soup.  Count 1 cup casserole or other mixed foods as 2 carbohydrate servings.  Foods with less than 20 calories in a serving may be counted as 0 carbohydrates or a "free" food. You may want to purchase a book or computer software that lists the carbohydrate gram counts of different foods. In addition, the nutrition facts panel on the labels of the foods you eat are a good source of this information. The label will tell you how big the serving size is and the total number of carbohydrate grams you will be eating per serving. Divide this number by 15 to obtain the number of carbohydrate servings in a portion. Remember, 1 carbohydrate serving equals 15 grams of carbohydrate. SERVING SIZES Measuring foods and serving sizes helps you make sure you are getting the right amount of food. The list below tells how big or small some common serving sizes are.  1 oz.........4 stacked dice.  3 oz.........Deck of cards.  1 tsp........Tip   of little finger.  1 tbs......Marland KitchenMarland KitchenThumb.  2 tbs.......Marland KitchenGolf ball.   cup......Marland KitchenHalf of a fist.  1 cup.......Marland KitchenA fist. SAMPLE DIABETES MEAL PLAN Below is a sample meal plan that includes foods from the grain and starches, dairy, vegetable, fruit, and meat groups. A dietician can individualize a meal plan to fit your calorie needs and tell you the number of servings needed from each food group. However, controlling the total amount of carbohydrates in your meal or snack is more important than making sure you include all of the food groups at every  meal. You may interchange carbohydrate containing foods (dairy, starches, and fruits). The meal plan below is an example of a 2000 calorie diet using carbohydrate counting. This meal plan has 17 carbohydrate servings. Breakfast  1 cup oatmeal (2 carb servings).   cup light yogurt (1 carb serving).  1 cup blueberries (1 carb serving).   cup almonds. Snack  1 large apple (2 carb servings).  1 low-fat string cheese stick. Lunch  Chicken breast salad.  1 cup spinach.   cup chopped tomatoes.  2 oz chicken breast, sliced.  2 tbs low-fat [place] dressing.  12 whole-wheat crackers (2 carb servings).  12 to 15 grapes (1 carb serving).  1 cup low-fat milk (1 carb serving). Snack  1 cup carrots.   cup hummus (1 carb serving). Dinner  3 oz broiled salmon.  1 cup brown rice (3 carb servings). Snack  1  cups steamed broccoli (1 carb serving) drizzled with 1 tsp olive oil and lemon juice.  1 cup light pudding (2 carb servings). DIABETES MEAL PLANNING WORKSHEET Your dietician can use this worksheet to help you decide how many servings of foods and what types of foods are right for you.  BREAKFAST Food Group and Servings / Carb Servings Grain/Starches __________________________________ Dairy __________________________________________ Vegetable ______________________________________ Fruit ___________________________________________ Meat __________________________________________ Fat ____________________________________________ LUNCH Food Group and Servings / Carb Servings Grain/Starches ___________________________________ Dairy ___________________________________________ Fruit ____________________________________________ Meat ___________________________________________ Fat _____________________________________________ [name] Food Group and Servings / Carb Servings Grain/Starches ___________________________________ Dairy  ___________________________________________ Fruit ____________________________________________ Meat ___________________________________________ Fat _____________________________________________ SNACKS Food Group and Servings / Carb Servings Grain/Starches ___________________________________ Dairy ___________________________________________ Vegetable _______________________________________ Fruit ____________________________________________ Meat ___________________________________________ Fat _____________________________________________ DAILY TOTALS Starches _________________________ Vegetable ________________________ Fruit ____________________________ Dairy ____________________________ Meat ____________________________ Fat ______________________________ Document Released: 04/20/2005 Document Revised: 10/16/2011 Document Reviewed: 03/01/2009 [employer] Patient Information 2014 [employer]. Diet for Gastroesophageal Reflux Disease, Adult Reflux is when stomach acid flows up into the esophagus. The esophagus becomes irritated and sore (inflammation). When reflux happens often and is severe, it is called gastroesophageal reflux disease (GERD). What you eat can help ease any discomfort caused by GERD. FOODS OR DRINKS TO AVOID OR LIMIT  Coffee and black tea, with or without caffeine.  Bubbly (carbonated) drinks with caffeine or energy drinks.  Strong spices, such as pepper, cayenne pepper, curry, or chili powder.  Peppermint or spearmint.  Chocolate.  High-fat foods, such as meats, fried food, oils, butter, or nuts.  Fruits and vegetables that cause discomfort. This includes citrus fruits and tomatoes.  Alcohol. If a certain food or drink irritates your GERD, avoid eating or drinking it. THINGS THAT MAY HELP GERD INCLUDE:  Eat meals slowly.  Eat 5 to 6 small meals a day, not 3 large meals.  Do not eat food for a certain amount of time if it causes discomfort.  Wait 3  hours after eating before lying down.  Keep the head of your bed raised 6 to 9 inches (15  23 centimeters). Put a foam wedge or blocks under the legs of the bed.  Stay active. Weight loss, if needed, may help ease your discomfort.  Wear loose-fitting clothing.  Do not smoke or chew tobacco. Document Released: 01/23/2012 Document Reviewed: 01/23/2012 [facility] Patient Information 2014 [place].

## 2013-10-30 NOTE — Progress Notes (Signed)
Subjective:    Patient ID: [name], female    DOB: 13-Aug-1927, 80 y.o.   MRN: [id number]  HPI Comments: 79 yo pleasant female for f/u with recent diagnosis of pancreatic cancer with subsequent new DX DM. She has been doing well overall. She notes it is difficult to eat with nausea but she is supplementing with glucerna. Her BS range from 120 - 200. She is doing ok with the Metformin. She had recent labs rechecked with mild increase in LFTs from recent drop after stent placed. She denies any abdomen pain at this time. She will start Chemo on Monday. She has noted mild increase with reflux and has not seen improvement with OTC increase.     Current Outpatient Prescriptions on File Prior to Visit  Medication Sig Dispense Refill  . Blood Glucose Monitoring Suppl (BLOOD GLUCOSE MONITOR KIT) KIT by Does not apply route. relion brand      . Cholecalciferol (VITAMIN D-3) 5000 UNITS TABS Take by mouth daily.       Marland Kitchen estradiol (ESTRING) 2 MG vaginal ring Place 2 mg vaginally every 3 (three) months. follow package directions  1 each  3  . Flaxseed, Linseed, (FLAXSEED OIL PO) Take 5 mLs by mouth daily.       . hydrochlorothiazide (HYDRODIURIL) 25 MG tablet Take 25 mg by mouth daily as needed (swelling).       Marland Kitchen levothyroxine (SYNTHROID, LEVOTHROID) 100 MCG tablet Take 50-100 mcg by mouth See admin instructions. Pt takes 1/2 tab for 50 mcg dose on Tuesday,thursday,saturday,sunday. 100 mcg  On Monday, Wednesday,friday      . Magnesium 250 MG TABS Take 250 mg by mouth daily.       . metFORMIN (GLUCOPHAGE-XR) 500 MG 24 hr tablet Take 1,000 mg by mouth 2 (two) times daily.      . metoprolol succinate (TOPROL-XL) 25 MG 24 hr tablet Take 25 mg by mouth daily.      . Multiple Vitamin (MULTIVITAMIN) tablet Take 1 tablet by mouth daily.      . Omega-3 Fatty Acids (FISH OIL) 1000 MG CAPS Take 1,000 mg by mouth daily.       Marland Kitchen omeprazole (PRILOSEC) 20 MG capsule Take 20 mg by mouth 2 (two) times daily.        . ondansetron (ZOFRAN) 8 MG tablet Take 1 tablet (8 mg total) by mouth 2 (two) times daily as needed (Nausea or vomiting).  30 tablet  1  . prochlorperazine (COMPAZINE) 10 MG tablet Take 1 tablet (10 mg total) by mouth every 6 (six) hours as needed (Nausea or vomiting).  30 tablet  1  . sucralfate (CARAFATE) 1 G tablet Take 1 tablet (1 g total) by mouth 2 (two) times daily.  60 tablet  5  . TRUEPLUS LANCETS 33G MISC        No current facility-administered medications on file prior to visit.   Allergies  Allergen Reactions  . Aspirin     GI upset  . Gemfibrozil   . Nsaids Other (See Comments)    "irritates my stomach"  . Pravastatin     Muscle aches  . Pse Hcl-Dm Hbr-Guaifenesin Tan     Palpitations   Past Medical History  Diagnosis Date  . Arthritis     Left knee, s/p cortisone injection  . GERD (gastroesophageal reflux disease)   . Complication of anesthesia     Very hard to wake up after anesthesia  . Dizziness   . Seasonal  allergies   . Frequency of urination   . Dysrhythmia     Sinus tachycardia can get up to 108. Started after chemo   . Hypertension   . Hyperlipidemia   . Thyroid disease   . Hypothyroidism   . Breast cancer 2001    S/P mastectomy, XRT and chemo  . Cancer 10/07/13    pancreatic cancer/adenocarcinoma    Review of Systems  Constitutional: Positive for appetite change.  Gastrointestinal: Positive for nausea.  All other systems reviewed and are negative.   BP 104/62  Pulse 98  Temp(Src) 98.4 F (36.9 C) (Temporal)  Resp 16  Ht _0  (1.549 m)  Wt 110 lb (49.896 kg)  BMI 20.80 kg/m2     Objective:   Physical Exam  Nursing note and vitals reviewed. Constitutional: She is oriented to person, place, and time. She appears well-developed and well-nourished.  HENT:  Head: Normocephalic and atraumatic.  Eyes: Conjunctivae are normal.  Neck: Normal range of motion.  Cardiovascular: Normal rate, regular rhythm, normal heart sounds and intact  distal pulses.   Pulmonary/Chest: Effort normal and breath sounds normal.  Abdominal: Soft. Bowel sounds are normal. She exhibits no distension and no mass. There is no tenderness. There is no rebound and no guarding.  Musculoskeletal: Normal range of motion.  Neurological: She is alert and oriented to person, place, and time.  Skin: Skin is warm and dry.  Psychiatric: She has a normal mood and affect. Judgment normal.          Assessment & Plan:  1. DM- Continue to monitor with current regimen, stressed importance of eating small ever 2 hours to avoid hypoglycemia. W/c with results  2. GERD- Stop OTC and trial of Dexilant 1 QD SX #20 given, GERD Diet discussed, w/c with results

## 2013-11-03 ENCOUNTER — Other Ambulatory Visit: Payer: Self-pay | Admitting: Internal Medicine

## 2013-11-03 ENCOUNTER — Ambulatory Visit
Admission: RE | Admit: 2013-11-03 | Discharge: 2013-11-03 | Disposition: A | Discharge status: Home / Self Care | Payer: Medicare Other | Source: Ambulatory Visit | Attending: Radiation Oncology | Admitting: Radiation Oncology

## 2013-11-03 ENCOUNTER — Ambulatory Visit (HOSPITAL_BASED_OUTPATIENT_CLINIC_OR_DEPARTMENT_OTHER): Payer: Medicare Other

## 2013-11-03 ENCOUNTER — Other Ambulatory Visit: Payer: Medicare Other

## 2013-11-03 ENCOUNTER — Other Ambulatory Visit (HOSPITAL_BASED_OUTPATIENT_CLINIC_OR_DEPARTMENT_OTHER): Payer: Medicare Other

## 2013-11-03 VITALS — BP 107/67 | HR 105 | Temp 97.1°F | Resp 18

## 2013-11-03 DIAGNOSIS — C250 Malignant neoplasm of head of pancreas: Secondary | ICD-10-CM

## 2013-11-03 DIAGNOSIS — C259 Malignant neoplasm of pancreas, unspecified: Secondary | ICD-10-CM

## 2013-11-03 DIAGNOSIS — Z5111 Encounter for antineoplastic chemotherapy: Secondary | ICD-10-CM

## 2013-11-03 LAB — CBC WITH DIFFERENTIAL/PLATELET
BASO%: 0.8 % (ref 0.0–2.0)
Basophils Absolute: 0.1 10*3/uL (ref 0.0–0.1)
EOS%: 3.9 % (ref 0.0–7.0)
Eosinophils Absolute: 0.3 10*3/uL (ref 0.0–0.5)
HCT: 37.6 % (ref 34.8–46.6)
HGB: 12.8 g/dL (ref 11.6–15.9)
LYMPH%: 22.2 % (ref 14.0–49.7)
MCH: 32.1 pg (ref 25.1–34.0)
MCHC: 34 g/dL (ref 31.5–36.0)
MCV: 94.2 fL (ref 79.5–101.0)
MONO#: 0.7 10*3/uL (ref 0.1–0.9)
MONO%: 10.7 % (ref 0.0–14.0)
NEUT#: 4 10*3/uL (ref 1.5–6.5)
NEUT%: 62.4 % (ref 38.4–76.8)
NRBC: 0 % (ref 0–0)
Platelets: 284 10*3/uL (ref 145–400)
RBC: 3.99 10*6/uL (ref 3.70–5.45)
RDW: 14.2 % (ref 11.2–14.5)
WBC: 6.4 10*3/uL (ref 3.9–10.3)
lymph#: 1.4 10*3/uL (ref 0.9–3.3)

## 2013-11-03 LAB — COMPREHENSIVE METABOLIC PANEL (CC13)
ALBUMIN: 3.5 g/dL (ref 3.5–5.0)
ALT: 84 U/L — AB (ref 0–55)
AST: 57 U/L — AB (ref 5–34)
Alkaline Phosphatase: 402 U/L — ABNORMAL HIGH (ref 40–150)
Anion Gap: 13 mEq/L — ABNORMAL HIGH (ref 3–11)
BUN: 17.3 mg/dL (ref 7.0–26.0)
CO2: 26 mEq/L (ref 22–29)
Calcium: 9.8 mg/dL (ref 8.4–10.4)
Chloride: 99 mEq/L (ref 98–109)
Creatinine: 0.8 mg/dL (ref 0.6–1.1)
Glucose: 160 mg/dl — ABNORMAL HIGH (ref 70–140)
Potassium: 4 mEq/L (ref 3.5–5.1)
Sodium: 137 mEq/L (ref 136–145)
Total Bilirubin: 0.77 mg/dL (ref 0.20–1.20)
Total Protein: 6.9 g/dL (ref 6.4–8.3)

## 2013-11-03 MED ORDER — PROCHLORPERAZINE MALEATE 10 MG PO TABS: 10.0000 mg | ORAL_TABLET | Freq: Once | ORAL | Status: DC

## 2013-11-03 MED ORDER — SODIUM CHLORIDE 0.9 % IV SOLN
Freq: Once | INTRAVENOUS | Status: AC
  Administered 2013-11-03: 15:00:00 via INTRAVENOUS

## 2013-11-03 MED ORDER — SODIUM CHLORIDE 0.9 % IV SOLN
500.0000 mg/m2 | Freq: Once | INTRAVENOUS | Status: AC
  Administered 2013-11-03: 722 mg via INTRAVENOUS
  Filled 2013-11-03: qty 18.99

## 2013-11-03 NOTE — Progress Notes (Signed)
Gemzar complete without difficulty. Patient discharged to lobby for Rad Onc in stable condition via wheelchair with husband.

## 2013-11-03 NOTE — Patient Instructions (Signed)
[facility] Discharge Instructions for Patients Receiving Chemotherapy  Today you received the following chemotherapy agents GEMZAR To help prevent nausea and vomiting after your treatment, we encourage you to take your nausea medication COMPAZINE  Or ZOFRAN  If you develop nausea and vomiting that is not controlled by your nausea medication, call the clinic.   BELOW ARE SYMPTOMS THAT SHOULD BE REPORTED IMMEDIATELY:  *FEVER GREATER THAN 100.5 F  *CHILLS WITH OR WITHOUT FEVER  NAUSEA AND VOMITING THAT IS NOT CONTROLLED WITH YOUR NAUSEA MEDICATION  *UNUSUAL SHORTNESS OF BREATH  *UNUSUAL BRUISING OR BLEEDING  TENDERNESS IN MOUTH AND THROAT WITH OR WITHOUT PRESENCE OF ULCERS  *URINARY PROBLEMS  *BOWEL PROBLEMS  UNUSUAL RASH Items with * indicate a potential emergency and should be followed up as soon as possible.  Feel free to call the clinic you have any questions or concerns. The clinic phone number is [phone].

## 2013-11-04 ENCOUNTER — Ambulatory Visit
Admission: RE | Admit: 2013-11-04 | Discharge: 2013-11-04 | Disposition: A | Discharge status: Home / Self Care | Payer: Medicare Other | Source: Ambulatory Visit | Attending: Radiation Oncology | Admitting: Radiation Oncology

## 2013-11-04 DIAGNOSIS — C250 Malignant neoplasm of head of pancreas: Secondary | ICD-10-CM

## 2013-11-04 LAB — CANCER ANTIGEN 19-9: CA 19-9: 390.4 U/mL — ABNORMAL HIGH (ref <35.0)

## 2013-11-04 MED ORDER — RADIAPLEXRX EX GEL
Freq: Once | CUTANEOUS | Status: AC
  Administered 2013-11-04: 18:00:00 via TOPICAL

## 2013-11-04 NOTE — Progress Notes (Signed)
Education completed today, with [name], related to radiation therapy to her pancreatic region.  Reviewed management of pain, nausea, diarrhea(potential), fatigue, and skin.  Given Radiaplex Gel with instructions to apply to her abdomen BID, in the morning and in the evening after treatment.  Suggested Dove sensitive skin soap and given instructions to not apply other lotions, creams, powders nor perfume in the abdominal region while receiving radiation.  Informed that her Dr. Lisbeth Franklin sees his patients every Thursday following treatment, but if this changes she will be notified in advance.  She stated understanding.  Teach Back method

## 2013-11-05 ENCOUNTER — Ambulatory Visit
Admission: RE | Admit: 2013-11-05 | Discharge: 2013-11-05 | Disposition: A | Discharge status: Home / Self Care | Payer: Medicare Other | Source: Ambulatory Visit | Attending: Radiation Oncology | Admitting: Radiation Oncology

## 2013-11-05 ENCOUNTER — Telehealth: Payer: Self-pay | Admitting: *Deleted

## 2013-11-05 ENCOUNTER — Ambulatory Visit: Payer: Medicare Other | Admitting: Radiation Oncology

## 2013-11-05 ENCOUNTER — Ambulatory Visit: Payer: Medicare Other | Admitting: Internal Medicine

## 2013-11-05 NOTE — Telephone Encounter (Signed)
Left VM for patient to call triage with update on status post 1st chemo on 11/03/13.

## 2013-11-05 NOTE — Telephone Encounter (Signed)
Message copied by Tania Ade on Wed Nov 05, 2013 11:03 AM ------      Message from: Ardeen Garland      Created: Mon Nov 03, 2013  3:55 PM       First time gemzar 11/03/13 left lower  arm      radiation at 4 pm tomorrow-      [phone] ------

## 2013-11-06 ENCOUNTER — Ambulatory Visit
Admission: RE | Admit: 2013-11-06 | Discharge: 2013-11-06 | Disposition: A | Discharge status: Home / Self Care | Payer: Medicare Other | Source: Ambulatory Visit | Attending: Radiation Oncology | Admitting: Radiation Oncology

## 2013-11-06 VITALS — BP 123/68 | HR 108 | Temp 97.5°F | Ht 61.0 in | Wt 111.3 lb

## 2013-11-06 DIAGNOSIS — C250 Malignant neoplasm of head of pancreas: Secondary | ICD-10-CM

## 2013-11-06 NOTE — Progress Notes (Signed)
  Radiation Oncology         [phone] ________________________________  Name: [name] MRN: [id number]  Date: 11/06/2013  DOB: 01/26/1928  Weekly Radiation Therapy Management  Current Dose: 7.2 Gy     Planned Dose:  50.4 Gy  Narrative . . . . . . . . The patient presents for routine under treatment assessment.                                   The patient is without complaint. Earlier in the week the patient did have some nausea and emesis. I did discuss with the patient that she should take her nausea medicine  1hour prior to her radiation therapy if this continues to be a  Re-occurring issue.  She is receiving radiosensitizing chemotherapy on a weekly basis.                                 Set-up films were reviewed.                                 The chart was checked. Physical Findings. . .  height is 5\' 1"  (1.549 m) and weight is 111 lb 4.8 oz (50.485 kg). Her temperature is 97.5 F (36.4 C). Her blood pressure is 123/68 and her pulse is 108. Her oxygen saturation is 98%. . Weight essentially stable.  The lungs are clear. The heart has a regular rhythm and rate. The abdomen is soft and nontender with normal bowel sounds. Impression . . . . . . . The patient is tolerating radiation. Plan . . . . . . . . . . . . Continue treatment as planned.  ________________________________   Blair Promise, PhD, MD

## 2013-11-06 NOTE — Progress Notes (Signed)
[name] has had 4 fractions to her pancreas.  She denies pain.  She does report vomiting on Monday after chemo and radiation due to eating too much.  She had queasiness yesterday morning and took Zofran which helped.  She reports a poor appetite and is trying to eat more, smaller meals.  She is fatigued.  She is using radiaplex gel on her abdomen.

## 2013-11-07 ENCOUNTER — Ambulatory Visit
Admission: RE | Admit: 2013-11-07 | Discharge: 2013-11-07 | Disposition: A | Discharge status: Home / Self Care | Payer: Medicare Other | Source: Ambulatory Visit | Attending: Radiation Oncology | Admitting: Radiation Oncology

## 2013-11-10 ENCOUNTER — Ambulatory Visit (HOSPITAL_BASED_OUTPATIENT_CLINIC_OR_DEPARTMENT_OTHER): Payer: Medicare Other

## 2013-11-10 ENCOUNTER — Other Ambulatory Visit: Payer: Self-pay | Admitting: *Deleted

## 2013-11-10 ENCOUNTER — Encounter: Payer: Self-pay | Admitting: Physician Assistant

## 2013-11-10 ENCOUNTER — Other Ambulatory Visit: Payer: Self-pay | Admitting: Internal Medicine

## 2013-11-10 ENCOUNTER — Ambulatory Visit (HOSPITAL_BASED_OUTPATIENT_CLINIC_OR_DEPARTMENT_OTHER): Payer: Medicare Other | Admitting: Physician Assistant

## 2013-11-10 ENCOUNTER — Other Ambulatory Visit (HOSPITAL_BASED_OUTPATIENT_CLINIC_OR_DEPARTMENT_OTHER): Payer: Medicare Other

## 2013-11-10 ENCOUNTER — Ambulatory Visit
Admission: RE | Admit: 2013-11-10 | Discharge: 2013-11-10 | Disposition: A | Discharge status: Home / Self Care | Payer: Medicare Other | Source: Ambulatory Visit | Attending: Radiation Oncology | Admitting: Radiation Oncology

## 2013-11-10 VITALS — BP 120/49 | HR 102 | Temp 97.8°F | Resp 18 | Ht 61.0 in | Wt 110.9 lb

## 2013-11-10 DIAGNOSIS — C259 Malignant neoplasm of pancreas, unspecified: Secondary | ICD-10-CM

## 2013-11-10 DIAGNOSIS — C250 Malignant neoplasm of head of pancreas: Secondary | ICD-10-CM

## 2013-11-10 DIAGNOSIS — Z5111 Encounter for antineoplastic chemotherapy: Secondary | ICD-10-CM

## 2013-11-10 DIAGNOSIS — Z809 Family history of malignant neoplasm, unspecified: Secondary | ICD-10-CM

## 2013-11-10 DIAGNOSIS — R740 Nonspecific elevation of levels of transaminase and lactic acid dehydrogenase [LDH]: Secondary | ICD-10-CM

## 2013-11-10 DIAGNOSIS — R7402 Elevation of levels of lactic acid dehydrogenase (LDH): Secondary | ICD-10-CM

## 2013-11-10 LAB — BASIC METABOLIC PANEL (CC13)
ANION GAP: 13 meq/L — AB (ref 3–11)
BUN: 17.9 mg/dL (ref 7.0–26.0)
CO2: 25 mEq/L (ref 22–29)
Calcium: 9.6 mg/dL (ref 8.4–10.4)
Chloride: 96 mEq/L — ABNORMAL LOW (ref 98–109)
Creatinine: 0.8 mg/dL (ref 0.6–1.1)
Glucose: 182 mg/dl — ABNORMAL HIGH (ref 70–140)
POTASSIUM: 4 meq/L (ref 3.5–5.1)
SODIUM: 135 meq/L — AB (ref 136–145)

## 2013-11-10 LAB — CBC WITH DIFFERENTIAL/PLATELET
BASO%: 2.1 % — AB (ref 0.0–2.0)
Basophils Absolute: 0.1 10*3/uL (ref 0.0–0.1)
EOS%: 2.8 % (ref 0.0–7.0)
Eosinophils Absolute: 0.1 10*3/uL (ref 0.0–0.5)
HEMATOCRIT: 35.6 % (ref 34.8–46.6)
HGB: 12.2 g/dL (ref 11.6–15.9)
LYMPH#: 0.6 10*3/uL — AB (ref 0.9–3.3)
LYMPH%: 22.3 % (ref 14.0–49.7)
MCH: 31.9 pg (ref 25.1–34.0)
MCHC: 34.3 g/dL (ref 31.5–36.0)
MCV: 93 fL (ref 79.5–101.0)
MONO#: 0.4 10*3/uL (ref 0.1–0.9)
MONO%: 12.8 % (ref 0.0–14.0)
NEUT#: 1.7 10*3/uL (ref 1.5–6.5)
NEUT%: 60 % (ref 38.4–76.8)
NRBC: 0 % (ref 0–0)
Platelets: 226 10*3/uL (ref 145–400)
RBC: 3.83 10*6/uL (ref 3.70–5.45)
RDW: 13.5 % (ref 11.2–14.5)
WBC: 2.8 10*3/uL — AB (ref 3.9–10.3)

## 2013-11-10 MED ORDER — SODIUM CHLORIDE 0.9 % IV SOLN
Freq: Once | INTRAVENOUS | Status: AC
  Administered 2013-11-10: 14:00:00 via INTRAVENOUS

## 2013-11-10 MED ORDER — SODIUM CHLORIDE 0.9 % IV SOLN
500.0000 mg/m2 | Freq: Once | INTRAVENOUS | Status: AC
  Administered 2013-11-10: 722 mg via INTRAVENOUS
  Filled 2013-11-10: qty 18.99

## 2013-11-10 MED ORDER — PROCHLORPERAZINE MALEATE 10 MG PO TABS: 10.0000 mg | ORAL_TABLET | Freq: Once | ORAL | Status: DC

## 2013-11-10 NOTE — Progress Notes (Signed)
Ok to treat WBC 2.8 per Awilda Metro.

## 2013-11-10 NOTE — Patient Instructions (Signed)
[facility] Discharge Instructions for Patients Receiving Chemotherapy  Today you received the following chemotherapy agents gemzar  To help prevent nausea and vomiting after your treatment, we encourage you to take your nausea medication as directed.  If you develop nausea and vomiting that is not controlled by your nausea medication, call the clinic.   BELOW ARE SYMPTOMS THAT SHOULD BE REPORTED IMMEDIATELY:  *FEVER GREATER THAN 100.5 F  *CHILLS WITH OR WITHOUT FEVER  NAUSEA AND VOMITING THAT IS NOT CONTROLLED WITH YOUR NAUSEA MEDICATION  *UNUSUAL SHORTNESS OF BREATH  *UNUSUAL BRUISING OR BLEEDING  TENDERNESS IN MOUTH AND THROAT WITH OR WITHOUT PRESENCE OF ULCERS  *URINARY PROBLEMS  *BOWEL PROBLEMS  UNUSUAL RASH Items with * indicate a potential emergency and should be followed up as soon as possible.  Feel free to call the clinic you have any questions or concerns. The clinic phone number is [phone].  

## 2013-11-10 NOTE — Progress Notes (Signed)
[facility] OFFICE PROGRESS NOTE  [name], MD [address] [place] [address]  DIAGNOSIS: Cancer of head of pancreas - Plan: CBC with Differential, Comprehensive metabolic panel (Cmet) - [facility]  No chief complaint on file.   CURRENT TREATMENT: Planning neoadjuvant chemoradiation starting on 03/30 - 12/10/2013.  Gemcitabine 500 mg/m2 weekly with concurrent XRT.      Pancreatic cancer   10/03/2013 Imaging CT of abdomen: Mass within the head and uncinate process of the pancreas obstructs both the common bile duct and pancreatic duct.  No evidence of mets   10/03/2013 - 10/05/2013 Hospital Admission Admitted to hospitalist team at [facility] hospital due to bstructive jaundice and transaminitis secondary to pancreatic head mass noted on outpatient CT abdomen and pelvis. GI consulted.    10/04/2013 Procedure ERCP (Dr. Elnoria Howard) performed.  Malignant CBD obstruction with brushings obtained. EUS planned.    10/04/2013 Pathology Diagnosis BILE DUCT BRUSHING, COMMON (SPECIMEN 1 OF 1 COLLECTED 10-04-2013)  ATYPICAL CELLS, HIGHLY SUSPICIOUS FOR MALIGNANCY.   10/04/2013 Tumor Marker CA 19 9, 640.8   10/07/2013 Procedure EUS (Dr. Elnoria Howard) performed revealing a 2.9 x 3.3 cm pancreatic head mass without portal vein invasion, dilated CBC to 1.5 cm with plastic stent, dilated PD to 1 cm. FNA obtained.   10/07/2013 Pathology FINE NEEDLE ASPIRATION, ENDOSCOPIC, PANCREAS HEAD(SPECIMEN 1 OF 1 COLLECTED 10/07/13): MALIGNANT CELLS CONSISTENT WITH ADENOCARCINOMA.   10/07/2013 Initial Diagnosis Pancreatic cancer   10/14/2013 Cancer Staging Seen by Dr. Donell Beers who recommends chemoradiation. Referred to oncology and radiation oncology. CT of chest ordered to complete staging.    10/14/2013 Family History referring her to genetics since she has had breast cancer, and has significant family history of breast cancer and prostate cancer   10/17/2013 Imaging CT of Chest: Negative for lung metastases.     INTERVAL HISTORY: [name] 80 y.o. female with a history of breast cancer s/p mastectomy of right breast who was referred by Dr. Donell Beers of Surgical oncology for further evaluation of her newly diagnosed pancreatic adenocarcinoma. Today, she is accompanied by a family member. Overall she's tolerating her weekly gemcitabine and radiation therapy relatively well. She's completed one week. She did have some issues with nausea and vomiting. She has change the way that she is taking her Compazine her some recommendations from radiation therapy and this has helped tremendously. She's had no fever or chills and no issues with diarrhea. Her blood sugars have been running low, between 97 and 123 and she has felt faint and these numbers. She plans to call Efraim Kaufmann, the physician assistant at her primary care doctor's office to discuss changes with her diabetes medications. She voices no other specific complaints today  As previously noted, she was evaluated by Dr. Donell Beers for consideration of surgery for her localized pancreatic cancer  and recommended chemoradiation while the patient determines if she wants to proceed given the extent of the surgery required. She was seen by Dr. Mitzi Hansen on 03/18 and determined to be a good candidate for chemoradiation.  She had a CT of chest which was negative.   Patient was driving a few months ago and remains independent of her basic, intermediate and advanced activities of daily of living. She has a notable family history of cancer. Her daughter had breast cancer at age 36. Her sister passed away from breast cancer with metastatic disease to the bone. She met with the genetic Counselor on 10/28/2013. She gave consent  To pursue genetic testing and a  blood sample was sent to [facility] for analysis of the breast/ovarian cancer panel. Results are pending.   MEDICAL HISTORY: Past Medical History  Diagnosis Date  . Arthritis     Left knee, s/p cortisone injection   . GERD (gastroesophageal reflux disease)   . Complication of anesthesia     Very hard to wake up after anesthesia  . Dizziness   . Seasonal allergies   . Frequency of urination   . Dysrhythmia     Sinus tachycardia can get up to 108. Started after chemo   . Hypertension   . Hyperlipidemia   . Thyroid disease   . Hypothyroidism   . Breast cancer 2001    S/P mastectomy, XRT and chemo  . Cancer 10/07/13    pancreatic cancer/adenocarcinoma    INTERIM HISTORY: has Hypertension; Hypothyroidism; GERD (gastroesophageal reflux disease); Cholelithiasis with choledocholithiasis; Hyperlipidemia; Breast cancer; Encounter for long-term (current) use of other medications; Transaminitis; T2 NIDDM; Pancreatic cancer; and Cancer of head of pancreas on her problem list.    ALLERGIES:  is allergic to aspirin; gemfibrozil; nsaids; pravastatin; and pse hcl-dm hbr-guaifenesin tan.  MEDICATIONS: has a current medication list which includes the following prescription(s): blood glucose monitor kit, vitamin d-3, estradiol, flaxseed (linseed), hyaluronate sodium, levothyroxine, magnesium, metformin, metoprolol succinate, multivitamin, fish oil, omeprazole, ondansetron, sucralfate, trueplus lancets 33g, hydrochlorothiazide, and prochlorperazine.  SURGICAL HISTORY:  Past Surgical History  Procedure Laterality Date  . Joint replacement      right knee  . Carpal tunnel release      bilateral  . Incontinence surgery    . Cataract extraction      left  . Mastectomy      right due to cancer  . Ercp  10/06/2011    Procedure: ENDOSCOPIC RETROGRADE CHOLANGIOPANCREATOGRAPHY (ERCP);  Surgeon: Gatha Mayer, MD;  Location: [facility];  Service: Endoscopy;  Laterality: N/A;  or general per anesthesia  . Sphincterotomy  10/06/2011    Procedure: SPHINCTEROTOMY;  Surgeon: Gatha Mayer, MD;  Location: [facility];  Service: Endoscopy;;  . Cholecystectomy  10/07/2011    Procedure: LAPAROSCOPIC CHOLECYSTECTOMY WITH  INTRAOPERATIVE CHOLANGIOGRAM;  Surgeon: Edward Jolly, MD;  Location: [facility];  Service: General;  Laterality: N/A;  . Tonsillectomy    . Colonoscopy    . Eye surgery    . Lumbar laminectomy/decompression microdiscectomy  05/27/2012    Procedure: LUMBAR LAMINECTOMY/DECOMPRESSION MICRODISCECTOMY 1 LEVEL;  Surgeon: Ophelia Charter, MD;  Location: [facility];  Service: Neurosurgery;  Laterality: Bilateral;  Lumbar four laminectomy, with bilateral lumbar three laminotomies.  . Ercp N/A 10/04/2013    Procedure: ENDOSCOPIC RETROGRADE CHOLANGIOPANCREATOGRAPHY (ERCP);  Surgeon: Beryle Beams, MD;  Location: [facility];  Service: Gastroenterology;  Laterality: N/A;  . Eus N/A 10/07/2013    Procedure: UPPER ENDOSCOPIC ULTRASOUND (EUS) LINEAR;  Surgeon: Beryle Beams, MD;  Location: [facility];  Service: Endoscopy;  Laterality: N/A;    REVIEW OF SYSTEMS:   Constitutional: Denies fevers, chills or abnormal weight loss Eyes: Denies blurriness of vision Ears, nose, mouth, throat, and face: Denies mucositis or sore throat Respiratory: Denies cough, dyspnea or wheezes Cardiovascular: Denies palpitation, chest discomfort or lower extremity swelling Gastrointestinal:  Positive for nausea and vomiting, denies heartburn or change in bowel habits Skin: Denies abnormal skin rashes Lymphatics: Denies new lymphadenopathy or easy bruising Neurological:Denies numbness, tingling or new weaknesses Behavioral/Psych: Mood is stable, no new changes  All other systems were reviewed with the patient and are negative.  PHYSICAL  EXAMINATION: ECOG PERFORMANCE STATUS: 0 - Asymptomatic  Blood pressure 120/49, pulse 102, temperature 97.8 F (36.6 C), temperature source Oral, resp. rate 18, height 5\' 1"  (1.549 m), weight 110 lb 14.4 oz (50.304 kg), SpO2 97.00%.  GENERAL:alert, no distress and comfortable;easily gets to the exam table. Ambulates with cane secondary to right knee prior surgery.  SKIN: skin color,  texture, turgor are normal, no rashes or significant lesions EYES: normal, Conjunctiva are pink and non-injected, sclera clear OROPHARYNX:no exudate, no erythema and lips, buccal mucosa, and tongue normal  NECK: supple, thyroid normal size, non-tender, without nodularity LYMPH:  no palpable lymphadenopathy in the cervical, axillary or supraclavicular LUNGS: clear to auscultation with normal breathing effort, no wheezes or rhonchi HEART: regular rate & rhythm and no murmurs and no lower extremity edema ABDOMEN:abdomen soft, non-tender and normal bowel sounds Musculoskeletal:no cyanosis of digits and no clubbing  NEURO: alert & oriented x 3 with fluent speech, no focal motor/sensory deficits  Labs:  Lab Results  Component Value Date   WBC 2.8* 11/10/2013   HGB 12.2 11/10/2013   HCT 35.6 11/10/2013   MCV 93.0 11/10/2013   PLT 226 11/10/2013   NEUTROABS 1.7 11/10/2013      Chemistry      Component Value Date/Time   NA 135* 11/10/2013 1153   NA 132* 10/15/2013 1313   K 4.0 11/10/2013 1153   K 4.3 10/15/2013 1313   CL 94* 10/15/2013 1313   CO2 25 11/10/2013 1153   CO2 26 10/15/2013 1313   BUN 17.9 11/10/2013 1153   BUN 15 10/15/2013 1313   CREATININE 0.8 11/10/2013 1153   CREATININE 0.69 10/15/2013 1313   CREATININE 0.73 10/05/2013 0423      Component Value Date/Time   CALCIUM 9.6 11/10/2013 1153   CALCIUM 9.7 10/15/2013 1313   ALKPHOS 402* 11/03/2013 1306   ALKPHOS 493* 10/15/2013 1313   AST 57* 11/03/2013 1306   AST 30 10/15/2013 1313   ALT 84* 11/03/2013 1306   ALT 116* 10/15/2013 1313   BILITOT 0.77 11/03/2013 1306   BILITOT 1.5* 10/15/2013 1313      CBC:  Recent Labs Lab 11/10/13 1147  WBC 2.8*  NEUTROABS 1.7  HGB 12.2  HCT 35.6  MCV 93.0  PLT 226   Studies:  No results found.   RADIOGRAPHIC STUDIES: Dg Chest 2 View  10/01/2013   CLINICAL DATA:  Abdominal discomfort, weakness  EXAM: CHEST  2 VIEW  COMPARISON:  05/22/2012  FINDINGS: Cardiomediastinal silhouette is stable. Again noted  status post right mastectomy. Degenerative changes and levoscoliosis lumbar spine. Stable degenerative changes lower thoracic spine. Mild hyperinflation again noted. No acute infiltrate or pleural effusion. No pulmonary edema.  IMPRESSION: No active disease. Status post right mastectomy. Lumbar levoscoliosis.   Electronically Signed   By: 05/24/2012 M.D.   On: 10/01/2013 15:12   Ct Chest W Contrast  10/20/2013   CLINICAL DATA:  Pancreatic cancer. Evaluate metastatic disease. History of right breast cancer.  EXAM: CT CHEST WITH CONTRAST  TECHNIQUE: Multidetector CT imaging of the chest was performed during intravenous contrast administration.  CONTRAST:  45mL OMNIPAQUE IOHEXOL 300 MG/ML  SOLN  COMPARISON:  CT ABD/PELVIS W CM dated 10/03/2013  FINDINGS: No pathologically enlarged mediastinal, hilar or axillary lymph nodes. No internal mammary adenopathy. Heart size normal. Three-vessel coronary artery calcification. No pericardial effusion.  Biapical pleural parenchymal scarring. Mild subpleural radiation fibrosis in the anterior right hemi thorax. Findings are worst in the right middle lobe. Mild scattered  scarring. Lungs are otherwise clear. No pleural fluid. Airway is unremarkable.  Incidental imaging of the upper abdomen shows pneumobilia with a stent in the common bile duct. Biliary ductal dilatation is again seen and partially imaged. Marked pancreatic ductal dilatation with atrophy of the body and tail. The primary pancreatic mass in the head and uncinate process, as seen on 10/03/2013, was not imaged today. Visualized portions of the adrenal glands, kidneys, spleen and stomach are grossly unremarkable. No worrisome lytic or sclerotic lesions. Degenerative changes are seen in the spine.  IMPRESSION: 1. No evidence of metastatic disease in the chest. 2. Three-vessel coronary artery calcification. 3. Interval common bile duct stent placement with pneumobilia and persistent marked dilatation of the biliary tree  and pancreatic duct, incompletely imaged. Pancreatic mass was not imaged on the current exam. Please refer to examination of 10/03/2013 for further details.   Electronically Signed   By: Lorin Picket M.D.   On: 10/20/2013 15:40   Ct Abdomen Pelvis W Contrast  10/03/2013   CLINICAL DATA:  Mid to lower abdominal pain  EXAM: CT ABDOMEN AND PELVIS WITH CONTRAST  TECHNIQUE: Multidetector CT imaging of the abdomen and pelvis was performed using the standard protocol following bolus administration of intravenous contrast.  CONTRAST:  124mL OMNIPAQUE IOHEXOL 300 MG/ML  SOLN  COMPARISON:  05/22/2013  FINDINGS: There is no pleural or pericardial effusion identified. There is diffuse intrahepatic bile duct dilatation. The common bile duct is markedly dilated measuring up to 2.5 cm, image 24/series 2. There is a mass within the head and uncinate process of the pancreas which measures 3.1 cm, image 25/ series 602. There is not only obstruction of the common bile duct, but the pancreatic duct is also obstructed and dilated. The mass appears to touch but does not encase the superior mesenteric artery, image 29/series 2. The mass also appears to abut the posterior aspect of the portal vein, image 27/series 2. The lumen of the portal vein and superior mesenteric arteries are not compromised. The celiac artery appears uninvolved the spleen is normal. The splenic vein is patent  The adrenal glands both appear normal. Normal appearance of both kidneys. The urinary bladder appears normal. A pessary device is identified within the dome of vagina. Next  Calcified atherosclerotic disease involves the abdominal aorta. There is no aneurysm. No upper abdominal adenopathy. There is no pelvic or inguinal adenopathy identified.  The stomach is normal. The small bowel loops have a normal appearance without obstruction. The appendix is visualized and appears normal. Normal appearance of the colon.  Review of the visualized bony structures is  significant for scoliosis and multi level degenerative disc disease. No aggressive lytic or sclerotic bone lesions.  IMPRESSION: 1. Mass within the head and uncinate process of the pancreas obstructs both the common bile duct and pancreatic duct. 2. No specific features identified to suggest metastatic disease.   Electronically Signed   By: Kerby Moors M.D.   On: 10/03/2013 13:49   Dg Ercp  10/04/2013   CLINICAL DATA:  Obstructive jaundice, ERCP with stent placement  EXAM: ERCP  TECHNIQUE: Multiple spot images obtained with the fluoroscopic device and submitted for interpretation post-procedure.  COMPARISON:  CT ABD/PELVIS W CM dated 10/03/2013; DG ABD 2 VIEWS dated 10/01/2013; CT ABD/PELV WO CM dated 05/22/2013  FINDINGS: Two spot intraoperative fluoroscopic images during ERCP are provided for review.  Initial image demonstrates an ERCP probe overlying the right upper quadrant with selective cannulation of the common bile duct. There  is minimal opacification of the intrahepatic biliary system which appears moderate to severely dilated centrally as demonstrated on recently performed abdominal CT.  Subsequent image demonstrates deployment of a plastic internal biliary stent overlying the distal aspect of the common bile duct.  Post cholecystectomy.  Enteric contrast is seen within the colon.  IMPRESSION: ERCP with plastic biliary stent placement as above.  These images were submitted for radiologic interpretation only. Please see the procedural report for the amount of contrast and the fluoroscopy time utilized.   Electronically Signed   By: Sandi Mariscal M.D.   On: 10/04/2013 11:59   Dg Abd 2 Views  10/01/2013   CLINICAL DATA:  Abdominal pain  EXAM: ABDOMEN - 2 VIEW  COMPARISON:  CT ABD/PELV WO CM dated 05/22/2013; DG LUMBAR SPINE 1 VIEW dated 05/27/2012  FINDINGS: There is a paucity of bowel gas. A moderate to large amount of stool is appreciated. Severe levoscoliosis appreciated within the lumbar spine.  Atherosclerotic calcifications identified within the aorta.  IMPRESSION: Nonobstructive bowel gas pattern with large amount of stool.   Electronically Signed   By: Margaree Mackintosh M.D.   On: 10/01/2013 15:21    ASSESSMENT: [name] 80 y.o. female with a history of Cancer of head of pancreas - Plan: CBC with Differential, Comprehensive metabolic panel (Cmet) - [facility]   PLAN:  1. Pancreatic Adenocarcinoma, newly diagnosed starting chemoradiation on 11/03/2013.  --We reviewed extensively her pathology and CT of abdomen as noted above consistent with localized pancreatic adenocarcinoma. Her CT of chest is negative for metastases.  Her CA 19 9 is elevated 640.6. Her functional status is relatively good with adequate organ function. She would make a good candidate for chemoradiation. We then explained options for chemotherapy including gemcitabine alone or gemcitabine plus albumin-bound paclitaxel (Abraxane). We explained that with chemoradiation approaches, chemotherapy is used as a radiosensitizer, increasing the toxicity of radiation to the pancreatic cancer, most commonly used in the adjuvant setting. The role of neoadjuvant regimens is less defined based on randomized trials.  --Based on her age, we will consider RT (50.4 Gy in 28 fractions) plus Gemcitabine (500 mg/m2 q weekly x 6 --dosed decreased from 600 mg/mg2 in original study) followed by 5 cycles of Gemcitabine alone (1,000 mg/m2 weekly x 3 every 4 weeks). Given her age, doses may require modification.  Gemcitabine plus involved field RT was associated with an improved overall survival compared to gemcitabine alone for localized, unresectable pancreatic cancer. The reference is Loehrer, P.J., JCO, 2008, Vol 26, No 15S (May 20 Supplement, 2008), 4506). We provided the indications, benefits and side-effects of treatment and she agreed to proceed with chemoradiation.  Indications were for control of her disease and possible decreasing the tumor size  which might potentially make surgery more of an option; benefits also might include improvement in quality of life measurements and improvement in overall survival; and side-effects include but are not limited to nausea, vomiting, fevers, myelosuppression which might result in life-threatening infections, gastrointestinal disturbances, such as diarrhea and constipation.   We will start her weekly gemcitabine chemotherapy on 11/03/2013.   2. Elevated transaminases, mild.  --Likely secondary to the above. Improved, will continue to monitor labs weekly.    Bilirubin is within normal limits. No dose adjustment necessary.  If Bilirubin is greater than 1.6, we will decrease dose by 20%.   3. Family history of cancers.  --Given extensive breast cancer and other cancer in the family, a referral for genetic counseling has been made.  Results are pending  4. Follow-up.  --Patient will follow up in 2 weeks.  She will have labs weekly.   All questions were answered. The patient knows to call the clinic with any problems, questions or concerns. We can certainly see the patient much sooner if necessary.  I spent 25 minutes counseling the patient face to face. The total time spent in the appointment was 30 minutes.    Carlton Adam, PA-C 11/10/2013 5:25 PM

## 2013-11-11 ENCOUNTER — Ambulatory Visit
Admission: RE | Admit: 2013-11-11 | Discharge: 2013-11-11 | Disposition: A | Discharge status: Home / Self Care | Payer: Medicare Other | Source: Ambulatory Visit | Attending: Radiation Oncology | Admitting: Radiation Oncology

## 2013-11-11 ENCOUNTER — Telehealth: Payer: Self-pay | Admitting: Internal Medicine

## 2013-11-11 NOTE — Patient Instructions (Signed)
Continue course of weekly chemotherapy and radiation therapy as scheduled Follow up in 2 weeks for another symptom management visit Followup with your primary care provider regarding your blood sugar levels

## 2013-11-11 NOTE — Telephone Encounter (Signed)
gv adn printed appt sched and avs for pt for April adn May  °

## 2013-11-12 ENCOUNTER — Ambulatory Visit
Admission: RE | Admit: 2013-11-12 | Discharge: 2013-11-12 | Disposition: A | Discharge status: Home / Self Care | Payer: Medicare Other | Source: Ambulatory Visit | Attending: Radiation Oncology | Admitting: Radiation Oncology

## 2013-11-12 ENCOUNTER — Encounter: Payer: Self-pay | Admitting: Medical Oncology

## 2013-11-12 NOTE — Progress Notes (Signed)
Pt walked in to the infusion room and reported her left forearm was sore where her IV chemo -GEMZAR , was given on Monday 4/6. Site red along vein measures approx 2 inches and tender to touch . I instructed pt to apply warm compress to site and to monitor. Dr [name] notified and agreed with warm compresses , take tylenol and monitor . Pt instructed on same and to call for increasing redness spreading up arm or worsening pain. Pt instructed to come back tomorrow for injection room nurse to assess.

## 2013-11-13 ENCOUNTER — Ambulatory Visit
Admission: RE | Admit: 2013-11-13 | Discharge: 2013-11-13 | Disposition: A | Discharge status: Home / Self Care | Payer: Medicare Other | Source: Ambulatory Visit | Attending: Radiation Oncology | Admitting: Radiation Oncology

## 2013-11-13 ENCOUNTER — Ambulatory Visit: Payer: Medicare Other

## 2013-11-13 NOTE — Progress Notes (Signed)
[name] here to have her left arm looked at.  She has some redness at IV infusion site from Monday   She states that it isn't any worse than yesterday.  Redness seems to be about the same as yesterday and still a little tender to the touch.  Has been using warm compresses to her arm.  Only slight swelling and temperature at site.  She will continue to watch it and call if she notices any changes.  She will be here for radiation treatment tomorrow and will stop by for [place] to look at it again.

## 2013-11-14 ENCOUNTER — Ambulatory Visit: Payer: Medicare Other

## 2013-11-14 ENCOUNTER — Ambulatory Visit
Admission: RE | Admit: 2013-11-14 | Discharge: 2013-11-14 | Disposition: A | Discharge status: Home / Self Care | Payer: Medicare Other | Source: Ambulatory Visit | Attending: Radiation Oncology | Admitting: Radiation Oncology

## 2013-11-14 DIAGNOSIS — C250 Malignant neoplasm of head of pancreas: Secondary | ICD-10-CM

## 2013-11-14 NOTE — Progress Notes (Signed)
Department of Radiation Oncology  Phone:  [phone] Fax:        [phone]  Weekly Treatment Note    Name: [name] Date: 11/14/2013 MRN: [id number] DOB: [birth year]   Current dose: 18 Gy  Current fraction: 10   MEDICATIONS: Current Outpatient Prescriptions  Medication Sig Dispense Refill  . Blood Glucose Monitoring Suppl (BLOOD GLUCOSE MONITOR KIT) KIT by Does not apply route. relion brand      . Cholecalciferol (VITAMIN D-3) 5000 UNITS TABS Take by mouth daily.       Marland Kitchen estradiol (ESTRING) 2 MG vaginal ring Place 2 mg vaginally every 3 (three) months. follow package directions  1 each  3  . Flaxseed, Linseed, (FLAXSEED OIL PO) Take 5 mLs by mouth daily.       . hyaluronate sodium (RADIAPLEXRX) GEL Apply 1 application topically 2 (two) times daily. Apply to  Affected skin  Area after rad tx and bedtime      . hydrochlorothiazide (HYDRODIURIL) 25 MG tablet Take 25 mg by mouth daily as needed (swelling).       Marland Kitchen levothyroxine (SYNTHROID, LEVOTHROID) 100 MCG tablet Take 50-100 mcg by mouth See admin instructions. Pt takes 1/2 tab for 50 mcg dose on Tuesday,thursday,saturday,sunday. 100 mcg  On Monday, Wednesday,friday      . Magnesium 250 MG TABS Take 250 mg by mouth daily.       . metFORMIN (GLUCOPHAGE-XR) 500 MG 24 hr tablet Take 1,000 mg by mouth 2 (two) times daily.      . metoprolol succinate (TOPROL-XL) 25 MG 24 hr tablet Take 25 mg by mouth daily.      . Multiple Vitamin (MULTIVITAMIN) tablet Take 1 tablet by mouth daily.      . Omega-3 Fatty Acids (FISH OIL) 1000 MG CAPS Take 1,000 mg by mouth daily.       Marland Kitchen omeprazole (PRILOSEC) 20 MG capsule Take 20 mg by mouth 2 (two) times daily.       . ondansetron (ZOFRAN) 8 MG tablet Take 1 tablet (8 mg total) by mouth 2 (two) times daily as needed (Nausea or vomiting).  30 tablet  1  . prochlorperazine (COMPAZINE) 10 MG tablet Take 1 tablet (10 mg total) by mouth every 6 (six) hours as needed (Nausea or vomiting).  30  tablet  1  . sucralfate (CARAFATE) 1 G tablet Take 1 tablet (1 g total) by mouth 2 (two) times daily.  60 tablet  5  . TRUEPLUS LANCETS 33G MISC        No current facility-administered medications for this encounter.     ALLERGIES: Aspirin; Gemfibrozil; Nsaids; Pravastatin; and Pse hcl-dm hbr-guaifenesin tan   LABORATORY DATA:  Lab Results  Component Value Date   WBC 2.8* 11/10/2013   HGB 12.2 11/10/2013   HCT 35.6 11/10/2013   MCV 93.0 11/10/2013   PLT 226 11/10/2013   Lab Results  Component Value Date   NA 135* 11/10/2013   K 4.0 11/10/2013   CL 94* 10/15/2013   CO2 25 11/10/2013   Lab Results  Component Value Date   ALT 84* 11/03/2013   AST 57* 11/03/2013   ALKPHOS 402* 11/03/2013   BILITOT 0.77 11/03/2013     NARRATIVE: [name] was seen today for weekly treatment management. The chart was checked and the patient's films were reviewed. The patient is doing very well with her treatment so far. No diarrhea or significant nausea. She is taking nausea medication.  PHYSICAL EXAMINATION: Alert,  in no acute distress  ASSESSMENT: The patient is doing satisfactorily with treatment.  PLAN: We will continue with the patient's radiation treatment as planned.

## 2013-11-14 NOTE — Progress Notes (Signed)
Weekly rad txs, pancreas 10 tx completed, no c/o pain, nausea, gas, or diarrhea, appetite good, takes her nausea medication , using radiaplex on abdomen, no skin changes or redness, left arm does have redness, patient says from chemotherapy reaction, feels like slight more redness moving across arm, continue to monitor,  4:05 PM

## 2013-11-16 ENCOUNTER — Encounter: Payer: Self-pay | Admitting: Genetic Counselor

## 2013-11-16 NOTE — Progress Notes (Signed)
HPI:  [name] was previously seen in the [facility] due to a personal and family of cancer and concerns regarding a hereditary predisposition to cancer. Please refer to our prior cancer genetics clinic note for more information regarding [name]'s medical, social and family histories, and our assessment and recommendations, at the time. [name] recent genetic test results were disclosed to her, as were recommendations warranted by these results. These results and recommendations are discussed in more detail below.  GENETIC TEST RESULTS: At the time of [name] visit, we recommended she pursue genetic testing of the Breast/Ovarian cancer gene panel. This test, which included sequencing and deletion/duplication analysis of the genes listed on the test report, was performed at [facility]. Genetic testing was normal, and did not reveal a mutation in these genes. A complete list of all genes tested is located on the test report scanned into EP[employer].    We discussed with [name] that since the current genetic testing is not perfect, it is possible there may be a gene mutation in one of these genes that current testing cannot detect, but that chance is small.  We also discussed, that it is possible that another gene that has not yet been discovered, or that we have not yet tested, is responsible for the cancer diagnoses in the family, and it is, therefore, important to remain in touch with cancer genetics in the future so that we can continue to offer [name] the most up to date genetic testing.   CANCER SCREENING RECOMMENDATIONS: This result suggests that [name] cancer was most likely not due to an inherited predisposition associated with one of these genes.  Most cancers happen by chance and this negative test, along with details of her family history, suggests that her cancer falls into this category.  We, therefore, recommended she continue to follow  the cancer management and screening guidelines provided by her primary providers.   RECOMMENDATIONS FOR FAMILY MEMBERS:  While these results are reassuring for [name], this test does not tell us anything about [name] relatives' risks. We recommended other family members who have had early onset breast cancer also have genetic counseling and testing. Please let us know if we can help facilitate testing. Genetic counselors can be located in other cities, by visiting the website of the [employer] ([employer]) and Field seismologist for a Dietitian by zip code.    FOLLOW-UP: Lastly, we discussed with [name] that cancer genetics is a rapidly advancing field and it is possible that new genetic tests will be appropriate for her and/or her family members in the future. We encouraged her to remain in contact with cancer genetics on an annual basis so we can update her personal and family histories and let her know of advances in cancer genetics that may benefit this family.   Our contact number was provided. [name] questions were answered to her satisfaction, and she knows she is welcome to call us at anytime with additional questions or concerns. This patient was discussed with Dr. [name] who agrees with the above.   Bridget A. Fine, MS, CGC Certified Genetic Counseor phone: [phone] [email or web address]

## 2013-11-17 ENCOUNTER — Other Ambulatory Visit (HOSPITAL_BASED_OUTPATIENT_CLINIC_OR_DEPARTMENT_OTHER): Payer: Medicare Other

## 2013-11-17 ENCOUNTER — Ambulatory Visit (HOSPITAL_BASED_OUTPATIENT_CLINIC_OR_DEPARTMENT_OTHER): Payer: Medicare Other

## 2013-11-17 ENCOUNTER — Other Ambulatory Visit: Payer: Self-pay | Admitting: Internal Medicine

## 2013-11-17 ENCOUNTER — Ambulatory Visit
Admission: RE | Admit: 2013-11-17 | Discharge: 2013-11-17 | Disposition: A | Discharge status: Home / Self Care | Payer: Medicare Other | Source: Ambulatory Visit | Attending: Radiation Oncology | Admitting: Radiation Oncology

## 2013-11-17 VITALS — BP 121/70 | HR 100 | Temp 98.0°F | Resp 16

## 2013-11-17 DIAGNOSIS — C250 Malignant neoplasm of head of pancreas: Secondary | ICD-10-CM

## 2013-11-17 DIAGNOSIS — C259 Malignant neoplasm of pancreas, unspecified: Secondary | ICD-10-CM

## 2013-11-17 DIAGNOSIS — Z5111 Encounter for antineoplastic chemotherapy: Secondary | ICD-10-CM

## 2013-11-17 LAB — CBC WITH DIFFERENTIAL/PLATELET
BASO%: 0.4 % (ref 0.0–2.0)
BASOS ABS: 0 10*3/uL (ref 0.0–0.1)
EOS ABS: 0.2 10*3/uL (ref 0.0–0.5)
EOS%: 5.5 % (ref 0.0–7.0)
HEMATOCRIT: 35.3 % (ref 34.8–46.6)
HEMOGLOBIN: 11.8 g/dL (ref 11.6–15.9)
LYMPH#: 0.4 10*3/uL — AB (ref 0.9–3.3)
LYMPH%: 13.5 % — AB (ref 14.0–49.7)
MCH: 31.3 pg (ref 25.1–34.0)
MCHC: 33.4 g/dL (ref 31.5–36.0)
MCV: 93.6 fL (ref 79.5–101.0)
MONO#: 0.3 10*3/uL (ref 0.1–0.9)
MONO%: 12.4 % (ref 0.0–14.0)
NEUT%: 68.2 % (ref 38.4–76.8)
NEUTROS ABS: 1.9 10*3/uL (ref 1.5–6.5)
RBC: 3.77 10*6/uL (ref 3.70–5.45)
RDW: 13.5 % (ref 11.2–14.5)
WBC: 2.8 10*3/uL — AB (ref 3.9–10.3)
nRBC: 0 % (ref 0–0)

## 2013-11-17 LAB — COMPREHENSIVE METABOLIC PANEL (CC13)
ALBUMIN: 3.4 g/dL — AB (ref 3.5–5.0)
ALT: 21 U/L (ref 0–55)
AST: 23 U/L (ref 5–34)
Alkaline Phosphatase: 181 U/L — ABNORMAL HIGH (ref 40–150)
Anion Gap: 9 mEq/L (ref 3–11)
BUN: 11.5 mg/dL (ref 7.0–26.0)
CALCIUM: 9.6 mg/dL (ref 8.4–10.4)
CO2: 29 meq/L (ref 22–29)
CREATININE: 0.8 mg/dL (ref 0.6–1.1)
Chloride: 97 mEq/L — ABNORMAL LOW (ref 98–109)
Glucose: 148 mg/dl — ABNORMAL HIGH (ref 70–140)
Potassium: 4.9 mEq/L (ref 3.5–5.1)
Sodium: 135 mEq/L — ABNORMAL LOW (ref 136–145)
Total Bilirubin: 0.5 mg/dL (ref 0.20–1.20)
Total Protein: 6.6 g/dL (ref 6.4–8.3)

## 2013-11-17 MED ORDER — ONDANSETRON HCL 8 MG PO TABS
8.0000 mg | ORAL_TABLET | Freq: Once | ORAL | Status: AC
  Administered 2013-11-17: 8 mg via ORAL

## 2013-11-17 MED ORDER — SODIUM CHLORIDE 0.9 % IV SOLN
Freq: Once | INTRAVENOUS | Status: AC
  Administered 2013-11-17: 14:00:00 via INTRAVENOUS

## 2013-11-17 MED ORDER — SODIUM CHLORIDE 0.9 % IV SOLN
500.0000 mg/m2 | Freq: Once | INTRAVENOUS | Status: AC
  Administered 2013-11-17: 722 mg via INTRAVENOUS
  Filled 2013-11-17: qty 18.99

## 2013-11-17 MED ORDER — ONDANSETRON HCL 8 MG PO TABS
ORAL_TABLET | ORAL | Status: AC
  Filled 2013-11-17: qty 1

## 2013-11-17 NOTE — Progress Notes (Signed)
1330-OK to treat with platelet count of 97 per Dr. [name].  1505-Pt states that Compazine 10mg  PO makes her too "sleepy" and is requesting to take Zofran instead.  Dr. [name] notified and order received for pt to take Zofran 8 mg PO prior to Gemzar.

## 2013-11-17 NOTE — Patient Instructions (Signed)
[facility] Discharge Instructions for Patients Receiving Chemotherapy  Today you received the following chemotherapy agents:  Gemzar  To help prevent nausea and vomiting after your treatment, we encourage you to take your nausea medication as ordered per MD.   If you develop nausea and vomiting that is not controlled by your nausea medication, call the clinic.   BELOW ARE SYMPTOMS THAT SHOULD BE REPORTED IMMEDIATELY:  *FEVER GREATER THAN 100.5 F  *CHILLS WITH OR WITHOUT FEVER  NAUSEA AND VOMITING THAT IS NOT CONTROLLED WITH YOUR NAUSEA MEDICATION  *UNUSUAL SHORTNESS OF BREATH  *UNUSUAL BRUISING OR BLEEDING  TENDERNESS IN MOUTH AND THROAT WITH OR WITHOUT PRESENCE OF ULCERS  *URINARY PROBLEMS  *BOWEL PROBLEMS  UNUSUAL RASH Items with * indicate a potential emergency and should be followed up as soon as possible.  Feel free to call the clinic you have any questions or concerns. The clinic phone number is [phone].    

## 2013-11-18 ENCOUNTER — Ambulatory Visit
Admission: RE | Admit: 2013-11-18 | Discharge: 2013-11-18 | Disposition: A | Discharge status: Home / Self Care | Payer: Medicare Other | Source: Ambulatory Visit | Attending: Radiation Oncology | Admitting: Radiation Oncology

## 2013-11-19 ENCOUNTER — Ambulatory Visit
Admission: RE | Admit: 2013-11-19 | Discharge: 2013-11-19 | Disposition: A | Discharge status: Home / Self Care | Payer: Medicare Other | Source: Ambulatory Visit | Attending: Radiation Oncology | Admitting: Radiation Oncology

## 2013-11-20 ENCOUNTER — Ambulatory Visit
Admission: RE | Admit: 2013-11-20 | Discharge: 2013-11-20 | Disposition: A | Discharge status: Home / Self Care | Payer: Medicare Other | Source: Ambulatory Visit | Attending: Radiation Oncology | Admitting: Radiation Oncology

## 2013-11-21 ENCOUNTER — Ambulatory Visit
Admission: RE | Admit: 2013-11-21 | Discharge: 2013-11-21 | Disposition: A | Discharge status: Home / Self Care | Payer: Medicare Other | Source: Ambulatory Visit | Attending: Radiation Oncology | Admitting: Radiation Oncology

## 2013-11-21 ENCOUNTER — Other Ambulatory Visit: Payer: Self-pay

## 2013-11-21 VITALS — Wt 108.3 lb

## 2013-11-21 DIAGNOSIS — C250 Malignant neoplasm of head of pancreas: Secondary | ICD-10-CM

## 2013-11-21 DIAGNOSIS — C259 Malignant neoplasm of pancreas, unspecified: Secondary | ICD-10-CM

## 2013-11-21 NOTE — Progress Notes (Signed)
Weekly rad txs pancreas 15 completed,  Patient in w/c, has taken Zofran 2x today for nausea, no pain, just weakness, fatigued, had Chemotherapy Monday Gemzar, and yesterday had diarrhea, took questran powder,  Resolved , radiaplex on abdonomen bid, no skin changes 4:16 PM

## 2013-11-21 NOTE — Progress Notes (Signed)
Department of Radiation Oncology  Phone:  [phone] Fax:        [phone]  Weekly Treatment Note    Name: [name] Date: 11/21/2013 MRN: [id number] DOB: 1928/04/12   Current dose: 27 Gy  Current fraction: 15   MEDICATIONS: Current Outpatient Prescriptions  Medication Sig Dispense Refill  . Blood Glucose Monitoring Suppl (BLOOD GLUCOSE MONITOR KIT) KIT by Does not apply route. relion brand      . Cholecalciferol (VITAMIN D-3) 5000 UNITS TABS Take by mouth daily.       Marland Kitchen estradiol (ESTRING) 2 MG vaginal ring Place 2 mg vaginally every 3 (three) months. follow package directions  1 each  3  . Flaxseed, Linseed, (FLAXSEED OIL PO) Take 5 mLs by mouth daily.       . hyaluronate sodium (RADIAPLEXRX) GEL Apply 1 application topically 2 (two) times daily. Apply to  Affected skin  Area after rad tx and bedtime      . hydrochlorothiazide (HYDRODIURIL) 25 MG tablet Take 25 mg by mouth daily as needed (swelling).       Marland Kitchen levothyroxine (SYNTHROID, LEVOTHROID) 100 MCG tablet Take 50-100 mcg by mouth See admin instructions. Pt takes 1/2 tab for 50 mcg dose on Tuesday,thursday,saturday,sunday. 100 mcg  On Monday, Wednesday,friday      . Magnesium 250 MG TABS Take 250 mg by mouth daily.       . metFORMIN (GLUCOPHAGE-XR) 500 MG 24 hr tablet Take 1,000 mg by mouth 2 (two) times daily.      . metoprolol succinate (TOPROL-XL) 25 MG 24 hr tablet Take 25 mg by mouth daily.      . Multiple Vitamin (MULTIVITAMIN) tablet Take 1 tablet by mouth daily.      . Omega-3 Fatty Acids (FISH OIL) 1000 MG CAPS Take 1,000 mg by mouth daily.       Marland Kitchen omeprazole (PRILOSEC) 20 MG capsule Take 20 mg by mouth 2 (two) times daily.       . ondansetron (ZOFRAN) 8 MG tablet Take 1 tablet (8 mg total) by mouth 2 (two) times daily as needed (Nausea or vomiting).  30 tablet  1  . prochlorperazine (COMPAZINE) 10 MG tablet Take 1 tablet (10 mg total) by mouth every 6 (six) hours as needed (Nausea or vomiting).  30  tablet  1  . sucralfate (CARAFATE) 1 G tablet Take 1 tablet (1 g total) by mouth 2 (two) times daily.  60 tablet  5  . TRUEPLUS LANCETS 33G MISC        No current facility-administered medications for this encounter.     ALLERGIES: Aspirin; Gemfibrozil; Nsaids; Pravastatin; and Pse hcl-dm hbr-guaifenesin tan   LABORATORY DATA:  Lab Results  Component Value Date   WBC 2.8* 11/17/2013   HGB 11.8 11/17/2013   HCT 35.3 11/17/2013   MCV 93.6 11/17/2013   PLT 97 Platelet count confirmed by slide estimate* 11/17/2013   Lab Results  Component Value Date   NA 135* 11/17/2013   K 4.9 11/17/2013   CL 94* 10/15/2013   CO2 29 11/17/2013   Lab Results  Component Value Date   ALT 21 11/17/2013   AST 23 11/17/2013   ALKPHOS 181* 11/17/2013   BILITOT 0.50 11/17/2013     NARRATIVE: Dayami W Ammon was seen today for weekly treatment management. The chart was checked and the patient's films were reviewed. The patient states she is doing fairly well again this week. She has experienced some nausea this week as  well as some diarrhea yesterday. She does feel that the Zofran nausea medication is working well however. Diarrhea has resolved with medication.  PHYSICAL EXAMINATION: weight is 108 lb 4.8 oz (49.125 kg).        ASSESSMENT: The patient is doing satisfactorily with treatment.  PLAN: We will continue with the patient's radiation treatment as planned.

## 2013-11-24 ENCOUNTER — Telehealth: Payer: Self-pay | Admitting: Internal Medicine

## 2013-11-24 ENCOUNTER — Other Ambulatory Visit (HOSPITAL_BASED_OUTPATIENT_CLINIC_OR_DEPARTMENT_OTHER): Payer: Medicare Other

## 2013-11-24 ENCOUNTER — Encounter: Payer: Self-pay | Admitting: Physician Assistant

## 2013-11-24 ENCOUNTER — Ambulatory Visit (HOSPITAL_BASED_OUTPATIENT_CLINIC_OR_DEPARTMENT_OTHER): Payer: Medicare Other

## 2013-11-24 ENCOUNTER — Other Ambulatory Visit: Payer: Self-pay | Admitting: Internal Medicine

## 2013-11-24 ENCOUNTER — Encounter: Payer: Self-pay | Admitting: Internal Medicine

## 2013-11-24 ENCOUNTER — Ambulatory Visit: Payer: Medicare Other

## 2013-11-24 ENCOUNTER — Ambulatory Visit
Admission: RE | Admit: 2013-11-24 | Discharge: 2013-11-24 | Disposition: A | Discharge status: Home / Self Care | Payer: Medicare Other | Source: Ambulatory Visit | Attending: Radiation Oncology | Admitting: Radiation Oncology

## 2013-11-24 ENCOUNTER — Ambulatory Visit (HOSPITAL_BASED_OUTPATIENT_CLINIC_OR_DEPARTMENT_OTHER): Payer: Medicare Other | Admitting: Physician Assistant

## 2013-11-24 ENCOUNTER — Other Ambulatory Visit: Payer: Medicare Other

## 2013-11-24 VITALS — BP 121/64 | HR 104 | Temp 98.0°F | Resp 18 | Ht 61.0 in | Wt 106.4 lb

## 2013-11-24 DIAGNOSIS — Z5111 Encounter for antineoplastic chemotherapy: Secondary | ICD-10-CM

## 2013-11-24 DIAGNOSIS — R7402 Elevation of levels of lactic acid dehydrogenase (LDH): Secondary | ICD-10-CM

## 2013-11-24 DIAGNOSIS — C259 Malignant neoplasm of pancreas, unspecified: Secondary | ICD-10-CM

## 2013-11-24 DIAGNOSIS — R740 Nonspecific elevation of levels of transaminase and lactic acid dehydrogenase [LDH]: Secondary | ICD-10-CM

## 2013-11-24 DIAGNOSIS — C250 Malignant neoplasm of head of pancreas: Secondary | ICD-10-CM

## 2013-11-24 DIAGNOSIS — R7401 Elevation of levels of liver transaminase levels: Secondary | ICD-10-CM | POA: Diagnosis not present

## 2013-11-24 LAB — CBC WITH DIFFERENTIAL/PLATELET
BASO%: 0.8 % (ref 0.0–2.0)
Basophils Absolute: 0 10*3/uL (ref 0.0–0.1)
EOS%: 1.6 % (ref 0.0–7.0)
Eosinophils Absolute: 0 10*3/uL (ref 0.0–0.5)
HCT: 33.9 % — ABNORMAL LOW (ref 34.8–46.6)
HEMOGLOBIN: 11.7 g/dL (ref 11.6–15.9)
LYMPH%: 7.8 % — ABNORMAL LOW (ref 14.0–49.7)
MCH: 32.1 pg (ref 25.1–34.0)
MCHC: 34.5 g/dL (ref 31.5–36.0)
MCV: 93.1 fL (ref 79.5–101.0)
MONO#: 0.4 10*3/uL (ref 0.1–0.9)
MONO%: 14.3 % — AB (ref 0.0–14.0)
NEUT%: 75.5 % (ref 38.4–76.8)
NEUTROS ABS: 1.8 10*3/uL (ref 1.5–6.5)
NRBC: 0 % (ref 0–0)
Platelets: 143 10*3/uL — ABNORMAL LOW (ref 145–400)
RBC: 3.64 10*6/uL — AB (ref 3.70–5.45)
RDW: 13.7 % (ref 11.2–14.5)
WBC: 2.4 10*3/uL — ABNORMAL LOW (ref 3.9–10.3)
lymph#: 0.2 10*3/uL — ABNORMAL LOW (ref 0.9–3.3)

## 2013-11-24 LAB — COMPREHENSIVE METABOLIC PANEL (CC13)
ALBUMIN: 3.4 g/dL — AB (ref 3.5–5.0)
ALT: 12 U/L (ref 0–55)
AST: 19 U/L (ref 5–34)
Alkaline Phosphatase: 136 U/L (ref 40–150)
Anion Gap: 12 mEq/L — ABNORMAL HIGH (ref 3–11)
BILIRUBIN TOTAL: 0.55 mg/dL (ref 0.20–1.20)
BUN: 10.8 mg/dL (ref 7.0–26.0)
CO2: 25 mEq/L (ref 22–29)
Calcium: 9.5 mg/dL (ref 8.4–10.4)
Chloride: 96 mEq/L — ABNORMAL LOW (ref 98–109)
Creatinine: 0.8 mg/dL (ref 0.6–1.1)
Glucose: 235 mg/dl — ABNORMAL HIGH (ref 70–140)
POTASSIUM: 4 meq/L (ref 3.5–5.1)
Sodium: 133 mEq/L — ABNORMAL LOW (ref 136–145)
TOTAL PROTEIN: 6.5 g/dL (ref 6.4–8.3)

## 2013-11-24 MED ORDER — PROCHLORPERAZINE MALEATE 10 MG PO TABS
ORAL_TABLET | ORAL | Status: AC
  Filled 2013-11-24: qty 1

## 2013-11-24 MED ORDER — SODIUM CHLORIDE 0.9 % IV SOLN
Freq: Once | INTRAVENOUS | Status: AC
  Administered 2013-11-24: 15:00:00 via INTRAVENOUS

## 2013-11-24 MED ORDER — PROCHLORPERAZINE MALEATE 10 MG PO TABS
10.0000 mg | ORAL_TABLET | Freq: Once | ORAL | Status: AC
  Administered 2013-11-24: 10 mg via ORAL

## 2013-11-24 MED ORDER — SODIUM CHLORIDE 0.9 % IV SOLN
500.0000 mg/m2 | Freq: Once | INTRAVENOUS | Status: AC
  Administered 2013-11-24: 722 mg via INTRAVENOUS
  Filled 2013-11-24: qty 19

## 2013-11-24 NOTE — Progress Notes (Signed)
[place], MD [address] [place] [address]  DIAGNOSIS: Cancer of head of pancreas - Plan: CBC with Differential, Comprehensive metabolic panel (Cmet) - [facility], CBC with Differential, Comprehensive metabolic panel (Cmet) - [facility]  No chief complaint on file.   CURRENT TREATMENT: Planning neoadjuvant chemoradiation starting on 03/30 - 12/10/2013.  Gemcitabine 500 mg/m2 weekly with concurrent XRT.      Pancreatic cancer   10/03/2013 Imaging CT of abdomen: Mass within the head and uncinate process of the pancreas obstructs both the common bile duct and pancreatic duct.  No evidence of mets   10/03/2013 - 10/05/2013 Hospital Admission Admitted to hospitalist team at [facility] hospital due to bstructive jaundice and transaminitis secondary to pancreatic head mass noted on outpatient CT abdomen and pelvis. GI consulted.    10/04/2013 Procedure ERCP (Dr. Benson Norway) performed.  Malignant CBD obstruction with brushings obtained. EUS planned.    10/04/2013 Pathology Diagnosis BILE DUCT BRUSHING, COMMON (SPECIMEN 1 OF 1 COLLECTED 10-04-2013)  ATYPICAL CELLS, HIGHLY SUSPICIOUS FOR MALIGNANCY.   10/04/2013 Tumor Marker CA 19 9, 640.8   10/07/2013 Procedure EUS (Dr. Benson Norway) performed revealing a 2.9 x 3.3 cm pancreatic head mass without portal vein invasion, dilated CBC to 1.5 cm with plastic stent, dilated PD to 1 cm. FNA obtained.   10/07/2013 Pathology FINE NEEDLE ASPIRATION, ENDOSCOPIC, PANCREAS HEAD(SPECIMEN 1 OF 1 COLLECTED 10/07/13): MALIGNANT CELLS CONSISTENT WITH ADENOCARCINOMA.   10/07/2013 Initial Diagnosis Pancreatic cancer   10/14/2013 Cancer Staging Seen by Dr. Barry Dienes who recommends chemoradiation. Referred to oncology and radiation oncology. CT of chest ordered to complete staging.    10/14/2013 Family History referring her to genetics since she has had breast cancer, and has significant family history of breast cancer and prostate cancer    10/17/2013 Imaging CT of Chest: Negative for lung metastases.    INTERVAL HISTORY: [name] 80 y.o. female with a history of breast cancer s/p mastectomy of right breast who was referred by Dr. Barry Dienes of Surgical oncology for further evaluation of her newly diagnosed pancreatic adenocarcinoma. Today, she is accompanied by a family member. Overall she's tolerating her weekly gemcitabine and radiation therapy relatively well. She's completed 3 weeks. She did have some issues with nausea and vomiting. She is controlling these symptoms relatively well with Zofran and Compazine. She voices no other specific complaints today  As previously noted, she was evaluated by Dr. Barry Dienes for consideration of surgery for her localized pancreatic cancer  and recommended chemoradiation while the patient determines if she wants to proceed given the extent of the surgery required. She was seen by Dr. Lisbeth Renshaw on 03/18 and determined to be a good candidate for chemoradiation.  She had a CT of chest which was negative.   Patient was driving a few months ago and remains independent of her basic, intermediate and advanced activities of daily of living. She has a notable family history of cancer. Her daughter had breast cancer at age 81. Her sister passed away from breast cancer with metastatic disease to the bone. She met with the genetic Counselor on 10/28/2013. She gave consent  To pursue genetic testing and a blood sample was sent to [facility] for analysis of the breast/ovarian cancer panel. Results are pending.   MEDICAL HISTORY: Past Medical History  Diagnosis Date  . Arthritis     Left knee, s/p cortisone injection  . GERD (gastroesophageal reflux disease)   . Complication of anesthesia  Very hard to wake up after anesthesia  . Dizziness   . Seasonal allergies   . Frequency of urination   . Dysrhythmia     Sinus tachycardia can get up to 108. Started after chemo   . Hypertension   .  Hyperlipidemia   . Thyroid disease   . Hypothyroidism   . Breast cancer 2001    S/P mastectomy, XRT and chemo  . Cancer 10/07/13    pancreatic cancer/adenocarcinoma    INTERIM HISTORY: has Hypertension; Hypothyroidism; GERD (gastroesophageal reflux disease); Cholelithiasis with choledocholithiasis; Hyperlipidemia; Breast cancer; Encounter for long-term (current) use of other medications; Transaminitis; T2 NIDDM; Pancreatic cancer; and Cancer of head of pancreas on her problem list.    ALLERGIES:  is allergic to aspirin; gemfibrozil; nsaids; pravastatin; and pse hcl-dm hbr-guaifenesin tan.  MEDICATIONS: has a current medication list which includes the following prescription(s): blood glucose monitor kit, vitamin d-3, estradiol, flaxseed (linseed), hyaluronate sodium, levothyroxine, magnesium, metformin, metoprolol succinate, multivitamin, fish oil, omeprazole, ondansetron, sucralfate, trueplus lancets 33g, hydrochlorothiazide, and prochlorperazine.  SURGICAL HISTORY:  Past Surgical History  Procedure Laterality Date  . Joint replacement      right knee  . Carpal tunnel release      bilateral  . Incontinence surgery    . Cataract extraction      left  . Mastectomy      right due to cancer  . Ercp  10/06/2011    Procedure: ENDOSCOPIC RETROGRADE CHOLANGIOPANCREATOGRAPHY (ERCP);  Surgeon: Gatha Mayer, MD;  Location: [facility];  Service: Endoscopy;  Laterality: N/A;  or general per anesthesia  . Sphincterotomy  10/06/2011    Procedure: SPHINCTEROTOMY;  Surgeon: Gatha Mayer, MD;  Location: [facility];  Service: Endoscopy;;  . Cholecystectomy  10/07/2011    Procedure: LAPAROSCOPIC CHOLECYSTECTOMY WITH INTRAOPERATIVE CHOLANGIOGRAM;  Surgeon: Edward Jolly, MD;  Location: [facility];  Service: General;  Laterality: N/A;  . Tonsillectomy    . Colonoscopy    . Eye surgery    . Lumbar laminectomy/decompression microdiscectomy  05/27/2012    Procedure: LUMBAR LAMINECTOMY/DECOMPRESSION  MICRODISCECTOMY 1 LEVEL;  Surgeon: Ophelia Charter, MD;  Location: [facility];  Service: Neurosurgery;  Laterality: Bilateral;  Lumbar four laminectomy, with bilateral lumbar three laminotomies.  . Ercp N/A 10/04/2013    Procedure: ENDOSCOPIC RETROGRADE CHOLANGIOPANCREATOGRAPHY (ERCP);  Surgeon: Beryle Beams, MD;  Location: [facility];  Service: Gastroenterology;  Laterality: N/A;  . Eus N/A 10/07/2013    Procedure: UPPER ENDOSCOPIC ULTRASOUND (EUS) LINEAR;  Surgeon: Beryle Beams, MD;  Location: [facility];  Service: Endoscopy;  Laterality: N/A;    REVIEW OF SYSTEMS:   Constitutional: Denies fevers, chills or abnormal weight loss Eyes: Denies blurriness of vision Ears, nose, mouth, throat, and face: Denies mucositis or sore throat Respiratory: Denies cough, dyspnea or wheezes Cardiovascular: Denies palpitation, chest discomfort or lower extremity swelling Gastrointestinal:  Positive for nausea and vomiting, denies heartburn or change in bowel habits Skin: Denies abnormal skin rashes Lymphatics: Denies new lymphadenopathy or easy bruising Neurological:Denies numbness, tingling or new weaknesses Behavioral/Psych: Mood is stable, no new changes  All other systems were reviewed with the patient and are negative.  PHYSICAL EXAMINATION: ECOG PERFORMANCE STATUS: 0 - Asymptomatic  Blood pressure 121/64, pulse 104, temperature 98 F (36.7 C), temperature source Oral, resp. rate 18, height $RemoveBe'5\' 1"'mHvFVAmGn$  (1.549 m), weight 106 lb 6.4 oz (48.263 kg), SpO2 100.00%.  GENERAL:alert, no distress and comfortable;easily gets to the exam table. Ambulates with cane secondary to right knee  prior surgery.  SKIN: skin color, texture, turgor are normal, no rashes or significant lesions EYES: normal, Conjunctiva are pink and non-injected, sclera clear OROPHARYNX:no exudate, no erythema and lips, buccal mucosa, and tongue normal  NECK: supple, thyroid normal size, non-tender, without nodularity LYMPH:  no palpable  lymphadenopathy in the cervical, axillary or supraclavicular LUNGS: clear to auscultation with normal breathing effort, no wheezes or rhonchi HEART: regular rate & rhythm and no murmurs and no lower extremity edema ABDOMEN:abdomen soft, non-tender and normal bowel sounds Musculoskeletal:no cyanosis of digits and no clubbing  NEURO: alert & oriented x 3 with fluent speech, no focal motor/sensory deficits  Labs:  Lab Results  Component Value Date   WBC 2.4* 11/24/2013   HGB 11.7 11/24/2013   HCT 33.9* 11/24/2013   MCV 93.1 11/24/2013   PLT 143* 11/24/2013   NEUTROABS 1.8 11/24/2013      Chemistry      Component Value Date/Time   NA 133* 11/24/2013 1246   NA 132* 10/15/2013 1313   K 4.0 11/24/2013 1246   K 4.3 10/15/2013 1313   CL 94* 10/15/2013 1313   CO2 25 11/24/2013 1246   CO2 26 10/15/2013 1313   BUN 10.8 11/24/2013 1246   BUN 15 10/15/2013 1313   CREATININE 0.8 11/24/2013 1246   CREATININE 0.69 10/15/2013 1313   CREATININE 0.73 10/05/2013 0423      Component Value Date/Time   CALCIUM 9.5 11/24/2013 1246   CALCIUM 9.7 10/15/2013 1313   ALKPHOS 136 11/24/2013 1246   ALKPHOS 493* 10/15/2013 1313   AST 19 11/24/2013 1246   AST 30 10/15/2013 1313   ALT 12 11/24/2013 1246   ALT 116* 10/15/2013 1313   BILITOT 0.55 11/24/2013 1246   BILITOT 1.5* 10/15/2013 1313      CBC:  Recent Labs Lab 11/24/13 1246  WBC 2.4*  NEUTROABS 1.8  HGB 11.7  HCT 33.9*  MCV 93.1  PLT 143*   Studies:  No results found.   RADIOGRAPHIC STUDIES: Dg Chest 2 View  10/01/2013   CLINICAL DATA:  Abdominal discomfort, weakness  EXAM: CHEST  2 VIEW  COMPARISON:  05/22/2012  FINDINGS: Cardiomediastinal silhouette is stable. Again noted status post right mastectomy. Degenerative changes and levoscoliosis lumbar spine. Stable degenerative changes lower thoracic spine. Mild hyperinflation again noted. No acute infiltrate or pleural effusion. No pulmonary edema.  IMPRESSION: No active disease. Status post right mastectomy.  Lumbar levoscoliosis.   Electronically Signed   By: Lahoma Crocker M.D.   On: 10/01/2013 15:12   Ct Chest W Contrast  10/20/2013   CLINICAL DATA:  Pancreatic cancer. Evaluate metastatic disease. History of right breast cancer.  EXAM: CT CHEST WITH CONTRAST  TECHNIQUE: Multidetector CT imaging of the chest was performed during intravenous contrast administration.  CONTRAST:  54mL OMNIPAQUE IOHEXOL 300 MG/ML  SOLN  COMPARISON:  CT ABD/PELVIS W CM dated 10/03/2013  FINDINGS: No pathologically enlarged mediastinal, hilar or axillary lymph nodes. No internal mammary adenopathy. Heart size normal. Three-vessel coronary artery calcification. No pericardial effusion.  Biapical pleural parenchymal scarring. Mild subpleural radiation fibrosis in the anterior right hemi thorax. Findings are worst in the right middle lobe. Mild scattered scarring. Lungs are otherwise clear. No pleural fluid. Airway is unremarkable.  Incidental imaging of the upper abdomen shows pneumobilia with a stent in the common bile duct. Biliary ductal dilatation is again seen and partially imaged. Marked pancreatic ductal dilatation with atrophy of the body and tail. The primary pancreatic mass in the head and  uncinate process, as seen on 10/03/2013, was not imaged today. Visualized portions of the adrenal glands, kidneys, spleen and stomach are grossly unremarkable. No worrisome lytic or sclerotic lesions. Degenerative changes are seen in the spine.  IMPRESSION: 1. No evidence of metastatic disease in the chest. 2. Three-vessel coronary artery calcification. 3. Interval common bile duct stent placement with pneumobilia and persistent marked dilatation of the biliary tree and pancreatic duct, incompletely imaged. Pancreatic mass was not imaged on the current exam. Please refer to examination of 10/03/2013 for further details.   Electronically Signed   By: Lorin Picket M.D.   On: 10/20/2013 15:40   Ct Abdomen Pelvis W Contrast  10/03/2013   CLINICAL  DATA:  Mid to lower abdominal pain  EXAM: CT ABDOMEN AND PELVIS WITH CONTRAST  TECHNIQUE: Multidetector CT imaging of the abdomen and pelvis was performed using the standard protocol following bolus administration of intravenous contrast.  CONTRAST:  185mL OMNIPAQUE IOHEXOL 300 MG/ML  SOLN  COMPARISON:  05/22/2013  FINDINGS: There is no pleural or pericardial effusion identified. There is diffuse intrahepatic bile duct dilatation. The common bile duct is markedly dilated measuring up to 2.5 cm, image 24/series 2. There is a mass within the head and uncinate process of the pancreas which measures 3.1 cm, image 25/ series 602. There is not only obstruction of the common bile duct, but the pancreatic duct is also obstructed and dilated. The mass appears to touch but does not encase the superior mesenteric artery, image 29/series 2. The mass also appears to abut the posterior aspect of the portal vein, image 27/series 2. The lumen of the portal vein and superior mesenteric arteries are not compromised. The celiac artery appears uninvolved the spleen is normal. The splenic vein is patent  The adrenal glands both appear normal. Normal appearance of both kidneys. The urinary bladder appears normal. A pessary device is identified within the dome of vagina. Next  Calcified atherosclerotic disease involves the abdominal aorta. There is no aneurysm. No upper abdominal adenopathy. There is no pelvic or inguinal adenopathy identified.  The stomach is normal. The small bowel loops have a normal appearance without obstruction. The appendix is visualized and appears normal. Normal appearance of the colon.  Review of the visualized bony structures is significant for scoliosis and multi level degenerative disc disease. No aggressive lytic or sclerotic bone lesions.  IMPRESSION: 1. Mass within the head and uncinate process of the pancreas obstructs both the common bile duct and pancreatic duct. 2. No specific features identified to  suggest metastatic disease.   Electronically Signed   By: Kerby Moors M.D.   On: 10/03/2013 13:49   Dg Ercp  10/04/2013   CLINICAL DATA:  Obstructive jaundice, ERCP with stent placement  EXAM: ERCP  TECHNIQUE: Multiple spot images obtained with the fluoroscopic device and submitted for interpretation post-procedure.  COMPARISON:  CT ABD/PELVIS W CM dated 10/03/2013; DG ABD 2 VIEWS dated 10/01/2013; CT ABD/PELV WO CM dated 05/22/2013  FINDINGS: Two spot intraoperative fluoroscopic images during ERCP are provided for review.  Initial image demonstrates an ERCP probe overlying the right upper quadrant with selective cannulation of the common bile duct. There is minimal opacification of the intrahepatic biliary system which appears moderate to severely dilated centrally as demonstrated on recently performed abdominal CT.  Subsequent image demonstrates deployment of a plastic internal biliary stent overlying the distal aspect of the common bile duct.  Post cholecystectomy.  Enteric contrast is seen within the colon.  IMPRESSION: ERCP  with plastic biliary stent placement as above.  These images were submitted for radiologic interpretation only. Please see the procedural report for the amount of contrast and the fluoroscopy time utilized.   Electronically Signed   By: Simonne Come M.D.   On: 10/04/2013 11:59   Dg Abd 2 Views  10/01/2013   CLINICAL DATA:  Abdominal pain  EXAM: ABDOMEN - 2 VIEW  COMPARISON:  CT ABD/PELV WO CM dated 05/22/2013; DG LUMBAR SPINE 1 VIEW dated 05/27/2012  FINDINGS: There is a paucity of bowel gas. A moderate to large amount of stool is appreciated. Severe levoscoliosis appreciated within the lumbar spine. Atherosclerotic calcifications identified within the aorta.  IMPRESSION: Nonobstructive bowel gas pattern with large amount of stool.   Electronically Signed   By: Salome Holmes M.D.   On: 10/01/2013 15:21    ASSESSMENT: [name] 80 y.o. female with a history of Cancer of head  of pancreas - Plan: CBC with Differential, Comprehensive metabolic panel (Cmet) - [facility], CBC with Differential, Comprehensive metabolic panel (Cmet) - [facility]   PLAN:  1. Pancreatic Adenocarcinoma, newly diagnosed starting chemoradiation on 11/03/2013.  --We reviewed extensively her pathology and CT of abdomen as noted above consistent with localized pancreatic adenocarcinoma. Her CT of chest is negative for metastases.  Her CA 19 9 is elevated 640.6. Her functional status is relatively good with adequate organ function. She would make a good candidate for chemoradiation. We then explained options for chemotherapy including gemcitabine alone or gemcitabine plus albumin-bound paclitaxel (Abraxane). We explained that with chemoradiation approaches, chemotherapy is used as a radiosensitizer, increasing the toxicity of radiation to the pancreatic cancer, most commonly used in the adjuvant setting. The role of neoadjuvant regimens is less defined based on randomized trials.  --Based on her age, we will consider RT (50.4 Gy in 28 fractions) plus Gemcitabine (500 mg/m2 q weekly x 6 --dosed decreased from 600 mg/mg2 in original study) followed by 5 cycles of Gemcitabine alone (1,000 mg/m2 weekly x 3 every 4 weeks). Given her age, doses may require modification.  Gemcitabine plus involved field RT was associated with an improved overall survival compared to gemcitabine alone for localized, unresectable pancreatic cancer. The reference is Loehrer, P.J., JCO, 2008, Vol 26, No 15S (May 20 Supplement, 2008), 4506). We provided the indications, benefits and side-effects of treatment and she agreed to proceed with chemoradiation.  Indications were for control of her disease and possible decreasing the tumor size which might potentially make surgery more of an option; benefits also might include improvement in quality of life measurements and improvement in overall survival; and side-effects include but are not limited to nausea,  vomiting, fevers, myelosuppression which might result in life-threatening infections, gastrointestinal disturbances, such as diarrhea and constipation.   Weekly gemcitabine chemotherapy started on 11/03/2013.   2. Elevated transaminases, mild.  --Likely secondary to the above. Improved, will continue to monitor labs weekly.    Bilirubin is within normal limits. No dose adjustment necessary.  If Bilirubin is greater than 1.6, we will decrease dose by 20%.   3. Family history of cancers.  --Given extensive breast cancer and other cancer in the family, a referral for genetic counseling has been made. Results are pending  4. Follow-up.  --Patient will follow up in 2 weeks.  She will have labs weekly.   Patient reviewed with Dr. Rosie Fate. We will have to determine whether to schedule restaging CT scans 2 or 4 weeks after she has finished her course of concurrent  chemoradiation.  All questions were answered. The patient knows to call the clinic with any problems, questions or concerns. We can certainly see the patient much sooner if necessary.  I spent 20 minutes counseling the patient face to face. The total time spent in the appointment was 30 minutes.    Carlton Adam, PA-C 11/24/2013 5:19 PM

## 2013-11-24 NOTE — Telephone Encounter (Signed)
gv an printed appt sched and avs for pt for April adn May.Marland KitchenMarland KitchenMarland Kitchen

## 2013-11-25 ENCOUNTER — Ambulatory Visit
Admission: RE | Admit: 2013-11-25 | Discharge: 2013-11-25 | Disposition: A | Discharge status: Home / Self Care | Payer: Medicare Other | Source: Ambulatory Visit | Attending: Radiation Oncology | Admitting: Radiation Oncology

## 2013-11-25 NOTE — Patient Instructions (Signed)
Continue your course of concurrent chemoradiation as scheduled Followup in 2 weeks 

## 2013-11-26 ENCOUNTER — Ambulatory Visit
Admission: RE | Admit: 2013-11-26 | Discharge: 2013-11-26 | Disposition: A | Discharge status: Home / Self Care | Payer: Medicare Other | Source: Ambulatory Visit | Attending: Radiation Oncology | Admitting: Radiation Oncology

## 2013-11-26 ENCOUNTER — Ambulatory Visit (INDEPENDENT_AMBULATORY_CARE_PROVIDER_SITE_OTHER): Payer: Medicare Other | Admitting: Emergency Medicine

## 2013-11-26 ENCOUNTER — Encounter: Payer: Self-pay | Admitting: Radiation Oncology

## 2013-11-26 ENCOUNTER — Encounter: Payer: Self-pay | Admitting: Emergency Medicine

## 2013-11-26 VITALS — BP 124/60 | HR 102 | Temp 97.8°F | Resp 16 | Ht 61.0 in | Wt 108.0 lb

## 2013-11-26 VITALS — BP 113/73 | HR 108 | Temp 97.6°F | Ht 61.0 in | Wt 106.9 lb

## 2013-11-26 DIAGNOSIS — E119 Type 2 diabetes mellitus without complications: Secondary | ICD-10-CM

## 2013-11-26 DIAGNOSIS — R110 Nausea: Secondary | ICD-10-CM

## 2013-11-26 DIAGNOSIS — C250 Malignant neoplasm of head of pancreas: Secondary | ICD-10-CM

## 2013-11-26 NOTE — Progress Notes (Signed)
Subjective:    Patient ID: [name], female    DOB: 1927-10-01, 80 y.o.   MRN: [id number]  HPI Comments: 80 yo newly diagnosed DM with pancreatic cancer. She had chemo on Monday and has radiation daily. She is feeling nauseated despite medications. She is not eating routinely due to nausea/ schedule. She notes BS AVG 133 ranges from 90-150s. She is feeling mildly fatigued. Her labs 4/20 show low sodium/ chloride.     Medication List       This list is accurate as of: 11/26/13 12:40 PM.  Always use your most recent med list.               BLOOD GLUCOSE MONITOR KIT Kit  by Does not apply route. relion brand     DEXILANT 60 MG capsule  Generic drug:  dexlansoprazole  Take 60 mg by mouth daily.     estradiol 2 MG vaginal ring  Commonly known as:  ESTRING  Place 2 mg vaginally every 3 (three) months. follow package directions     Fish Oil 1000 MG Caps  Take 1,000 mg by mouth daily.     FLAXSEED OIL PO  Take 5 mLs by mouth daily.     hyaluronate sodium Gel  Apply 1 application topically 2 (two) times daily. Apply to  Affected skin  Area after rad tx and bedtime     hydrochlorothiazide 25 MG tablet  Commonly known as:  HYDRODIURIL  Take 25 mg by mouth daily as needed (swelling).     levothyroxine 100 MCG tablet  Commonly known as:  SYNTHROID, LEVOTHROID  Take 50-100 mcg by mouth See admin instructions. Pt takes 1/2 tab for 50 mcg dose on Tuesday,thursday,saturday,sunday. 100 mcg  On Monday, Wednesday,friday     Magnesium 250 MG Tabs  Take 250 mg by mouth daily.     metFORMIN 500 MG 24 hr tablet  Commonly known as:  GLUCOPHAGE-XR  Take 1,000 mg by mouth 2 (two) times daily.     metoprolol succinate 25 MG 24 hr tablet  Commonly known as:  TOPROL-XL  Take 25 mg by mouth daily.     multivitamin tablet  Take 1 tablet by mouth daily.     ondansetron 8 MG tablet  Commonly known as:  ZOFRAN  Take 1 tablet (8 mg total) by mouth 2 (two) times daily as needed (Nausea  or vomiting).     prochlorperazine 10 MG tablet  Commonly known as:  COMPAZINE  Take 1 tablet (10 mg total) by mouth every 6 (six) hours as needed (Nausea or vomiting).     sucralfate 1 G tablet  Commonly known as:  CARAFATE  Take 1 tablet (1 g total) by mouth 2 (two) times daily.     TRUEPLUS LANCETS 33G Misc     Vitamin D-3 5000 UNITS Tabs  Take by mouth daily.          Review of Systems  Constitutional: Positive for appetite change and fatigue.  Gastrointestinal: Positive for nausea.  All other systems reviewed and are negative.  BP 124/60  Pulse 102  Temp(Src) 97.8 F (36.6 C) (Temporal)  Resp 16  Ht $R'5\' 1"'vV$  (1.549 m)  Wt 108 lb (48.988 kg)  BMI 20.42 kg/m2     Objective:   Physical Exam  Nursing note and vitals reviewed. Constitutional: She is oriented to person, place, and time. She appears well-developed and well-nourished.  HENT:  Head: Normocephalic and atraumatic.  Eyes: Conjunctivae are normal.  Neck: Normal range of motion.  Cardiovascular: Normal rate, regular rhythm, normal heart sounds and intact distal pulses.   Pulmonary/Chest: Effort normal and breath sounds normal.  Abdominal: Soft. Bowel sounds are normal. She exhibits no distension. There is no tenderness.  Musculoskeletal: Normal range of motion.  With cain  Neurological: She is alert and oriented to person, place, and time.  Skin: Skin is warm and dry.  Mild decreased turgor  Psychiatric: She has a normal mood and affect. Judgment normal.          Assessment & Plan:  1. DM- ADvised continue RX same. Reviewed recent labs  2. Nausea/ diarrhea with pancreatic cancer and chemo-  Restora SX #1 box given AD, Sx Dexilant AD x 5 boxes given  3. Fatigue vs mild dehydration- reviewed labs she will have recheck on Monday advised if any increase symptoms she may need IV Hydration at hospital or ER.

## 2013-11-26 NOTE — Patient Instructions (Signed)
Dehydration, Adult  Dehydration means your body does not have as much fluid as it needs. Your kidneys, brain, and heart will not work properly without the right amount of fluids and salt.   HOME CARE   Ask your doctor how to replace body fluid losses (rehydrate).   Drink enough fluids to keep your pee (urine) clear or pale yellow.   Drink small amounts of fluids often if you feel sick to your stomach (nauseous) or throw up (vomit).   Eat like you normally do.   Avoid:   Foods or drinks high in sugar.   Bubbly (carbonated) drinks.   Juice.   Very hot or cold fluids.   Drinks with caffeine.   Fatty, greasy foods.   Alcohol.   Tobacco.   Eating too much.   Gelatin desserts.   Wash your hands to avoid spreading germs (bacteria, viruses).   Only take medicine as told by your doctor.   Keep all doctor visits as told.  GET HELP RIGHT AWAY IF:    You cannot drink something without throwing up.   You get worse even with treatment.   Your vomit has blood in it or looks greenish.   Your poop (stool) has blood in it or looks black and tarry.   You have not peed in 6 to 8 hours.   You pee a small amount of very dark pee.   You have a fever.   You pass out (faint).   You have belly (abdominal) pain that gets worse or stays in one spot (localizes).   You have a rash, stiff neck, or bad headache.   You get easily annoyed, sleepy, or are hard to wake up.   You feel weak, dizzy, or very thirsty.  MAKE SURE YOU:    Understand these instructions.   Will watch your condition.   Will get help right away if you are not doing well or get worse.  Document Released: 05/20/2009 Document Revised: 10/16/2011 Document Reviewed: 03/13/2011  [employer] Patient Information 2014 [employer].

## 2013-11-26 NOTE — Progress Notes (Signed)
Department of Radiation Oncology  Phone:  [phone] Fax:        [phone]  Weekly Treatment Note    Name: [name] Date: 11/26/2013 MRN: [id number] DOB: 03/09/1928   Current dose: 32.4 Gy  Current fraction: 18   MEDICATIONS: Current Outpatient Prescriptions  Medication Sig Dispense Refill  . Blood Glucose Monitoring Suppl (BLOOD GLUCOSE MONITOR KIT) KIT by Does not apply route. relion brand      . Cholecalciferol (VITAMIN D-3) 5000 UNITS TABS Take by mouth daily.       Marland Kitchen dexlansoprazole (DEXILANT) 60 MG capsule Take 60 mg by mouth daily.      Marland Kitchen estradiol (ESTRING) 2 MG vaginal ring Place 2 mg vaginally every 3 (three) months. follow package directions  1 each  3  . Flaxseed, Linseed, (FLAXSEED OIL PO) Take 5 mLs by mouth daily.       . hyaluronate sodium (RADIAPLEXRX) GEL Apply 1 application topically 2 (two) times daily. Apply to  Affected skin  Area after rad tx and bedtime      . hydrochlorothiazide (HYDRODIURIL) 25 MG tablet Take 25 mg by mouth daily as needed (swelling).       Marland Kitchen levothyroxine (SYNTHROID, LEVOTHROID) 100 MCG tablet Take 50-100 mcg by mouth See admin instructions. Pt takes 1/2 tab for 50 mcg dose on Tuesday,thursday,saturday,sunday. 100 mcg  On Monday, Wednesday,friday      . Magnesium 250 MG TABS Take 250 mg by mouth daily.       . metFORMIN (GLUCOPHAGE-XR) 500 MG 24 hr tablet Take 1,000 mg by mouth 2 (two) times daily.      . metoprolol succinate (TOPROL-XL) 25 MG 24 hr tablet Take 25 mg by mouth daily.      . Multiple Vitamin (MULTIVITAMIN) tablet Take 1 tablet by mouth daily.      . Omega-3 Fatty Acids (FISH OIL) 1000 MG CAPS Take 1,000 mg by mouth daily.       . ondansetron (ZOFRAN) 8 MG tablet Take 1 tablet (8 mg total) by mouth 2 (two) times daily as needed (Nausea or vomiting).  30 tablet  1  . prochlorperazine (COMPAZINE) 10 MG tablet Take 1 tablet (10 mg total) by mouth every 6 (six) hours as needed (Nausea or vomiting).  30 tablet  1    . sucralfate (CARAFATE) 1 G tablet Take 1 tablet (1 g total) by mouth 2 (two) times daily.  60 tablet  5  . TRUEPLUS LANCETS 33G MISC        No current facility-administered medications for this encounter.     ALLERGIES: Aspirin; Gemfibrozil; Nsaids; Pravastatin; and Pse hcl-dm hbr-guaifenesin tan   LABORATORY DATA:  Lab Results  Component Value Date   WBC 2.4* 11/24/2013   HGB 11.7 11/24/2013   HCT 33.9* 11/24/2013   MCV 93.1 11/24/2013   PLT 143* 11/24/2013   Lab Results  Component Value Date   NA 133* 11/24/2013   K 4.0 11/24/2013   CL 94* 10/15/2013   CO2 25 11/24/2013   Lab Results  Component Value Date   ALT 12 11/24/2013   AST 19 11/24/2013   ALKPHOS 136 11/24/2013   BILITOT 0.55 11/24/2013     NARRATIVE: Angeliah W Franklin was seen today for weekly treatment management. The chart was checked and the patient's films were reviewed. The patient continues to have some ongoing nausea/decreased appetite. She asked to be seen today. Nausea medication is working adequately.  PHYSICAL EXAMINATION: height is $RemoveB'5\' 1"'McUHRFJA$  (1.549  m) and weight is 106 lb 14.4 oz (48.49 kg). Her temperature is 97.6 F (36.4 C). Her blood pressure is 113/73 and her pulse is 108.        ASSESSMENT: The patient is doing satisfactorily with treatment.  PLAN: We will continue with the patient's radiation treatment as planned. The patient's vital signs were reviewed with her today. I encouraged increased fluid intake. She was not significantly orthostatic today but we will continue to monitor this. No indication for IV fluids at this time.

## 2013-11-26 NOTE — Progress Notes (Signed)
Ms. Barsky seen today by her the PA, Kelby Aline who thought she may need IV hydration since her Sodium was low at 133, Chloride 96,  Potassium WNL at 4.0 and the Albumin is low at 3.4.  She reports that she is not eating well and "stays nauseated even when she smells it.  She is maintaining her weight

## 2013-11-27 ENCOUNTER — Encounter: Payer: Self-pay | Admitting: *Deleted

## 2013-11-27 ENCOUNTER — Ambulatory Visit
Admission: RE | Admit: 2013-11-27 | Discharge: 2013-11-27 | Disposition: A | Discharge status: Home / Self Care | Payer: Medicare Other | Source: Ambulatory Visit | Attending: Radiation Oncology | Admitting: Radiation Oncology

## 2013-11-27 NOTE — Progress Notes (Signed)
[place] Work  Clinical Social Work met with pt and provided resources about CNAs and home care at radiation appointment. Pt very appreciative. No other needs currently, all questions answered.    Clinical Social Work interventions: Home care resources.  Loren Racer, LCSW Clinical Social Worker [facility] [facility] [place] Wednesday, Thursday and Friday Phone: [phone] Fax: [phone]

## 2013-11-27 NOTE — Progress Notes (Signed)
[place] Work  Clinical Social Work received call from patient requesting assistance with home care resources.  CSW will provide patient with information before radiation appointment.  Polo Riley, MSW, LCSW, OSW-C Clinical Social Worker [facility] [phone]

## 2013-11-28 ENCOUNTER — Ambulatory Visit: Payer: Medicare Other | Admitting: Radiation Oncology

## 2013-11-28 ENCOUNTER — Ambulatory Visit
Admission: RE | Admit: 2013-11-28 | Discharge: 2013-11-28 | Disposition: A | Discharge status: Home / Self Care | Payer: Medicare Other | Source: Ambulatory Visit | Attending: Radiation Oncology | Admitting: Radiation Oncology

## 2013-12-01 ENCOUNTER — Ambulatory Visit
Admission: RE | Admit: 2013-12-01 | Discharge: 2013-12-01 | Disposition: A | Discharge status: Home / Self Care | Payer: Medicare Other | Source: Ambulatory Visit | Attending: Radiation Oncology | Admitting: Radiation Oncology

## 2013-12-01 ENCOUNTER — Ambulatory Visit (HOSPITAL_BASED_OUTPATIENT_CLINIC_OR_DEPARTMENT_OTHER): Payer: Medicare Other

## 2013-12-01 ENCOUNTER — Other Ambulatory Visit: Payer: Self-pay | Admitting: Internal Medicine

## 2013-12-01 ENCOUNTER — Other Ambulatory Visit (HOSPITAL_BASED_OUTPATIENT_CLINIC_OR_DEPARTMENT_OTHER): Payer: Medicare Other

## 2013-12-01 DIAGNOSIS — C250 Malignant neoplasm of head of pancreas: Secondary | ICD-10-CM

## 2013-12-01 DIAGNOSIS — Z5111 Encounter for antineoplastic chemotherapy: Secondary | ICD-10-CM

## 2013-12-01 DIAGNOSIS — C259 Malignant neoplasm of pancreas, unspecified: Secondary | ICD-10-CM

## 2013-12-01 LAB — CBC WITH DIFFERENTIAL/PLATELET
BASO%: 0.2 % (ref 0.0–2.0)
Basophils Absolute: 0 10*3/uL (ref 0.0–0.1)
EOS%: 0.5 % (ref 0.0–7.0)
Eosinophils Absolute: 0 10*3/uL (ref 0.0–0.5)
HEMATOCRIT: 35.3 % (ref 34.8–46.6)
HGB: 11.7 g/dL (ref 11.6–15.9)
LYMPH%: 3.8 % — AB (ref 14.0–49.7)
MCH: 32 pg (ref 25.1–34.0)
MCHC: 33.1 g/dL (ref 31.5–36.0)
MCV: 96.9 fL (ref 79.5–101.0)
MONO#: 0.9 10*3/uL (ref 0.1–0.9)
MONO%: 30.7 % — AB (ref 0.0–14.0)
NEUT#: 1.8 10*3/uL (ref 1.5–6.5)
NEUT%: 64.8 % (ref 38.4–76.8)
PLATELETS: 180 10*3/uL (ref 145–400)
RBC: 3.64 10*6/uL — ABNORMAL LOW (ref 3.70–5.45)
RDW: 13.6 % (ref 11.2–14.5)
WBC: 2.8 10*3/uL — ABNORMAL LOW (ref 3.9–10.3)
lymph#: 0.1 10*3/uL — ABNORMAL LOW (ref 0.9–3.3)

## 2013-12-01 LAB — COMPREHENSIVE METABOLIC PANEL (CC13)
ALK PHOS: 120 U/L (ref 40–150)
ALT: 14 U/L (ref 0–55)
ANION GAP: 10 meq/L (ref 3–11)
AST: 20 U/L (ref 5–34)
Albumin: 3 g/dL — ABNORMAL LOW (ref 3.5–5.0)
BILIRUBIN TOTAL: 0.49 mg/dL (ref 0.20–1.20)
BUN: 12.1 mg/dL (ref 7.0–26.0)
CO2: 26 mEq/L (ref 22–29)
CREATININE: 0.8 mg/dL (ref 0.6–1.1)
Calcium: 9.4 mg/dL (ref 8.4–10.4)
Chloride: 93 mEq/L — ABNORMAL LOW (ref 98–109)
Glucose: 177 mg/dl — ABNORMAL HIGH (ref 70–140)
Potassium: 4.6 mEq/L (ref 3.5–5.1)
Sodium: 128 mEq/L — ABNORMAL LOW (ref 136–145)
Total Protein: 6 g/dL — ABNORMAL LOW (ref 6.4–8.3)

## 2013-12-01 MED ORDER — ONDANSETRON HCL 8 MG PO TABS
ORAL_TABLET | ORAL | Status: AC
  Filled 2013-12-01: qty 1

## 2013-12-01 MED ORDER — SODIUM CHLORIDE 0.9 % IV SOLN
INTRAVENOUS | Status: DC
  Administered 2013-12-01: 15:00:00 via INTRAVENOUS

## 2013-12-01 MED ORDER — ONDANSETRON HCL 8 MG PO TABS: 8.0000 mg | ORAL_TABLET | Freq: Once | ORAL | Status: DC

## 2013-12-01 MED ORDER — SODIUM CHLORIDE 0.9 % IV SOLN
Freq: Once | INTRAVENOUS | Status: AC
  Administered 2013-12-01: 14:00:00 via INTRAVENOUS

## 2013-12-01 MED ORDER — SODIUM CHLORIDE 0.9 % IV SOLN
500.0000 mg/m2 | Freq: Once | INTRAVENOUS | Status: AC
  Administered 2013-12-01: 722 mg via INTRAVENOUS
  Filled 2013-12-01: qty 18.99

## 2013-12-02 ENCOUNTER — Ambulatory Visit
Admission: RE | Admit: 2013-12-02 | Discharge: 2013-12-02 | Disposition: A | Discharge status: Home / Self Care | Payer: Medicare Other | Source: Ambulatory Visit | Attending: Radiation Oncology | Admitting: Radiation Oncology

## 2013-12-03 ENCOUNTER — Ambulatory Visit
Admission: RE | Admit: 2013-12-03 | Discharge: 2013-12-03 | Disposition: A | Discharge status: Home / Self Care | Payer: Medicare Other | Source: Ambulatory Visit | Attending: Radiation Oncology | Admitting: Radiation Oncology

## 2013-12-04 ENCOUNTER — Ambulatory Visit
Admission: RE | Admit: 2013-12-04 | Discharge: 2013-12-04 | Disposition: A | Discharge status: Home / Self Care | Payer: Medicare Other | Source: Ambulatory Visit | Attending: Radiation Oncology | Admitting: Radiation Oncology

## 2013-12-05 ENCOUNTER — Encounter: Payer: Self-pay | Admitting: Radiation Oncology

## 2013-12-05 ENCOUNTER — Ambulatory Visit
Admission: RE | Admit: 2013-12-05 | Discharge: 2013-12-05 | Disposition: A | Discharge status: Home / Self Care | Payer: Medicare Other | Source: Ambulatory Visit | Attending: Radiation Oncology | Admitting: Radiation Oncology

## 2013-12-05 VITALS — BP 97/73 | HR 116 | Temp 98.0°F | Resp 20 | Wt 106.2 lb

## 2013-12-05 DIAGNOSIS — C250 Malignant neoplasm of head of pancreas: Secondary | ICD-10-CM

## 2013-12-05 NOTE — Progress Notes (Signed)
Weekly rad txs, pancreas, very nauseated only takes zofran in am and pm, in am zofran helps, the mid day afternoon starts again, poor appetite, no wt. Loss though, fatigued, feels puny 4:28 PM

## 2013-12-05 NOTE — Progress Notes (Signed)
Department of Radiation Oncology  Phone:  [phone] Fax:        [phone]  Weekly Treatment Note    Name: [name] Date: 12/05/2013 MRN: [id number] DOB: 03/01/1928   Current dose: 45 Gy  Current fraction: 25   MEDICATIONS: Current Outpatient Prescriptions  Medication Sig Dispense Refill  . Blood Glucose Monitoring Suppl (BLOOD GLUCOSE MONITOR KIT) KIT by Does not apply route. relion brand      . Cholecalciferol (VITAMIN D-3) 5000 UNITS TABS Take by mouth daily.       Marland Kitchen dexlansoprazole (DEXILANT) 60 MG capsule Take 60 mg by mouth daily.      Marland Kitchen estradiol (ESTRING) 2 MG vaginal ring Place 2 mg vaginally every 3 (three) months. follow package directions  1 each  3  . Flaxseed, Linseed, (FLAXSEED OIL PO) Take 5 mLs by mouth daily.       . hyaluronate sodium (RADIAPLEXRX) GEL Apply 1 application topically 2 (two) times daily. Apply to  Affected skin  Area after rad tx and bedtime      . hydrochlorothiazide (HYDRODIURIL) 25 MG tablet Take 25 mg by mouth daily as needed (swelling).       Marland Kitchen levothyroxine (SYNTHROID, LEVOTHROID) 100 MCG tablet Take 50-100 mcg by mouth See admin instructions. Pt takes 1/2 tab for 50 mcg dose on Tuesday,thursday,saturday,sunday. 100 mcg  On Monday, Wednesday,friday      . Magnesium 250 MG TABS Take 250 mg by mouth daily.       . metFORMIN (GLUCOPHAGE-XR) 500 MG 24 hr tablet Take 1,000 mg by mouth 2 (two) times daily.      . metoprolol succinate (TOPROL-XL) 25 MG 24 hr tablet Take 25 mg by mouth daily.      . Multiple Vitamin (MULTIVITAMIN) tablet Take 1 tablet by mouth daily.      . Omega-3 Fatty Acids (FISH OIL) 1000 MG CAPS Take 1,000 mg by mouth daily.       . ondansetron (ZOFRAN) 8 MG tablet Take 1 tablet (8 mg total) by mouth 2 (two) times daily as needed (Nausea or vomiting).  30 tablet  1  . prochlorperazine (COMPAZINE) 10 MG tablet Take 1 tablet (10 mg total) by mouth every 6 (six) hours as needed (Nausea or vomiting).  30 tablet  1  .  sucralfate (CARAFATE) 1 G tablet Take 1 tablet (1 g total) by mouth 2 (two) times daily.  60 tablet  5  . TRUEPLUS LANCETS 33G MISC        No current facility-administered medications for this encounter.     ALLERGIES: Aspirin; Gemfibrozil; Nsaids; Pravastatin; and Pse hcl-dm hbr-guaifenesin tan   LABORATORY DATA:  Lab Results  Component Value Date   WBC 2.8* 12/01/2013   HGB 11.7 12/01/2013   HCT 35.3 12/01/2013   MCV 96.9 12/01/2013   PLT 180 12/01/2013   Lab Results  Component Value Date   NA 128* 12/01/2013   K 4.6 12/01/2013   CL 94* 10/15/2013   CO2 26 12/01/2013   Lab Results  Component Value Date   ALT 14 12/01/2013   AST 20 12/01/2013   ALKPHOS 120 12/01/2013   BILITOT 0.49 12/01/2013     NARRATIVE: [name] was seen today for weekly treatment management. The chart was checked and the patient's films were reviewed. The patient is experiencing some nausea. She is taking Zofran twice a day. Her nausea [place] as she goes through the day. She feels fatigued.  PHYSICAL EXAMINATION: weight  is 106 lb 3.2 oz (48.172 kg). Her oral temperature is 98 F (36.7 C). Her blood pressure is 97/73 and her pulse is 116. Her respiration is 20.        ASSESSMENT: The patient is doing satisfactorily with treatment.  PLAN: We will continue with the patient's radiation treatment as planned. The patient will increase her Zofran to every 8 hours. She is to let us know if this is not sufficient. She will finish next week after 3 more fractions.

## 2013-12-06 ENCOUNTER — Other Ambulatory Visit: Payer: Self-pay | Admitting: Internal Medicine

## 2013-12-08 ENCOUNTER — Ambulatory Visit (HOSPITAL_BASED_OUTPATIENT_CLINIC_OR_DEPARTMENT_OTHER): Payer: Medicare Other

## 2013-12-08 ENCOUNTER — Encounter: Payer: Self-pay | Admitting: Physician Assistant

## 2013-12-08 ENCOUNTER — Other Ambulatory Visit: Payer: Medicare Other

## 2013-12-08 ENCOUNTER — Ambulatory Visit
Admission: RE | Admit: 2013-12-08 | Discharge: 2013-12-08 | Disposition: A | Discharge status: Home / Self Care | Payer: Medicare Other | Source: Ambulatory Visit | Attending: Radiation Oncology | Admitting: Radiation Oncology

## 2013-12-08 ENCOUNTER — Other Ambulatory Visit (HOSPITAL_BASED_OUTPATIENT_CLINIC_OR_DEPARTMENT_OTHER): Payer: Medicare Other

## 2013-12-08 ENCOUNTER — Ambulatory Visit (HOSPITAL_BASED_OUTPATIENT_CLINIC_OR_DEPARTMENT_OTHER): Payer: Medicare Other | Admitting: Physician Assistant

## 2013-12-08 ENCOUNTER — Encounter: Payer: Self-pay | Admitting: Internal Medicine

## 2013-12-08 VITALS — BP 116/57 | HR 115 | Temp 97.7°F | Resp 19 | Ht 61.0 in | Wt 102.3 lb

## 2013-12-08 DIAGNOSIS — I10 Essential (primary) hypertension: Secondary | ICD-10-CM

## 2013-12-08 DIAGNOSIS — E039 Hypothyroidism, unspecified: Secondary | ICD-10-CM

## 2013-12-08 DIAGNOSIS — Z5111 Encounter for antineoplastic chemotherapy: Secondary | ICD-10-CM

## 2013-12-08 DIAGNOSIS — C259 Malignant neoplasm of pancreas, unspecified: Secondary | ICD-10-CM

## 2013-12-08 DIAGNOSIS — C250 Malignant neoplasm of head of pancreas: Secondary | ICD-10-CM

## 2013-12-08 LAB — CBC WITH DIFFERENTIAL/PLATELET
BASO%: 0.3 % (ref 0.0–2.0)
BASOS ABS: 0 10*3/uL (ref 0.0–0.1)
EOS%: 0.2 % (ref 0.0–7.0)
Eosinophils Absolute: 0 10*3/uL (ref 0.0–0.5)
HEMATOCRIT: 35.8 % (ref 34.8–46.6)
HEMOGLOBIN: 11.9 g/dL (ref 11.6–15.9)
LYMPH#: 0.1 10*3/uL — AB (ref 0.9–3.3)
LYMPH%: 2.1 % — ABNORMAL LOW (ref 14.0–49.7)
MCH: 32.3 pg (ref 25.1–34.0)
MCHC: 33.3 g/dL (ref 31.5–36.0)
MCV: 97 fL (ref 79.5–101.0)
MONO#: 0.8 10*3/uL (ref 0.1–0.9)
MONO%: 21.7 % — ABNORMAL HIGH (ref 0.0–14.0)
NEUT#: 2.7 10*3/uL (ref 1.5–6.5)
NEUT%: 75.7 % (ref 38.4–76.8)
Platelets: 179 10*3/uL (ref 145–400)
RBC: 3.69 10*6/uL — ABNORMAL LOW (ref 3.70–5.45)
RDW: 14 % (ref 11.2–14.5)
WBC: 3.5 10*3/uL — ABNORMAL LOW (ref 3.9–10.3)

## 2013-12-08 LAB — COMPREHENSIVE METABOLIC PANEL (CC13)
ALT: 14 U/L (ref 0–55)
AST: 21 U/L (ref 5–34)
Albumin: 2.7 g/dL — ABNORMAL LOW (ref 3.5–5.0)
Alkaline Phosphatase: 97 U/L (ref 40–150)
Anion Gap: 11 mEq/L (ref 3–11)
BUN: 13.8 mg/dL (ref 7.0–26.0)
CALCIUM: 9.2 mg/dL (ref 8.4–10.4)
CHLORIDE: 93 meq/L — AB (ref 98–109)
CO2: 26 mEq/L (ref 22–29)
Creatinine: 0.7 mg/dL (ref 0.6–1.1)
Glucose: 167 mg/dl — ABNORMAL HIGH (ref 70–140)
Potassium: 4.5 mEq/L (ref 3.5–5.1)
Sodium: 130 mEq/L — ABNORMAL LOW (ref 136–145)
Total Bilirubin: 0.49 mg/dL (ref 0.20–1.20)
Total Protein: 5.6 g/dL — ABNORMAL LOW (ref 6.4–8.3)

## 2013-12-08 LAB — TECHNOLOGIST REVIEW

## 2013-12-08 MED ORDER — SODIUM CHLORIDE 0.9 % IV SOLN
Freq: Once | INTRAVENOUS | Status: AC
  Administered 2013-12-08: 14:00:00 via INTRAVENOUS

## 2013-12-08 MED ORDER — ONDANSETRON HCL 8 MG PO TABS
ORAL_TABLET | ORAL | Status: AC
  Filled 2013-12-08: qty 1

## 2013-12-08 MED ORDER — ONDANSETRON HCL 8 MG PO TABS
8.0000 mg | ORAL_TABLET | Freq: Once | ORAL | Status: AC
  Administered 2013-12-08: 8 mg via ORAL

## 2013-12-08 MED ORDER — SODIUM CHLORIDE 0.9 % IV SOLN
500.0000 mg/m2 | Freq: Once | INTRAVENOUS | Status: AC
  Administered 2013-12-08: 722 mg via INTRAVENOUS
  Filled 2013-12-08: qty 18.99

## 2013-12-08 NOTE — Patient Instructions (Signed)
Complete your course of concurrent chemoradiation as scheduled Followup with Dr. [name] in 2 weeks a restaging CT scan of your abdomen and pelvis to reevaluate your disease

## 2013-12-08 NOTE — Progress Notes (Signed)
[place], [place] [address] [place] [address]  DIAGNOSIS: Cancer of head of pancreas - Plan: CT Abdomen Pelvis W Contrast, CBC with Differential, Comprehensive metabolic panel (Cmet) - [facility], Cancer antigen 19-9  No chief complaint on file.   CURRENT TREATMENT: Planning neoadjuvant chemoradiation starting on 03/30 - 12/10/2013.  Gemcitabine 500 mg/m2 weekly with concurrent XRT.      Pancreatic cancer   10/03/2013 Imaging CT of abdomen: Mass within the head and uncinate process of the pancreas obstructs both the common bile duct and pancreatic duct.  No evidence of mets   10/03/2013 - 10/05/2013 Hospital Admission Admitted to hospitalist team at [facility] hospital due to bstructive jaundice and transaminitis secondary to pancreatic head mass noted on outpatient CT abdomen and pelvis. GI consulted.    10/04/2013 Procedure ERCP (Dr. Benson Norway) performed.  Malignant CBD obstruction with brushings obtained. EUS planned.    10/04/2013 Pathology Diagnosis BILE DUCT BRUSHING, COMMON (SPECIMEN 1 OF 1 COLLECTED 10-04-2013)  ATYPICAL CELLS, HIGHLY SUSPICIOUS FOR MALIGNANCY.   10/04/2013 Tumor Marker CA 19 9, 640.8   10/07/2013 Procedure EUS (Dr. Benson Norway) performed revealing a 2.9 x 3.3 cm pancreatic head mass without portal vein invasion, dilated CBC to 1.5 cm with plastic stent, dilated PD to 1 cm. FNA obtained.   10/07/2013 Pathology FINE NEEDLE ASPIRATION, ENDOSCOPIC, PANCREAS HEAD(SPECIMEN 1 OF 1 COLLECTED 10/07/13): MALIGNANT CELLS CONSISTENT WITH ADENOCARCINOMA.   10/07/2013 Initial Diagnosis Pancreatic cancer   10/14/2013 Cancer Staging Seen by Dr. Barry Dienes who recommends chemoradiation. Referred to oncology and radiation oncology. CT of chest ordered to complete staging.    10/14/2013 Family History referring her to genetics since she has had breast cancer, and has significant family history of breast cancer and prostate cancer   10/17/2013 Imaging CT  of Chest: Negative for lung metastases.    INTERVAL HISTORY: [name] 80 y.o. female with a history of breast cancer s/p mastectomy of right breast who was referred by Dr. Barry Dienes of Surgical oncology for further evaluation of her newly diagnosed pancreatic adenocarcinoma. Today, she is accompanied by her husband. Overall she's tolerating her weekly gemcitabine and radiation therapy relatively well, with the exception of increasing fatigue and decreased appetite.. She's completed 5 weeks. She did have some issues with nausea and vomiting. She is controlling these symptoms relatively well with Zofran and Compazine. She also has had some issues with diarrhea but states that this is been an ongoing issue since the beginning of her diagnosis. She is taking cholestyramine powder as prescribed and uses an over-the-counter Imodium and-type medication on an as-needed basis with reasonable control of her symptoms. She voices no other specific complaints today.   As previously noted, she was evaluated by Dr. Barry Dienes for consideration of surgery for her localized pancreatic cancer  and recommended chemoradiation while the patient determines if she wants to proceed given the extent of the surgery required. She was seen by Dr. Lisbeth Renshaw on 03/18 and determined to be a good candidate for chemoradiation.  She had a CT of chest which was negative. She is scheduled for her last fraction of radiation therapy on 12/10/2013   Patient was driving a few months ago and remains independent of her basic, intermediate and advanced activities of daily of living. She has a notable family history of cancer. Her daughter had breast cancer at age 85. Her sister passed away from breast cancer with metastatic disease to the bone. She met with the  genetic Counselor on 10/28/2013. She gave consent  To pursue genetic testing and a blood sample was sent to [facility] for analysis of the breast/ovarian cancer panel. Results dated  11/10/2013 were negative.   MEDICAL HISTORY: Past Medical History  Diagnosis Date  . Arthritis     Left knee, s/p cortisone injection  . GERD (gastroesophageal reflux disease)   . Complication of anesthesia     Very hard to wake up after anesthesia  . Dizziness   . Seasonal allergies   . Frequency of urination   . Dysrhythmia     Sinus tachycardia can get up to 108. Started after chemo   . Hypertension   . Hyperlipidemia   . Thyroid disease   . Hypothyroidism   . Breast cancer 2001    S/P mastectomy, XRT and chemo  . Cancer 10/07/13    pancreatic cancer/adenocarcinoma    INTERIM HISTORY: has Hypertension; Hypothyroidism; GERD (gastroesophageal reflux disease); Cholelithiasis with choledocholithiasis; Hyperlipidemia; Breast cancer; Encounter for long-term (current) use of other medications; Transaminitis; T2 NIDDM; Pancreatic cancer; and Cancer of head of pancreas on her problem list.    ALLERGIES:  is allergic to aspirin; gemfibrozil; nsaids; pravastatin; and pse hcl-dm hbr-guaifenesin tan.  MEDICATIONS: has a current medication list which includes the following prescription(s): blood glucose monitor kit, vitamin d-3, dexlansoprazole, estradiol, flaxseed (linseed), hyaluronate sodium, hydrochlorothiazide, levothyroxine, magnesium, metformin, metoprolol succinate, ondansetron, sucralfate, trueplus lancets 33g, multivitamin, fish oil, and prochlorperazine.  SURGICAL HISTORY:  Past Surgical History  Procedure Laterality Date  . Joint replacement      right knee  . Carpal tunnel release      bilateral  . Incontinence surgery    . Cataract extraction      left  . Mastectomy      right due to cancer  . Ercp  10/06/2011    Procedure: ENDOSCOPIC RETROGRADE CHOLANGIOPANCREATOGRAPHY (ERCP);  Surgeon: Gatha Mayer, MD;  Location: [facility];  Service: Endoscopy;  Laterality: N/A;  or general per anesthesia  . Sphincterotomy  10/06/2011    Procedure: SPHINCTEROTOMY;  Surgeon: Gatha Mayer, MD;  Location: [facility];  Service: Endoscopy;;  . Cholecystectomy  10/07/2011    Procedure: LAPAROSCOPIC CHOLECYSTECTOMY WITH INTRAOPERATIVE CHOLANGIOGRAM;  Surgeon: Edward Jolly, MD;  Location: [facility];  Service: General;  Laterality: N/A;  . Tonsillectomy    . Colonoscopy    . Eye surgery    . Lumbar laminectomy/decompression microdiscectomy  05/27/2012    Procedure: LUMBAR LAMINECTOMY/DECOMPRESSION MICRODISCECTOMY 1 LEVEL;  Surgeon: Ophelia Charter, MD;  Location: [facility];  Service: Neurosurgery;  Laterality: Bilateral;  Lumbar four laminectomy, with bilateral lumbar three laminotomies.  . Ercp N/A 10/04/2013    Procedure: ENDOSCOPIC RETROGRADE CHOLANGIOPANCREATOGRAPHY (ERCP);  Surgeon: Beryle Beams, MD;  Location: [facility];  Service: Gastroenterology;  Laterality: N/A;  . Eus N/A 10/07/2013    Procedure: UPPER ENDOSCOPIC ULTRASOUND (EUS) LINEAR;  Surgeon: Beryle Beams, MD;  Location: [facility];  Service: Endoscopy;  Laterality: N/A;    REVIEW OF SYSTEMS:   Constitutional: Denies fevers, chills or abnormal weight loss. Reports fatigue and decreased appetite as well as decreased po intake. Eyes: Denies blurriness of vision Ears, nose, mouth, throat, and face: Denies mucositis or sore throat Respiratory: Denies cough, dyspnea or wheezes Cardiovascular: Denies palpitation, chest discomfort or lower extremity swelling Gastrointestinal:  Positive for nausea, vomiting and diarrhea, denies heartburn  Skin: Denies abnormal skin rashes Lymphatics: Denies new lymphadenopathy or easy bruising Neurological:Denies numbness, tingling  or new weaknesses Behavioral/Psych: Mood is stable, no new changes  All other systems were reviewed with the patient and are negative.  PHYSICAL EXAMINATION: ECOG PERFORMANCE STATUS: 1 - Symptomatic but completely ambulatory  Blood pressure 116/57, pulse 115, temperature 97.7 F (36.5 C), temperature source Oral, resp. rate 19, height _0   (1.549 m), weight 102 lb 4.8 oz (46.403 kg).  GENERAL:alert, no distress and comfortable; presents in a wheelchair   SKIN: skin color, texture, turgor are normal, no rashes or significant lesions EYES: normal, Conjunctiva are pink and non-injected, sclera clear OROPHARYNX:no exudate, no erythema and lips, buccal mucosa, and tongue normal  NECK: supple, thyroid normal size, non-tender, without nodularity LYMPH:  no palpable lymphadenopathy in the cervical, axillary or supraclavicular LUNGS: clear to auscultation with normal breathing effort, no wheezes or rhonchi HEART: regular rate & rhythm and no murmurs and no lower extremity edema ABDOMEN:abdomen soft, non-tender and normal bowel sounds Musculoskeletal:no cyanosis of digits and no clubbing  NEURO: alert & oriented x 3 with fluent speech, no focal motor/sensory deficits  Labs:  Lab Results  Component Value Date   WBC 3.5* 12/08/2013   HGB 11.9 12/08/2013   HCT 35.8 12/08/2013   MCV 97.0 12/08/2013   PLT 179 12/08/2013   NEUTROABS 2.7 12/08/2013      Chemistry      Component Value Date/Time   NA 130* 12/08/2013 1150   NA 132* 10/15/2013 1313   K 4.5 12/08/2013 1150   K 4.3 10/15/2013 1313   CL 94* 10/15/2013 1313   CO2 26 12/08/2013 1150   CO2 26 10/15/2013 1313   BUN 13.8 12/08/2013 1150   BUN 15 10/15/2013 1313   CREATININE 0.7 12/08/2013 1150   CREATININE 0.69 10/15/2013 1313   CREATININE 0.73 10/05/2013 0423      Component Value Date/Time   CALCIUM 9.2 12/08/2013 1150   CALCIUM 9.7 10/15/2013 1313   ALKPHOS 97 12/08/2013 1150   ALKPHOS 493* 10/15/2013 1313   AST 21 12/08/2013 1150   AST 30 10/15/2013 1313   ALT 14 12/08/2013 1150   ALT 116* 10/15/2013 1313   BILITOT 0.49 12/08/2013 1150   BILITOT 1.5* 10/15/2013 1313      CBC:  Recent Labs Lab 12/08/13 1150  WBC 3.5*  NEUTROABS 2.7  HGB 11.9  HCT 35.8  MCV 97.0  PLT 179   Studies:  No results found.   RADIOGRAPHIC STUDIES: Dg Chest 2 View  10/01/2013   CLINICAL DATA:  Abdominal  discomfort, weakness  EXAM: CHEST  2 VIEW  COMPARISON:  05/22/2012  FINDINGS: Cardiomediastinal silhouette is stable. Again noted status post right mastectomy. Degenerative changes and levoscoliosis lumbar spine. Stable degenerative changes lower thoracic spine. Mild hyperinflation again noted. No acute infiltrate or pleural effusion. No pulmonary edema.  IMPRESSION: No active disease. Status post right mastectomy. Lumbar levoscoliosis.   Electronically Signed   By: Lahoma Crocker M.D.   On: 10/01/2013 15:12   Ct Chest W Contrast  10/20/2013   CLINICAL DATA:  Pancreatic cancer. Evaluate metastatic disease. History of right breast cancer.  EXAM: CT CHEST WITH CONTRAST  TECHNIQUE: Multidetector CT imaging of the chest was performed during intravenous contrast administration.  CONTRAST:  3m OMNIPAQUE IOHEXOL 300 MG/ML  SOLN  COMPARISON:  CT ABD/PELVIS W CM dated 10/03/2013  FINDINGS: No pathologically enlarged mediastinal, hilar or axillary lymph nodes. No internal mammary adenopathy. Heart size normal. Three-vessel coronary artery calcification. No pericardial effusion.  Biapical pleural parenchymal scarring. Mild subpleural radiation fibrosis  in the anterior right hemi thorax. Findings are worst in the right middle lobe. Mild scattered scarring. Lungs are otherwise clear. No pleural fluid. Airway is unremarkable.  Incidental imaging of the upper abdomen shows pneumobilia with a stent in the common bile duct. Biliary ductal dilatation is again seen and partially imaged. Marked pancreatic ductal dilatation with atrophy of the body and tail. The primary pancreatic mass in the head and uncinate process, as seen on 10/03/2013, was not imaged today. Visualized portions of the adrenal glands, kidneys, spleen and stomach are grossly unremarkable. No worrisome lytic or sclerotic lesions. Degenerative changes are seen in the spine.  IMPRESSION: 1. No evidence of metastatic disease in the chest. 2. Three-vessel coronary artery  calcification. 3. Interval common bile duct stent placement with pneumobilia and persistent marked dilatation of the biliary tree and pancreatic duct, incompletely imaged. Pancreatic mass was not imaged on the current exam. Please refer to examination of 10/03/2013 for further details.   Electronically Signed   By: Lorin Picket M.D.   On: 10/20/2013 15:40   Ct Abdomen Pelvis W Contrast  10/03/2013   CLINICAL DATA:  Mid to lower abdominal pain  EXAM: CT ABDOMEN AND PELVIS WITH CONTRAST  TECHNIQUE: Multidetector CT imaging of the abdomen and pelvis was performed using the standard protocol following bolus administration of intravenous contrast.  CONTRAST:  115m OMNIPAQUE IOHEXOL 300 MG/ML  SOLN  COMPARISON:  05/22/2013  FINDINGS: There is no pleural or pericardial effusion identified. There is diffuse intrahepatic bile duct dilatation. The common bile duct is markedly dilated measuring up to 2.5 cm, image 24/series 2. There is a mass within the head and uncinate process of the pancreas which measures 3.1 cm, image 25/ series 602. There is not only obstruction of the common bile duct, but the pancreatic duct is also obstructed and dilated. The mass appears to touch but does not encase the superior mesenteric artery, image 29/series 2. The mass also appears to abut the posterior aspect of the portal vein, image 27/series 2. The lumen of the portal vein and superior mesenteric arteries are not compromised. The celiac artery appears uninvolved the spleen is normal. The splenic vein is patent  The adrenal glands both appear normal. Normal appearance of both kidneys. The urinary bladder appears normal. A pessary device is identified within the dome of vagina. Next  Calcified atherosclerotic disease involves the abdominal aorta. There is no aneurysm. No upper abdominal adenopathy. There is no pelvic or inguinal adenopathy identified.  The stomach is normal. The small bowel loops have a normal appearance without  obstruction. The appendix is visualized and appears normal. Normal appearance of the colon.  Review of the visualized bony structures is significant for scoliosis and multi level degenerative disc disease. No aggressive lytic or sclerotic bone lesions.  IMPRESSION: 1. Mass within the head and uncinate process of the pancreas obstructs both the common bile duct and pancreatic duct. 2. No specific features identified to suggest metastatic disease.   Electronically Signed   By: TKerby MoorsM.D.   On: 10/03/2013 13:49   Dg Ercp  10/04/2013   CLINICAL DATA:  Obstructive jaundice, ERCP with stent placement  EXAM: ERCP  TECHNIQUE: Multiple spot images obtained with the fluoroscopic device and submitted for interpretation post-procedure.  COMPARISON:  CT ABD/PELVIS W CM dated 10/03/2013; DG ABD 2 VIEWS dated 10/01/2013; CT ABD/PELV WO CM dated 05/22/2013  FINDINGS: Two spot intraoperative fluoroscopic images during ERCP are provided for review.  Initial image demonstrates an  ERCP probe overlying the right upper quadrant with selective cannulation of the common bile duct. There is minimal opacification of the intrahepatic biliary system which appears moderate to severely dilated centrally as demonstrated on recently performed abdominal CT.  Subsequent image demonstrates deployment of a plastic internal biliary stent overlying the distal aspect of the common bile duct.  Post cholecystectomy.  Enteric contrast is seen within the colon.  IMPRESSION: ERCP with plastic biliary stent placement as above.  These images were submitted for radiologic interpretation only. Please see the procedural report for the amount of contrast and the fluoroscopy time utilized.   Electronically Signed   By: Sandi Mariscal M.D.   On: 10/04/2013 11:59   Dg Abd 2 Views  10/01/2013   CLINICAL DATA:  Abdominal pain  EXAM: ABDOMEN - 2 VIEW  COMPARISON:  CT ABD/PELV WO CM dated 05/22/2013; DG LUMBAR SPINE 1 VIEW dated 05/27/2012  FINDINGS: There is a  paucity of bowel gas. A moderate to large amount of stool is appreciated. Severe levoscoliosis appreciated within the lumbar spine. Atherosclerotic calcifications identified within the aorta.  IMPRESSION: Nonobstructive bowel gas pattern with large amount of stool.   Electronically Signed   By: Margaree Mackintosh M.D.   On: 10/01/2013 15:21    ASSESSMENT: [name] 80 y.o. female with a history of Cancer of head of pancreas - Plan: CT Abdomen Pelvis W Contrast, CBC with Differential, Comprehensive metabolic panel (Cmet) - [facility], Cancer antigen 19-9   PLAN:  1. Pancreatic Adenocarcinoma, newly diagnosed starting chemoradiation on 11/03/2013.  --We reviewed extensively her pathology and CT of abdomen as noted above consistent with localized pancreatic adenocarcinoma. Her CT of chest is negative for metastases.  Her CA 19 9 is elevated 640.6. Her functional status is relatively good with adequate organ function. She would make a good candidate for chemoradiation. We then explained options for chemotherapy including gemcitabine alone or gemcitabine plus albumin-bound paclitaxel (Abraxane). We explained that with chemoradiation approaches, chemotherapy is used as a radiosensitizer, increasing the toxicity of radiation to the pancreatic cancer, most commonly used in the adjuvant setting. The role of neoadjuvant regimens is less defined based on randomized trials.  --Based on her age, we will consider RT (50.4 Gy in 28 fractions) plus Gemcitabine (500 mg/m2 q weekly x 6 --dosed decreased from 600 mg/mg2 in original study) followed by 5 cycles of Gemcitabine alone (1,000 mg/m2 weekly x 3 every 4 weeks). Given her age, doses may require modification.  Gemcitabine plus involved field RT was associated with an improved overall survival compared to gemcitabine alone for localized, unresectable pancreatic cancer. The reference is Loehrer, P.J., JCO, 2008, Vol 26, No 15S (May 20 Supplement, 2008), 4506). We provided the  indications, benefits and side-effects of treatment and she agreed to proceed with chemoradiation.  Indications were for control of her disease and possible decreasing the tumor size which might potentially make surgery more of an option; benefits also might include improvement in quality of life measurements and improvement in overall survival; and side-effects include but are not limited to nausea, vomiting, fevers, myelosuppression which might result in life-threatening infections, gastrointestinal disturbances, such as diarrhea and constipation.   Weekly gemcitabine chemotherapy started on 11/03/2013.  She is status post 5 weeks of concurrent chemoradiation. As stated above she will have her last fraction of radiation therapy on 12/10/2013. The patient was reviewed with Dr. [name]. We will plan a restaging CT scan approximately 2 weeks after she completes her last fraction of radiation  therapy.  2. Elevated transaminases, mild.  --Likely secondary to the above. Continued improvement - will continue to monitor.     Bilirubin is within normal limits. No dose adjustment necessary.  If Bilirubin is greater than 1.6, we will decrease dose by 20%.   3. Family history of cancers.  --Given extensive breast cancer and other cancer in the family, a referral for genetic counseling has been made.  Samples sent to [facility] for analysis of the breast/ovarian cancer panel were negative as reported on 11/10/2013. 4. Follow-up.  --Patient will follow up in 2 weeks with a restaging CT scan of the abdomen and pelvis with contrast to reevaluate her disease. We'll also obtain a CBC differential, C. met and CA 19.9.  Patient reviewed with Dr. [name].   All questions were answered. The patient knows to call the clinic with any problems, questions or concerns. We can certainly see the patient much sooner if necessary.  I spent 20 minutes counseling the patient face to face. The total time spent in the appointment  was 30 minutes.    Carlton Adam, PA-C 12/08/2013 1:20 PM

## 2013-12-08 NOTE — Patient Instructions (Signed)
[facility] Discharge Instructions for Patients Receiving Chemotherapy  Today you received the following chemotherapy agents Gemzar.  To help prevent nausea and vomiting after your treatment, we encourage you to take your nausea medication as prescribed.   If you develop nausea and vomiting that is not controlled by your nausea medication, call the clinic.   BELOW ARE SYMPTOMS THAT SHOULD BE REPORTED IMMEDIATELY:  *FEVER GREATER THAN 100.5 F  *CHILLS WITH OR WITHOUT FEVER  NAUSEA AND VOMITING THAT IS NOT CONTROLLED WITH YOUR NAUSEA MEDICATION  *UNUSUAL SHORTNESS OF BREATH  *UNUSUAL BRUISING OR BLEEDING  TENDERNESS IN MOUTH AND THROAT WITH OR WITHOUT PRESENCE OF ULCERS  *URINARY PROBLEMS  *BOWEL PROBLEMS  UNUSUAL RASH Items with * indicate a potential emergency and should be followed up as soon as possible.  Feel free to call the clinic you have any questions or concerns. The clinic phone number is [phone].    

## 2013-12-09 ENCOUNTER — Telehealth: Payer: Self-pay | Admitting: Dietician

## 2013-12-09 ENCOUNTER — Ambulatory Visit
Admission: RE | Admit: 2013-12-09 | Discharge: 2013-12-09 | Disposition: A | Discharge status: Home / Self Care | Payer: Medicare Other | Source: Ambulatory Visit | Attending: Radiation Oncology | Admitting: Radiation Oncology

## 2013-12-09 NOTE — Telephone Encounter (Signed)
Brief Outpatient Oncology Nutrition Note  Patient has been identified to be at risk on malnutrition screen.  Wt Readings from Last 10 Encounters:  12/08/13 102 lb 4.8 oz (46.403 kg)  12/05/13 106 lb 3.2 oz (48.172 kg)  11/26/13 106 lb 14.4 oz (48.49 kg)  11/26/13 108 lb (48.988 kg)  11/24/13 106 lb 6.4 oz (48.263 kg)  11/21/13 108 lb 4.8 oz (49.125 kg)  11/10/13 110 lb 14.4 oz (50.304 kg)  11/06/13 111 lb 4.8 oz (50.485 kg)  10/29/13 110 lb (49.896 kg)  10/27/13 112 lb (50.803 kg)      Dx:  Pancreatic Cancer  Called patient due to weight loss.  Patient reports nausea and is not able to eat much.  States that she is staying hydrated.  Drinks 1-2 Glucerna coupons a day.  Takes meds for nausea but is unable to eat more.  Mailed patient coupons, handouts, "Making the Most of Each Bite" and "Nausea and Vomiting" along with the contact information for the [facility].  Recommended an appointment with the RD and will schedule an appointment.  Antonieta Iba, RD, LDN

## 2013-12-10 ENCOUNTER — Encounter: Payer: Self-pay | Admitting: Radiation Oncology

## 2013-12-10 ENCOUNTER — Ambulatory Visit
Admission: RE | Admit: 2013-12-10 | Discharge: 2013-12-10 | Disposition: A | Discharge status: Home / Self Care | Payer: Medicare Other | Source: Ambulatory Visit | Attending: Radiation Oncology | Admitting: Radiation Oncology

## 2013-12-10 VITALS — BP 111/52 | HR 110 | Temp 98.0°F | Resp 20 | Wt 103.7 lb

## 2013-12-10 DIAGNOSIS — C250 Malignant neoplasm of head of pancreas: Secondary | ICD-10-CM

## 2013-12-10 NOTE — Progress Notes (Signed)
Department of Radiation Oncology  Phone:  [phone] Fax:        [phone]  Weekly Treatment Note    Name: [name] Date: 12/10/2013 MRN: [id number] DOB: [birth year]   Current dose: 50.4 Gy  Current fraction: 28   MEDICATIONS: Current Outpatient Prescriptions  Medication Sig Dispense Refill  . Blood Glucose Monitoring Suppl (BLOOD GLUCOSE MONITOR KIT) KIT by Does not apply route. relion brand      . Cholecalciferol (VITAMIN D-3) 5000 UNITS TABS Take by mouth daily.       Marland Kitchen dexlansoprazole (DEXILANT) 60 MG capsule Take 60 mg by mouth daily.      Marland Kitchen estradiol (ESTRING) 2 MG vaginal ring Place 2 mg vaginally every 3 (three) months. follow package directions  1 each  3  . Flaxseed, Linseed, (FLAXSEED OIL PO) Take 5 mLs by mouth daily.       . hyaluronate sodium (RADIAPLEXRX) GEL Apply 1 application topically 2 (two) times daily. Apply to  Affected skin  Area after rad tx and bedtime      . hydrochlorothiazide (HYDRODIURIL) 25 MG tablet Take 25 mg by mouth daily as needed (swelling).       Marland Kitchen levothyroxine (SYNTHROID, LEVOTHROID) 100 MCG tablet Take 50-100 mcg by mouth See admin instructions. Pt takes 1/2 tab for 50 mcg dose on Tuesday,thursday,saturday,sunday. 100 mcg  On Monday, Wednesday,friday      . Magnesium 250 MG TABS Take 250 mg by mouth daily.       . metFORMIN (GLUCOPHAGE-XR) 500 MG 24 hr tablet Take 1,000 mg by mouth 2 (two) times daily.      . metoprolol succinate (TOPROL-XL) 25 MG 24 hr tablet Take 25 mg by mouth daily.      . Multiple Vitamin (MULTIVITAMIN) tablet Take 1 tablet by mouth daily.      . Omega-3 Fatty Acids (FISH OIL) 1000 MG CAPS Take 1,000 mg by mouth daily.       . ondansetron (ZOFRAN) 8 MG tablet TAKE ONE TABLET BY MOUTH TWICE DAILY AS NEEDED FOR NAUSEA AND VOMITING  30 tablet  0  . prochlorperazine (COMPAZINE) 10 MG tablet Take 1 tablet (10 mg total) by mouth every 6 (six) hours as needed (Nausea or vomiting).  30 tablet  1  . sucralfate  (CARAFATE) 1 G tablet Take 1 tablet (1 g total) by mouth 2 (two) times daily.  60 tablet  5  . TRUEPLUS LANCETS 33G MISC        No current facility-administered medications for this encounter.     ALLERGIES: Aspirin; Gemfibrozil; Nsaids; Pravastatin; and Pse hcl-dm hbr-guaifenesin tan   LABORATORY DATA:  Lab Results  Component Value Date   WBC 3.5* 12/08/2013   HGB 11.9 12/08/2013   HCT 35.8 12/08/2013   MCV 97.0 12/08/2013   PLT 179 12/08/2013   Lab Results  Component Value Date   NA 130* 12/08/2013   K 4.5 12/08/2013   CL 94* 10/15/2013   CO2 26 12/08/2013   Lab Results  Component Value Date   ALT 14 12/08/2013   AST 21 12/08/2013   ALKPHOS 97 12/08/2013   BILITOT 0.49 12/08/2013     NARRATIVE: [name] was seen today for weekly treatment management. The chart was checked and the patient's films were reviewed. The patient denies any major changes over the last week. Some ongoing nausea with the patient continuing to use nausea medication as needed. The patient does complain of some fatigue.  PHYSICAL EXAMINATION:  weight is 103 lb 11.2 oz (47.038 kg). Her oral temperature is 98 F (36.7 C). Her blood pressure is 111/52 and her pulse is 110. Her respiration is 20.        ASSESSMENT: The patient did satisfactorily with treatment.  PLAN: The patient will followup in one month. I expect the patient's nausea/decreased appetite to improve over the next couple of weeks. She is to let us know if this does not improve.

## 2013-12-10 NOTE — Progress Notes (Signed)
Weekly rad txs, 28/28, completed pancreaq, still has nausea, taking medication now tid,helping some better stated , ate some grilled chicken toay and 1 can glucerna, working on drinking more water,takes sips all day, fatigued,weak still, loss 2 lbs, has Ct scheduled 12/24/13, some pain discomfort in upper gastric area stated, takes 1/4 tablet pain,it does help stated patient 4:16 PM

## 2013-12-12 ENCOUNTER — Telehealth: Payer: Self-pay | Admitting: Medical Oncology

## 2013-12-12 ENCOUNTER — Other Ambulatory Visit: Payer: Self-pay | Admitting: Medical Oncology

## 2013-12-12 ENCOUNTER — Ambulatory Visit (HOSPITAL_BASED_OUTPATIENT_CLINIC_OR_DEPARTMENT_OTHER): Payer: Medicare Other

## 2013-12-12 ENCOUNTER — Telehealth: Payer: Self-pay | Admitting: Internal Medicine

## 2013-12-12 ENCOUNTER — Encounter: Payer: Self-pay | Admitting: Medical Oncology

## 2013-12-12 VITALS — BP 132/66 | HR 98 | Temp 97.9°F | Resp 18

## 2013-12-12 DIAGNOSIS — C259 Malignant neoplasm of pancreas, unspecified: Secondary | ICD-10-CM

## 2013-12-12 DIAGNOSIS — R110 Nausea: Secondary | ICD-10-CM

## 2013-12-12 DIAGNOSIS — C250 Malignant neoplasm of head of pancreas: Secondary | ICD-10-CM

## 2013-12-12 MED ORDER — ONDANSETRON 8 MG/50ML IVPB (CHCC)
8.0000 mg | INTRAVENOUS | Status: AC
  Administered 2013-12-12: 8 mg via INTRAVENOUS

## 2013-12-12 MED ORDER — SODIUM CHLORIDE 0.9 % IV SOLN
Freq: Once | INTRAVENOUS | Status: AC
  Administered 2013-12-12: 12:00:00 via INTRAVENOUS

## 2013-12-12 MED ORDER — ONDANSETRON 8 MG/NS 50 ML IVPB
INTRAVENOUS | Status: AC
  Filled 2013-12-12: qty 8

## 2013-12-12 NOTE — Patient Instructions (Signed)
Dehydration, Adult Dehydration is when you lose more fluids from the body than you take in. Vital organs like the kidneys, brain, and heart cannot function without a proper amount of fluids and salt. Any loss of fluids from the body can cause dehydration.  CAUSES   Vomiting.  Diarrhea.  Excessive sweating.  Excessive urine output.  Fever. SYMPTOMS  Mild dehydration  Thirst.  Dry lips.  Slightly dry mouth. Moderate dehydration  Very dry mouth.  Sunken eyes.  Skin does not bounce back quickly when lightly pinched and released.  Dark urine and decreased urine production.  Decreased tear production.  Headache. Severe dehydration  Very dry mouth.  Extreme thirst.  Rapid, weak pulse (more than 100 beats per minute at rest).  Cold hands and feet.  Not able to sweat in spite of heat and temperature.  Rapid breathing.  Blue lips.  Confusion and lethargy.  Difficulty being awakened.  Minimal urine production.  No tears. DIAGNOSIS  Your caregiver will diagnose dehydration based on your symptoms and your exam. Blood and urine tests will help confirm the diagnosis. The diagnostic evaluation should also identify the cause of dehydration. TREATMENT  Treatment of mild or moderate dehydration can often be done at home by increasing the amount of fluids that you drink. It is best to drink small amounts of fluid more often. Drinking too much at one time can make vomiting worse. Refer to the home care instructions below. Severe dehydration needs to be treated at the hospital where you will probably be given intravenous (IV) fluids that contain water and electrolytes. HOME CARE INSTRUCTIONS   Ask your caregiver about specific rehydration instructions.  Drink enough fluids to keep your urine clear or pale yellow.  Drink small amounts frequently if you have nausea and vomiting.  Eat as you normally do.  Avoid:  Foods or drinks high in sugar.  Carbonated  drinks.  Juice.  Extremely hot or cold fluids.  Drinks with caffeine.  Fatty, greasy foods.  Alcohol.  Tobacco.  Overeating.  Gelatin desserts.  Wash your hands well to avoid spreading bacteria and viruses.  Only take over-the-counter or prescription medicines for pain, discomfort, or fever as directed by your caregiver.  Ask your caregiver if you should continue all prescribed and over-the-counter medicines.  Keep all follow-up appointments with your caregiver. SEEK MEDICAL CARE IF:  You have abdominal pain and it increases or stays in one area (localizes).  You have a rash, stiff neck, or severe headache.  You are irritable, sleepy, or difficult to awaken.  You are weak, dizzy, or extremely thirsty. SEEK IMMEDIATE MEDICAL CARE IF:   You are unable to keep fluids down or you get worse despite treatment.  You have frequent episodes of vomiting or diarrhea.  You have blood or green matter (bile) in your vomit.  You have blood in your stool or your stool looks black and tarry.  You have not urinated in 6 to 8 hours, or you have only urinated a small amount of very dark urine.  You have a fever.  You faint. MAKE SURE YOU:   Understand these instructions.  Will watch your condition.  Will get help right away if you are not doing well or get worse. Document Released: 07/24/2005 Document Revised: 10/16/2011 Document Reviewed: 03/13/2011 Exit[employer] Patient Information 2014 [employer].  

## 2013-12-12 NOTE — Telephone Encounter (Signed)
[name]- called for pt stating that pt received her Gemzar 12/10/13. She has not been able to drink or eat since her treatment. She asked if we could give pt some IV fluids to help keep her from getting dehydrated. Per Dr. [name] give pt 500 ml of normal saline. Pt is aware of appointment today.

## 2013-12-12 NOTE — Progress Notes (Signed)
Reported to Dr. [name] that patient c/o constant nausea at home despite taking zofran 3x daily. Pt does not like to take compazine because it causes drowsiness. MD gave verbal order for IV zofran 8 mg NOW. This RN to follow up.

## 2013-12-12 NOTE — Telephone Encounter (Signed)
CALLED PT TO GIVE APPT 5/27 @. PT IS ALREADY AWARE.

## 2013-12-14 ENCOUNTER — Emergency Department (HOSPITAL_BASED_OUTPATIENT_CLINIC_OR_DEPARTMENT_OTHER)
Admission: EM | Admit: 2013-12-14 | Discharge: 2013-12-14 | Disposition: A | Discharge status: Home / Self Care | Payer: Medicare Other | Attending: Emergency Medicine | Admitting: Emergency Medicine

## 2013-12-14 ENCOUNTER — Encounter (HOSPITAL_BASED_OUTPATIENT_CLINIC_OR_DEPARTMENT_OTHER): Payer: Self-pay | Admitting: Emergency Medicine

## 2013-12-14 DIAGNOSIS — Z87891 Personal history of nicotine dependence: Secondary | ICD-10-CM | POA: Insufficient documentation

## 2013-12-14 DIAGNOSIS — I10 Essential (primary) hypertension: Secondary | ICD-10-CM | POA: Insufficient documentation

## 2013-12-14 DIAGNOSIS — Z79899 Other long term (current) drug therapy: Secondary | ICD-10-CM | POA: Insufficient documentation

## 2013-12-14 DIAGNOSIS — R5383 Other fatigue: Principal | ICD-10-CM

## 2013-12-14 DIAGNOSIS — Z853 Personal history of malignant neoplasm of breast: Secondary | ICD-10-CM | POA: Insufficient documentation

## 2013-12-14 DIAGNOSIS — R531 Weakness: Secondary | ICD-10-CM

## 2013-12-14 DIAGNOSIS — Z8509 Personal history of malignant neoplasm of other digestive organs: Secondary | ICD-10-CM | POA: Insufficient documentation

## 2013-12-14 DIAGNOSIS — Z8739 Personal history of other diseases of the musculoskeletal system and connective tissue: Secondary | ICD-10-CM | POA: Insufficient documentation

## 2013-12-14 DIAGNOSIS — R5381 Other malaise: Secondary | ICD-10-CM | POA: Insufficient documentation

## 2013-12-14 DIAGNOSIS — R110 Nausea: Secondary | ICD-10-CM | POA: Insufficient documentation

## 2013-12-14 DIAGNOSIS — E039 Hypothyroidism, unspecified: Secondary | ICD-10-CM | POA: Insufficient documentation

## 2013-12-14 DIAGNOSIS — K219 Gastro-esophageal reflux disease without esophagitis: Secondary | ICD-10-CM | POA: Insufficient documentation

## 2013-12-14 LAB — CBC WITH DIFFERENTIAL/PLATELET
BASOS PCT: 1 % (ref 0–1)
Band Neutrophils: 26 % — ABNORMAL HIGH (ref 0–10)
Basophils Absolute: 0 10*3/uL (ref 0.0–0.1)
EOS PCT: 0 % (ref 0–5)
Eosinophils Absolute: 0 10*3/uL (ref 0.0–0.7)
HCT: 34.3 % — ABNORMAL LOW (ref 36.0–46.0)
HEMOGLOBIN: 12 g/dL (ref 12.0–15.0)
LYMPHS ABS: 0.1 10*3/uL — AB (ref 0.7–4.0)
Lymphocytes Relative: 5 % — ABNORMAL LOW (ref 12–46)
MCH: 32.9 pg (ref 26.0–34.0)
MCHC: 35 g/dL (ref 30.0–36.0)
MCV: 94 fL (ref 78.0–100.0)
MONOS PCT: 6 % (ref 3–12)
Monocytes Absolute: 0.1 10*3/uL (ref 0.1–1.0)
Neutro Abs: 1.8 10*3/uL (ref 1.7–7.7)
Neutrophils Relative %: 62 % (ref 43–77)
Platelets: 147 10*3/uL — ABNORMAL LOW (ref 150–400)
RBC: 3.65 MIL/uL — AB (ref 3.87–5.11)
RDW: 13.6 % (ref 11.5–15.5)
WBC: 2 10*3/uL — AB (ref 4.0–10.5)

## 2013-12-14 LAB — COMPREHENSIVE METABOLIC PANEL
ALBUMIN: 2.4 g/dL — AB (ref 3.5–5.2)
ALT: 28 U/L (ref 0–35)
AST: 40 U/L — ABNORMAL HIGH (ref 0–37)
Alkaline Phosphatase: 163 U/L — ABNORMAL HIGH (ref 39–117)
BILIRUBIN TOTAL: 0.4 mg/dL (ref 0.3–1.2)
BUN: 9 mg/dL (ref 6–23)
CHLORIDE: 89 meq/L — AB (ref 96–112)
CO2: 28 meq/L (ref 19–32)
CREATININE: 0.6 mg/dL (ref 0.50–1.10)
Calcium: 8.5 mg/dL (ref 8.4–10.5)
GFR calc Af Amer: >90 mL/min (ref >90)
GFR, EST NON AFRICAN AMERICAN: 82 mL/min — AB (ref >90)
Glucose, Bld: 146 mg/dL — ABNORMAL HIGH (ref 70–99)
POTASSIUM: 4.2 meq/L (ref 3.7–5.3)
Sodium: 129 mEq/L — ABNORMAL LOW (ref 137–147)
Total Protein: 5.3 g/dL — ABNORMAL LOW (ref 6.0–8.3)

## 2013-12-14 LAB — URINALYSIS, ROUTINE W REFLEX MICROSCOPIC
Bilirubin Urine: NEGATIVE
GLUCOSE, UA: NEGATIVE mg/dL
HGB URINE DIPSTICK: NEGATIVE
KETONES UR: 40 mg/dL — AB
Leukocytes, UA: NEGATIVE
Nitrite: NEGATIVE
PROTEIN: NEGATIVE mg/dL
Specific Gravity, Urine: 1.016 (ref 1.005–1.030)
UROBILINOGEN UA: 1 mg/dL (ref 0.0–1.0)
pH: 7 (ref 5.0–8.0)

## 2013-12-14 MED ORDER — SODIUM CHLORIDE 0.9 % IV BOLUS (SEPSIS)
500.0000 mL | Freq: Once | INTRAVENOUS | Status: AC
  Administered 2013-12-14: 500 mL via INTRAVENOUS

## 2013-12-14 NOTE — ED Provider Notes (Signed)
CSN: [id number]     Arrival date & time 12/14/13  1357 History   First MD Initiated Contact with Patient 12/14/13 1427     Chief Complaint  Patient presents with  . Fatigue     (Consider location/radiation/quality/duration/timing/severity/associated sxs/prior Treatment) Patient is a 80 y.o. female presenting with weakness. The history is provided by the patient and a relative. No language interpreter was used.  Weakness Associated symptoms include fatigue, nausea and weakness. Pertinent negatives include no abdominal pain, chest pain, chills, diaphoresis, fever, headaches, neck pain or numbness. Associated symptoms comments: 80 year old with history of pancreatic and breast cancer presents today with 1 week of fatigue.  She has just finished 6 rounds of chemo and radiation treatment for the pancreatic cancer.  She has had nausea for the past 3 weeks, and 2 spells of vomiting in that time.  In the past week her prescribed zofran has not been able to control the nausea.  When she was treated with chemo and radiation 14 years ago for breast cancer she did not experience symptoms like this.  She has had very little appetite and states that she "can not eat or drink."  She was seen Friday at the cancer center and received IV zofran that helped significantly.  She feels very weak and has no energy around the house.  She denies any associated headaches, dizziness, numbness and tingling, fever, chest pain, sob, changes in bowel pattern, or urinary symptoms.   .    Past Medical History  Diagnosis Date  . Arthritis     Left knee, s/p cortisone injection  . GERD (gastroesophageal reflux disease)   . Complication of anesthesia     Very hard to wake up after anesthesia  . Dizziness   . Seasonal allergies   . Frequency of urination   . Dysrhythmia     Sinus tachycardia can get up to 108. Started after chemo   . Hypertension   . Hyperlipidemia   . Thyroid disease   . Hypothyroidism   . Breast cancer  2001    S/P mastectomy, XRT and chemo  . Cancer 10/07/13    pancreatic cancer/adenocarcinoma   Past Surgical History  Procedure Laterality Date  . Joint replacement      right knee  . Carpal tunnel release      bilateral  . Incontinence surgery    . Cataract extraction      left  . Mastectomy      right due to cancer  . Ercp  10/06/2011    Procedure: ENDOSCOPIC RETROGRADE CHOLANGIOPANCREATOGRAPHY (ERCP);  Surgeon: Gatha Mayer, MD;  Location: [facility];  Service: Endoscopy;  Laterality: N/A;  or general per anesthesia  . Sphincterotomy  10/06/2011    Procedure: SPHINCTEROTOMY;  Surgeon: Gatha Mayer, MD;  Location: [facility];  Service: Endoscopy;;  . Cholecystectomy  10/07/2011    Procedure: LAPAROSCOPIC CHOLECYSTECTOMY WITH INTRAOPERATIVE CHOLANGIOGRAM;  Surgeon: Edward Jolly, MD;  Location: [facility];  Service: General;  Laterality: N/A;  . Tonsillectomy    . Colonoscopy    . Eye surgery    . Lumbar laminectomy/decompression microdiscectomy  05/27/2012    Procedure: LUMBAR LAMINECTOMY/DECOMPRESSION MICRODISCECTOMY 1 LEVEL;  Surgeon: Ophelia Charter, MD;  Location: [facility];  Service: Neurosurgery;  Laterality: Bilateral;  Lumbar four laminectomy, with bilateral lumbar three laminotomies.  . Ercp N/A 10/04/2013    Procedure: ENDOSCOPIC RETROGRADE CHOLANGIOPANCREATOGRAPHY (ERCP);  Surgeon: Beryle Beams, MD;  Location: [facility];  Service:  Gastroenterology;  Laterality: N/A;  . Eus N/A 10/07/2013    Procedure: UPPER ENDOSCOPIC ULTRASOUND (EUS) LINEAR;  Surgeon: Beryle Beams, MD;  Location: [facility];  Service: Endoscopy;  Laterality: N/A;   Family History  Problem Relation Age of Onset  . Stroke Mother   . Breast cancer Mother     dx in her 64s  . Heart disease Mother   . Stroke Father   . Heart disease Father   . Heart attack Father   . Breast cancer Daughter 42  . Cerebral aneurysm Sister   . Hyperlipidemia Son   . Hypertension Son   . Prostate cancer  Maternal Grandfather   . Breast cancer Sister 32  . Breast cancer Other     niece dx in her 44s  . Lung cancer Other   . Cancer Other 20    great niece with cancer of the lining of the heart.   History  Substance Use Topics  . Smoking status: Former Smoker -- 0.25 packs/day for 5 years    Types: Cigarettes    Quit date: 10/01/1957  . Smokeless tobacco: Never Used     Comment: Quit 50 years ago.  . Alcohol Use: Yes     Comment: occasionally; socially; wine   OB History   Grav Para Term Preterm Abortions TAB SAB Ect Mult Living                 Review of Systems  Constitutional: Positive for activity change, appetite change and fatigue. Negative for fever, chills and diaphoresis.  Eyes: Negative for photophobia, pain and visual disturbance.  Respiratory: Negative for chest tightness and shortness of breath.   Cardiovascular: Negative for chest pain.  Gastrointestinal: Positive for nausea. Negative for abdominal pain, diarrhea, constipation and blood in stool.  Genitourinary: Negative for urgency and hematuria.  Musculoskeletal: Negative for neck pain.  Neurological: Positive for weakness. Negative for dizziness, syncope, light-headedness, numbness and headaches.      Allergies  Aspirin; Gemfibrozil; Nsaids; Pravastatin; and Pse hcl-dm hbr-guaifenesin tan  Home Medications   Prior to Admission medications   Medication Sig Start Date End Date Taking? Authorizing Provider  Blood Glucose Monitoring Suppl (BLOOD GLUCOSE MONITOR KIT) KIT by Does not apply route. relion brand    Historical Provider, MD  Cholecalciferol (VITAMIN D-3) 5000 UNITS TABS Take by mouth daily.     Historical Provider, MD  dexlansoprazole (DEXILANT) 60 MG capsule Take 60 mg by mouth daily.    Historical Provider, MD  estradiol (ESTRING) 2 MG vaginal ring Place 2 mg vaginally every 3 (three) months. follow package directions 10/27/13   Vicie Mutters, PA-C  Flaxseed, Linseed, (FLAXSEED OIL PO) Take 5 mLs by  mouth daily.     Historical Provider, MD  hyaluronate sodium (RADIAPLEXRX) GEL Apply 1 application topically 2 (two) times daily. Apply to  Affected skin  Area after rad tx and bedtime 11/05/13   Marye Round, MD  hydrochlorothiazide (HYDRODIURIL) 25 MG tablet Take 25 mg by mouth daily as needed (swelling).     Historical Provider, MD  levothyroxine (SYNTHROID, LEVOTHROID) 100 MCG tablet Take 50-100 mcg by mouth See admin instructions. Pt takes 1/2 tab for 50 mcg dose on Tuesday,thursday,saturday,sunday. 100 mcg  On Monday, Wednesday,friday    Historical Provider, MD  Magnesium 250 MG TABS Take 250 mg by mouth daily.     Historical Provider, MD  metFORMIN (GLUCOPHAGE-XR) 500 MG 24 hr tablet Take 1,000 mg by mouth 2 (two) times daily.  10/08/13 10/09/14  Unk Pinto, MD  metoprolol succinate (TOPROL-XL) 25 MG 24 hr tablet Take 25 mg by mouth daily.    Historical Provider, MD  Multiple Vitamin (MULTIVITAMIN) tablet Take 1 tablet by mouth daily.    Historical Provider, MD  Omega-3 Fatty Acids (FISH OIL) 1000 MG CAPS Take 1,000 mg by mouth daily.     Historical Provider, MD  ondansetron (ZOFRAN) 8 MG tablet TAKE ONE TABLET BY MOUTH TWICE DAILY AS NEEDED FOR NAUSEA AND VOMITING    Concha Norway, MD  prochlorperazine (COMPAZINE) 10 MG tablet Take 1 tablet (10 mg total) by mouth every 6 (six) hours as needed (Nausea or vomiting). 10/27/13   Concha Norway, MD  sucralfate (CARAFATE) 1 G tablet Take 1 tablet (1 g total) by mouth 2 (two) times daily. 08/14/13 08/14/14  Vicie Mutters, PA-C  TRUEPLUS LANCETS 33G MISC  10/05/13   Historical Provider, MD   BP 163/80  Pulse 101  Temp(Src) 98.2 F (36.8 C) (Oral)  Resp 16  Ht _0  (1.549 m)  Wt 108 lb (48.988 kg)  BMI 20.42 kg/m2  SpO2 96% Physical Exam  Vitals reviewed. Constitutional: She is oriented to person, place, and time. She appears well-developed.  Eyes: Pupils are equal, round, and reactive to light.  Cardiovascular: Normal rate, regular rhythm and  normal heart sounds.   No murmur heard. Pulmonary/Chest: Effort normal and breath sounds normal. No respiratory distress. She has no wheezes. She exhibits no tenderness.  Abdominal: Soft. There is no tenderness.  Neurological: She is alert and oriented to person, place, and time. No cranial nerve deficit.    ED Course  Procedures (including critical care time) Labs Review Labs Reviewed  CBC WITH DIFFERENTIAL  COMPREHENSIVE METABOLIC PANEL  URINALYSIS, ROUTINE W REFLEX MICROSCOPIC   Results for orders placed during the hospital encounter of 12/14/13  CBC WITH DIFFERENTIAL      Result Value Ref Range   WBC 2.0 (*) 4.0 - 10.5 K/uL   RBC 3.65 (*) 3.87 - 5.11 MIL/uL   Hemoglobin 12.0  12.0 - 15.0 g/dL   HCT 34.3 (*) 36.0 - 46.0 %   MCV 94.0  78.0 - 100.0 fL   MCH 32.9  26.0 - 34.0 pg   MCHC 35.0  30.0 - 36.0 g/dL   RDW 13.6  11.5 - 15.5 %   Platelets 147 (*) 150 - 400 K/uL   Neutrophils Relative % 62  43 - 77 %   Lymphocytes Relative 5 (*) 12 - 46 %   Monocytes Relative 6  3 - 12 %   Eosinophils Relative 0  0 - 5 %   Basophils Relative 1  0 - 1 %   Band Neutrophils 26 (*) 0 - 10 %   Neutro Abs 1.8  1.7 - 7.7 K/uL   Lymphs Abs 0.1 (*) 0.7 - 4.0 K/uL   Monocytes Absolute 0.1  0.1 - 1.0 K/uL   Eosinophils Absolute 0.0  0.0 - 0.7 K/uL   Basophils Absolute 0.0  0.0 - 0.1 K/uL   RBC Morphology MIXED RBC POPULATION    COMPREHENSIVE METABOLIC PANEL      Result Value Ref Range   Sodium 129 (*) 137 - 147 mEq/L   Potassium 4.2  3.7 - 5.3 mEq/L   Chloride 89 (*) 96 - 112 mEq/L   CO2 28  19 - 32 mEq/L   Glucose, Bld 146 (*) 70 - 99 mg/dL   BUN 9  6 - 23 mg/dL  Creatinine, Ser 0.60  0.50 - 1.10 mg/dL   Calcium 8.5  8.4 - 10.5 mg/dL   Total Protein 5.3 (*) 6.0 - 8.3 g/dL   Albumin 2.4 (*) 3.5 - 5.2 g/dL   AST 40 (*) 0 - 37 U/L   ALT 28  0 - 35 U/L   Alkaline Phosphatase 163 (*) 39 - 117 U/L   Total Bilirubin 0.4  0.3 - 1.2 mg/dL   GFR calc non Af Amer 82 (*) >90 mL/min   GFR calc  Af Amer >90  >90 mL/min  URINALYSIS, ROUTINE W REFLEX MICROSCOPIC      Result Value Ref Range   Color, Urine YELLOW  YELLOW   APPearance CLEAR  CLEAR   Specific Gravity, Urine 1.016  1.005 - 1.030   pH 7.0  5.0 - 8.0   Glucose, UA NEGATIVE  NEGATIVE mg/dL   Hgb urine dipstick NEGATIVE  NEGATIVE   Bilirubin Urine NEGATIVE  NEGATIVE   Ketones, ur 40 (*) NEGATIVE mg/dL   Protein, ur NEGATIVE  NEGATIVE mg/dL   Urobilinogen, UA 1.0  0.0 - 1.0 mg/dL   Nitrite NEGATIVE  NEGATIVE   Leukocytes, UA NEGATIVE  NEGATIVE    Imaging Review No results found.   EKG Interpretation None      MDM   Final diagnoses:  None    1. Weakness  She feels some better with IV fluids. Lab studies unremarkable for acute changes. She ambulates with assistance, same as at home. She feels she is comfortable and prefers discharge home vs hospital admission.    Dewaine Oats, PA-C 12/14/13 1912

## 2013-12-14 NOTE — Discharge Instructions (Signed)
FOLLOW UP WITH YOUR DOCTOR TOMORROW FOR RECHECK. RETURN HERE WITH ANY WORSENING SYMPTOMS OR NEW CONCERNS.

## 2013-12-14 NOTE — ED Notes (Signed)
Finished Chemo and Radiation tx this past week and has been very weak for the past 7 days.  Came today at the suggestion of Oncology for blood tests.

## 2013-12-15 NOTE — ED Provider Notes (Signed)
Medical screening examination/treatment/procedure(s) were conducted as a shared visit with non-physician practitioner(s) and myself.  I personally evaluated the patient during the encounter.   EKG Interpretation None      I interviewed and examined the patient. Lungs are CTAB. Cardiac exam wnl. Abdomen soft.  Pt w hx of pancreatic cancer here w/ generalized weakness, fatigue, nausea. Plan on IVF rehydration and screening labwork.   Procedure note: Ultrasound Guided Peripheral IV Ultrasound guided 20 g peripheral 1.88 inch angiocath IV placement performed by me. Indications: Nursing unable to place IV. Details: The left antecubital fossa and upper arm was evaluated with a multifrequency linear probe. Several patent brachial veins are noted. 2 attempts were made to cannulate a left basilic vein under realtime US guidance with successful cannulation of the vein and catheter placement. There is return of non-pulsatile dark red blood. The patient tolerated the procedure well without complications.     Blanchard Kelch, MD 12/15/13 1944

## 2013-12-16 ENCOUNTER — Other Ambulatory Visit: Payer: Self-pay | Admitting: *Deleted

## 2013-12-16 ENCOUNTER — Other Ambulatory Visit: Payer: Self-pay | Admitting: Medical Oncology

## 2013-12-16 ENCOUNTER — Other Ambulatory Visit: Payer: Self-pay | Admitting: Internal Medicine

## 2013-12-16 ENCOUNTER — Telehealth: Payer: Self-pay | Admitting: *Deleted

## 2013-12-16 MED ORDER — ONDANSETRON HCL 8 MG PO TABS: 8.0000 mg | ORAL_TABLET | Freq: Two times a day (BID) | ORAL | Status: DC

## 2013-12-16 MED ORDER — METOPROLOL SUCCINATE ER 100 MG PO TB24: 100.0000 mg | ORAL_TABLET | Freq: Every day | ORAL | Status: DC

## 2013-12-16 NOTE — Telephone Encounter (Signed)
REFILL= METROPROLOL 100MG   NEEDS CALLED TO [facility]

## 2013-12-16 NOTE — Progress Notes (Signed)
  Radiation Oncology         [phone]) [phone] ________________________________  Name: [name] MRN: [id number]  Date: 12/10/2013  DOB: September 04, 1927  End of Treatment Note  Diagnosis:   Pancreatic cancer     Indication for treatment:  Curative       Radiation treatment dates:   11/03/2013 through 12/10/2013  Site/dose:   The patient was treated to the pancreatic region within the abdomen to a dose of 50.4 gray in 28 fractions at 1.8 gray per fraction. The patient's treatment consisted of IMRT with daily image guidance.  Narrative: The patient tolerated radiation treatment relatively well.   The patient experienced some GI issues, most prominently decreased appetite and nausea towards the end of treatment. Also experienced fatigue, especially at the end of her course of radiation.  Plan: The patient has completed radiation treatment. The patient will return to radiation oncology clinic for routine followup in one month. I advised the patient to call or return sooner if they have any questions or concerns related to their recovery or treatment. ________________________________  Jodelle Gross, M.D., Ph.D.

## 2013-12-17 ENCOUNTER — Other Ambulatory Visit: Payer: Self-pay | Admitting: *Deleted

## 2013-12-17 ENCOUNTER — Telehealth: Payer: Self-pay | Admitting: Internal Medicine

## 2013-12-17 DIAGNOSIS — R2681 Unsteadiness on feet: Secondary | ICD-10-CM

## 2013-12-17 DIAGNOSIS — R531 Weakness: Secondary | ICD-10-CM

## 2013-12-17 DIAGNOSIS — C259 Malignant neoplasm of pancreas, unspecified: Secondary | ICD-10-CM

## 2013-12-17 DIAGNOSIS — Z0189 Encounter for other specified special examinations: Secondary | ICD-10-CM

## 2013-12-17 NOTE — Telephone Encounter (Signed)
Is this the type of bed ordered ?

## 2013-12-17 NOTE — Telephone Encounter (Signed)
[facility] REQUESTING [facility] FOR 30 DEGREE HEAD RAISE BED/  -OTHER ORDERS -[facility]

## 2013-12-18 NOTE — Telephone Encounter (Signed)
Yes.  The options were 30 degrees or another choice, I choose the 30 degree based on Melissa at [facility] message received yesterday.

## 2013-12-19 ENCOUNTER — Telehealth: Payer: Self-pay | Admitting: Internal Medicine

## 2013-12-19 DIAGNOSIS — R5381 Other malaise: Secondary | ICD-10-CM

## 2013-12-19 DIAGNOSIS — K8070 Calculus of gallbladder and bile duct without cholecystitis without obstruction: Secondary | ICD-10-CM

## 2013-12-19 DIAGNOSIS — R630 Anorexia: Secondary | ICD-10-CM

## 2013-12-19 DIAGNOSIS — R5383 Other fatigue: Secondary | ICD-10-CM

## 2013-12-19 NOTE — Telephone Encounter (Signed)
Gave pt appt for contrast for CT

## 2013-12-23 ENCOUNTER — Other Ambulatory Visit: Payer: Self-pay

## 2013-12-23 ENCOUNTER — Telehealth: Payer: Self-pay

## 2013-12-23 DIAGNOSIS — C250 Malignant neoplasm of head of pancreas: Secondary | ICD-10-CM

## 2013-12-23 MED ORDER — ONDANSETRON HCL 8 MG PO TABS: 8.0000 mg | ORAL_TABLET | Freq: Three times a day (TID) | ORAL | Status: DC | PRN

## 2013-12-23 NOTE — Telephone Encounter (Signed)
Family called stating pt very nauseated and using zofran TID, original rx states BID so she cannot refil. S/w Dr [name] and e-scribed Rx with increased dose. Family aware of new Rx.

## 2013-12-23 NOTE — Telephone Encounter (Signed)
Returning earlier call. Family and pt concerned about getting contrast drunk for CT. Suggested premedication with zofran or compazine, suggested refrigerating contrast, suggested drinking a glass at a time and a mind set of this is one time medicine for the test. [facility] said they would try their best.

## 2013-12-24 ENCOUNTER — Encounter (HOSPITAL_COMMUNITY): Payer: Self-pay

## 2013-12-24 ENCOUNTER — Other Ambulatory Visit: Payer: Self-pay | Admitting: *Deleted

## 2013-12-24 ENCOUNTER — Ambulatory Visit (HOSPITAL_COMMUNITY)
Admission: RE | Admit: 2013-12-24 | Discharge: 2013-12-24 | Disposition: A | Discharge status: Home / Self Care | Payer: Medicare Other | Source: Ambulatory Visit | Attending: Physician Assistant | Admitting: Physician Assistant

## 2013-12-24 DIAGNOSIS — K7689 Other specified diseases of liver: Secondary | ICD-10-CM | POA: Insufficient documentation

## 2013-12-24 DIAGNOSIS — C250 Malignant neoplasm of head of pancreas: Secondary | ICD-10-CM

## 2013-12-24 HISTORY — DX: Type 2 diabetes mellitus without complications: E11.9

## 2013-12-24 MED ORDER — ONDANSETRON HCL 8 MG PO TABS: ORAL_TABLET | ORAL | Status: DC

## 2013-12-24 MED ORDER — IOHEXOL 300 MG/ML  SOLN
80.0000 mL | Freq: Once | INTRAMUSCULAR | Status: AC | PRN
  Administered 2013-12-24: 80 mL via INTRAVENOUS

## 2013-12-31 ENCOUNTER — Encounter: Payer: Self-pay | Admitting: Internal Medicine

## 2013-12-31 ENCOUNTER — Other Ambulatory Visit: Payer: Medicare Other

## 2013-12-31 ENCOUNTER — Ambulatory Visit: Payer: Medicare Other

## 2013-12-31 ENCOUNTER — Ambulatory Visit (INDEPENDENT_AMBULATORY_CARE_PROVIDER_SITE_OTHER): Payer: Medicare Other | Admitting: Internal Medicine

## 2013-12-31 ENCOUNTER — Inpatient Hospital Stay (HOSPITAL_COMMUNITY)
Admission: EM | Admit: 2013-12-31 | Discharge: 2014-01-05 | DRG: 640 | Disposition: A | Discharge status: Hospice | Payer: Medicare Other | Attending: Internal Medicine | Admitting: Internal Medicine

## 2013-12-31 ENCOUNTER — Encounter (HOSPITAL_COMMUNITY): Payer: Self-pay | Admitting: Emergency Medicine

## 2013-12-31 ENCOUNTER — Telehealth: Payer: Self-pay | Admitting: Internal Medicine

## 2013-12-31 VITALS — BP 82/54 | HR 88 | Temp 97.7°F | Resp 16 | Ht 61.0 in | Wt 98.4 lb

## 2013-12-31 DIAGNOSIS — K219 Gastro-esophageal reflux disease without esophagitis: Secondary | ICD-10-CM

## 2013-12-31 DIAGNOSIS — Z79899 Other long term (current) drug therapy: Secondary | ICD-10-CM

## 2013-12-31 DIAGNOSIS — Z9849 Cataract extraction status, unspecified eye: Secondary | ICD-10-CM

## 2013-12-31 DIAGNOSIS — Z9010 Acquired absence of unspecified breast and nipple: Secondary | ICD-10-CM

## 2013-12-31 DIAGNOSIS — R7401 Elevation of levels of liver transaminase levels: Secondary | ICD-10-CM

## 2013-12-31 DIAGNOSIS — Z8509 Personal history of malignant neoplasm of other digestive organs: Secondary | ICD-10-CM

## 2013-12-31 DIAGNOSIS — R630 Anorexia: Secondary | ICD-10-CM

## 2013-12-31 DIAGNOSIS — E876 Hypokalemia: Secondary | ICD-10-CM | POA: Diagnosis present

## 2013-12-31 DIAGNOSIS — Z801 Family history of malignant neoplasm of trachea, bronchus and lung: Secondary | ICD-10-CM

## 2013-12-31 DIAGNOSIS — I10 Essential (primary) hypertension: Secondary | ICD-10-CM

## 2013-12-31 DIAGNOSIS — E119 Type 2 diabetes mellitus without complications: Secondary | ICD-10-CM

## 2013-12-31 DIAGNOSIS — R740 Nonspecific elevation of levels of transaminase and lactic acid dehydrogenase [LDH]: Secondary | ICD-10-CM

## 2013-12-31 DIAGNOSIS — R531 Weakness: Secondary | ICD-10-CM

## 2013-12-31 DIAGNOSIS — Z515 Encounter for palliative care: Secondary | ICD-10-CM

## 2013-12-31 DIAGNOSIS — E860 Dehydration: Secondary | ICD-10-CM

## 2013-12-31 DIAGNOSIS — D500 Iron deficiency anemia secondary to blood loss (chronic): Secondary | ICD-10-CM | POA: Diagnosis present

## 2013-12-31 DIAGNOSIS — Z87891 Personal history of nicotine dependence: Secondary | ICD-10-CM

## 2013-12-31 DIAGNOSIS — E039 Hypothyroidism, unspecified: Secondary | ICD-10-CM

## 2013-12-31 DIAGNOSIS — E871 Hypo-osmolality and hyponatremia: Principal | ICD-10-CM | POA: Diagnosis present

## 2013-12-31 DIAGNOSIS — C259 Malignant neoplasm of pancreas, unspecified: Secondary | ICD-10-CM

## 2013-12-31 DIAGNOSIS — Z66 Do not resuscitate: Secondary | ICD-10-CM

## 2013-12-31 DIAGNOSIS — R627 Adult failure to thrive: Secondary | ICD-10-CM | POA: Diagnosis present

## 2013-12-31 DIAGNOSIS — I959 Hypotension, unspecified: Secondary | ICD-10-CM

## 2013-12-31 DIAGNOSIS — Z853 Personal history of malignant neoplasm of breast: Secondary | ICD-10-CM

## 2013-12-31 DIAGNOSIS — K8070 Calculus of gallbladder and bile duct without cholecystitis without obstruction: Secondary | ICD-10-CM

## 2013-12-31 DIAGNOSIS — R1310 Dysphagia, unspecified: Secondary | ICD-10-CM | POA: Diagnosis present

## 2013-12-31 DIAGNOSIS — E785 Hyperlipidemia, unspecified: Secondary | ICD-10-CM

## 2013-12-31 DIAGNOSIS — K7689 Other specified diseases of liver: Secondary | ICD-10-CM | POA: Diagnosis present

## 2013-12-31 DIAGNOSIS — Z681 Body mass index (BMI) 19 or less, adult: Secondary | ICD-10-CM

## 2013-12-31 DIAGNOSIS — C50919 Malignant neoplasm of unspecified site of unspecified female breast: Secondary | ICD-10-CM

## 2013-12-31 DIAGNOSIS — B370 Candidal stomatitis: Secondary | ICD-10-CM | POA: Diagnosis present

## 2013-12-31 DIAGNOSIS — E43 Unspecified severe protein-calorie malnutrition: Secondary | ICD-10-CM

## 2013-12-31 DIAGNOSIS — D62 Acute posthemorrhagic anemia: Secondary | ICD-10-CM | POA: Diagnosis present

## 2013-12-31 DIAGNOSIS — Z923 Personal history of irradiation: Secondary | ICD-10-CM

## 2013-12-31 DIAGNOSIS — Z803 Family history of malignant neoplasm of breast: Secondary | ICD-10-CM

## 2013-12-31 LAB — URINALYSIS, ROUTINE W REFLEX MICROSCOPIC
Bilirubin Urine: NEGATIVE
Bilirubin Urine: NEGATIVE
GLUCOSE, UA: NEGATIVE mg/dL
Glucose, UA: NEGATIVE mg/dL
HGB URINE DIPSTICK: NEGATIVE
Hgb urine dipstick: NEGATIVE
Ketones, ur: NEGATIVE mg/dL
Ketones, ur: NEGATIVE mg/dL
Leukocytes, UA: NEGATIVE
Leukocytes, UA: NEGATIVE
Nitrite: NEGATIVE
Nitrite: NEGATIVE
PROTEIN: NEGATIVE mg/dL
Protein, ur: NEGATIVE mg/dL
Specific Gravity, Urine: 1.009 (ref 1.005–1.030)
Specific Gravity, Urine: 1.011 (ref 1.005–1.030)
Urobilinogen, UA: 1 mg/dL (ref 0.0–1.0)
Urobilinogen, UA: 1 mg/dL (ref 0.0–1.0)
pH: 7 (ref 5.0–8.0)
pH: 7.5 (ref 5.0–8.0)

## 2013-12-31 LAB — COMPREHENSIVE METABOLIC PANEL
ALT: 37 U/L — ABNORMAL HIGH (ref 0–35)
AST: 35 U/L (ref 0–37)
Albumin: 2.1 g/dL — ABNORMAL LOW (ref 3.5–5.2)
Alkaline Phosphatase: 371 U/L — ABNORMAL HIGH (ref 39–117)
BUN: 10 mg/dL (ref 6–23)
CO2: 30 mEq/L (ref 19–32)
Calcium: 8.2 mg/dL — ABNORMAL LOW (ref 8.4–10.5)
Chloride: 91 mEq/L — ABNORMAL LOW (ref 96–112)
Creatinine, Ser: 0.5 mg/dL (ref 0.50–1.10)
GFR calc Af Amer: >90 mL/min (ref >90)
GFR calc non Af Amer: 87 mL/min — ABNORMAL LOW (ref >90)
Glucose, Bld: 171 mg/dL — ABNORMAL HIGH (ref 70–99)
Potassium: 3.4 mEq/L — ABNORMAL LOW (ref 3.7–5.3)
Sodium: 132 mEq/L — ABNORMAL LOW (ref 137–147)
Total Bilirubin: 0.4 mg/dL (ref 0.3–1.2)
Total Protein: 4.9 g/dL — ABNORMAL LOW (ref 6.0–8.3)

## 2013-12-31 LAB — CBC WITH DIFFERENTIAL/PLATELET
Basophils Absolute: 0.1 10*3/uL (ref 0.0–0.1)
Basophils Relative: 1 % (ref 0–1)
Eosinophils Absolute: 0 10*3/uL (ref 0.0–0.7)
Eosinophils Relative: 0 % (ref 0–5)
HCT: 35.3 % — ABNORMAL LOW (ref 36.0–46.0)
Hemoglobin: 12.1 g/dL (ref 12.0–15.0)
Lymphocytes Relative: 27 % (ref 12–46)
Lymphs Abs: 1.9 10*3/uL (ref 0.7–4.0)
MCH: 31.3 pg (ref 26.0–34.0)
MCHC: 34.3 g/dL (ref 30.0–36.0)
MCV: 91.5 fL (ref 78.0–100.0)
Monocytes Absolute: 0.9 10*3/uL (ref 0.1–1.0)
Monocytes Relative: 13 % — ABNORMAL HIGH (ref 3–12)
Neutro Abs: 4.1 10*3/uL (ref 1.7–7.7)
Neutrophils Relative %: 59 % (ref 43–77)
Platelets: 174 10*3/uL (ref 150–400)
RBC: 3.86 MIL/uL — ABNORMAL LOW (ref 3.87–5.11)
RDW: 15.7 % — ABNORMAL HIGH (ref 11.5–15.5)
WBC: 7 10*3/uL (ref 4.0–10.5)

## 2013-12-31 LAB — MAGNESIUM: Magnesium: 1.7 mg/dL (ref 1.5–2.5)

## 2013-12-31 MED ORDER — FLUCONAZOLE 100MG IVPB
100.0000 mg | INTRAVENOUS | Status: DC
  Administered 2013-12-31 – 2014-01-04 (×5): 100 mg via INTRAVENOUS
  Filled 2013-12-31 (×6): qty 50

## 2013-12-31 MED ORDER — PANTOPRAZOLE SODIUM 40 MG PO TBEC
40.0000 mg | DELAYED_RELEASE_TABLET | Freq: Every day | ORAL | Status: DC
  Administered 2014-01-01 – 2014-01-05 (×5): 40 mg via ORAL
  Filled 2013-12-31 (×7): qty 1

## 2013-12-31 MED ORDER — HYDROCODONE-ACETAMINOPHEN 5-325 MG PO TABS
1.0000 | ORAL_TABLET | Freq: Four times a day (QID) | ORAL | Status: DC | PRN
  Administered 2014-01-04: 1 via ORAL
  Filled 2013-12-31: qty 1

## 2013-12-31 MED ORDER — LORAZEPAM 2 MG/ML IJ SOLN
1.0000 mg | Freq: Two times a day (BID) | INTRAMUSCULAR | Status: DC | PRN
  Filled 2013-12-31: qty 1

## 2013-12-31 MED ORDER — LEVOTHYROXINE SODIUM 50 MCG PO TABS
50.0000 ug | ORAL_TABLET | ORAL | Status: DC
  Administered 2014-01-01 – 2014-01-04 (×3): 50 ug via ORAL
  Filled 2013-12-31 (×4): qty 1

## 2013-12-31 MED ORDER — SODIUM CHLORIDE 0.9 % IV SOLN
INTRAVENOUS | Status: DC
  Administered 2013-12-31 – 2014-01-03 (×2): via INTRAVENOUS

## 2013-12-31 MED ORDER — RADIAPLEXRX EX GEL
1.0000 "application " | Freq: Two times a day (BID) | CUTANEOUS | Status: DC
  Administered 2014-01-03: 1 via TOPICAL
  Filled 2013-12-31: qty 170

## 2013-12-31 MED ORDER — ENOXAPARIN SODIUM 30 MG/0.3ML ~~LOC~~ SOLN
30.0000 mg | SUBCUTANEOUS | Status: DC
  Administered 2013-12-31 – 2014-01-04 (×5): 30 mg via SUBCUTANEOUS
  Filled 2013-12-31 (×6): qty 0.3

## 2013-12-31 MED ORDER — LEVOTHYROXINE SODIUM 50 MCG PO TABS: 50.0000 ug | ORAL_TABLET | ORAL | Status: DC

## 2013-12-31 MED ORDER — HYDROMORPHONE HCL PF 1 MG/ML IJ SOLN: 1.0000 mg | INTRAMUSCULAR | Status: DC | PRN

## 2013-12-31 MED ORDER — SODIUM CHLORIDE 0.9 % IV BOLUS (SEPSIS)
1000.0000 mL | Freq: Once | INTRAVENOUS | Status: AC
  Administered 2013-12-31: 1000 mL via INTRAVENOUS

## 2013-12-31 MED ORDER — ONDANSETRON HCL 4 MG PO TABS: 4.0000 mg | ORAL_TABLET | Freq: Four times a day (QID) | ORAL | Status: DC | PRN

## 2013-12-31 MED ORDER — LEVOTHYROXINE SODIUM 100 MCG PO TABS
100.0000 ug | ORAL_TABLET | ORAL | Status: DC
  Administered 2014-01-02 – 2014-01-05 (×2): 100 ug via ORAL
  Filled 2013-12-31 (×2): qty 1

## 2013-12-31 MED ORDER — POTASSIUM CHLORIDE 20 MEQ/15ML (10%) PO LIQD
40.0000 meq | Freq: Once | ORAL | Status: AC
  Administered 2013-12-31: 40 meq via ORAL
  Filled 2013-12-31: qty 30

## 2013-12-31 MED ORDER — DRONABINOL 5 MG PO CAPS
5.0000 mg | ORAL_CAPSULE | Freq: Two times a day (BID) | ORAL | Status: DC
  Administered 2014-01-01 – 2014-01-03 (×5): 5 mg via ORAL
  Filled 2013-12-31 (×5): qty 1

## 2013-12-31 MED ORDER — SUCRALFATE 1 G PO TABS
1.0000 g | ORAL_TABLET | Freq: Two times a day (BID) | ORAL | Status: DC
  Administered 2013-12-31 – 2014-01-05 (×10): 1 g via ORAL
  Filled 2013-12-31 (×13): qty 1

## 2013-12-31 MED ORDER — SODIUM CHLORIDE 0.9 % IV SOLN: INTRAVENOUS | Status: DC

## 2013-12-31 MED ORDER — ONDANSETRON HCL 4 MG/2ML IJ SOLN: 4.0000 mg | Freq: Four times a day (QID) | INTRAMUSCULAR | Status: DC | PRN

## 2013-12-31 MED ORDER — POTASSIUM CHLORIDE 10 MEQ/100ML IV SOLN
10.0000 meq | INTRAVENOUS | Status: DC
  Filled 2013-12-31 (×2): qty 100

## 2013-12-31 NOTE — Progress Notes (Signed)
    CARE MANAGEMENT ED NOTE 12/31/2013  Patient:  [name], [name]   Account Number:  [id number]  Date Initiated:  12/31/2013  Documentation initiated by:  Jackelyn Poling  Subjective/Objective Assessment:   80 yr old medicare covered [place] pancreatic cancer & last chemo treatment was about 2-3 weeks ago. Ever since pt hasnt been eating or drinking like normal. Referred by her PCP. lost about 22 lbs     Subjective/Objective Assessment Detail:   pcp [place], [place]  followed by [facility]     Action/Plan:   CM consult noted Left Kristen of [facility] a voice message informing her of pt admission   Action/Plan Detail:   Anticipated DC Date:       Status Recommendation to Physician:   Result of Recommendation:    Other ED Services  Consult [facility]  Other   [place]   Choice offered to / List presented to:           [place].    Status of service:  Completed, signed off  ED Comments:   ED Comments Detail:

## 2013-12-31 NOTE — Telephone Encounter (Signed)
, °

## 2013-12-31 NOTE — Progress Notes (Signed)
Subjective:    Patient ID: [name], female    DOB: [birth year], 80 y.o.   MRN: [id number]  HPI This very nice but unfortunate 80 yo WWF  recently started ChemoTx for Pancreatic Ca presented today appearing dehydrated and anemic with a BP of 82/54 and mentally dulled was briefly evaluated and sent to the ER  for evaluation for admission. Family relates her oral intake has markedly diminished over the last several days.   Medication List       BLOOD GLUCOSE MONITOR KIT Kit  by Does not apply route. relion brand     cetirizine 10 MG tablet  Commonly known as:  ZYRTEC  Take 10 mg by mouth daily.     DEXILANT 60 MG capsule  Generic drug:  dexlansoprazole  Take 60 mg by mouth daily.     estradiol 2 MG vaginal ring  Commonly known as:  ESTRING  Place 2 mg vaginally every 3 (three) months. follow package directions     Fish Oil 1000 MG Caps  Take 3,000 mg by mouth daily.     FLAXSEED OIL PO  Take 5 mLs by mouth daily.     hyaluronate sodium Gel  Apply 1 application topically 2 (two) times daily. Apply to  Affected skin  Area after rad tx and bedtime     hydrochlorothiazide 25 MG tablet  Commonly known as:  HYDRODIURIL  Take 12.5 mg by mouth daily as needed (swelling).     HYDROcodone-acetaminophen 5-325 MG per tablet  Commonly known as:  NORCO/VICODIN  Take 1 tablet by mouth every 6 (six) hours as needed for moderate pain. Takes 1/4 to 1 tab every 4-6 hours PRN pain     levothyroxine 100 MCG tablet  Commonly known as:  SYNTHROID, LEVOTHROID  Take 50-100 mcg by mouth See admin instructions. Pt takes 1/2 tab for 50 mcg dose on Tuesday,thursday,saturday,sunday. 100 mcg  On Monday, Wednesday,friday     Magnesium 250 MG Tabs  Take 250 mg by mouth daily.     metFORMIN 500 MG 24 hr tablet  Commonly known as:  GLUCOPHAGE-XR  Take 1,000 mg by mouth 2 (two) times daily.     metoprolol succinate 100 MG 24 hr tablet  Commonly known as:  TOPROL-XL  Take 1 tablet (100 mg  total) by mouth daily.     multivitamin tablet  Take 1 tablet by mouth daily.     ondansetron 8 MG tablet  Commonly known as:  ZOFRAN  Take 1-2 tabs TID for nausea     ondansetron 8 MG tablet  Commonly known as:  ZOFRAN  Take 8-16 mg by mouth every 8 (eight) hours as needed for nausea. Take 1-2 tabs TID for nausea     prochlorperazine 10 MG tablet  Commonly known as:  COMPAZINE  Take 1 tablet (10 mg total) by mouth every 6 (six) hours as needed (Nausea or vomiting).     sucralfate 1 G tablet  Commonly known as:  CARAFATE  Take 1 tablet (1 g total) by mouth 2 (two) times daily.     TRUEPLUS LANCETS 33G Misc     Vitamin D-3 5000 UNITS Tabs  Take by mouth daily.     Allergies  Allergen Reactions  . Aspirin     GI upset  . Gemfibrozil   . Nsaids Other (See Comments)    "irritates my stomach"  . Pravastatin     Muscle aches  . Pse Hcl-Dm Hbr-Guaifenesin Tan  Palpitations   Past Medical History  Diagnosis Date  . Arthritis     Left knee, s/p cortisone injection  . GERD (gastroesophageal reflux disease)   . Complication of anesthesia     Very hard to wake up after anesthesia  . Dizziness   . Seasonal allergies   . Frequency of urination   . Dysrhythmia     Sinus tachycardia can get up to 108. Started after chemo   . Hypertension   . Hyperlipidemia   . Thyroid disease   . Hypothyroidism   . Breast cancer 2001    S/P mastectomy, XRT and chemo  . Cancer 10/07/13    pancreatic cancer/adenocarcinoma  . Diabetes mellitus without complication     metformin   Review of Systems Systems Review pertinent as above and otherwise negative    Objective:   Physical Exam  BP 82/54 rechecked  96/53    Pulse 88  T 97.7 F   R 16  Ht $R'5\' 1"'Xn$    Wt 98 lb  BMI 18.60 kg/m2 Patient in wheelchair appearing very sallow and pale. Skin tents w/ decreased turgor. HEENT - Eac's patent. TM's Nl.EOM's full. PERRLA. NasoOroPharynx clear & dry Neck - supple.  No bruits nodes  JVD Chest - Clear decreased equal BS Cor - Nl HS. RRR w/o sig MGR. No edema. Abd - Soft. No palpable organomegaly, masses or tenderness. BS nl. MS- FROM. w/o deformities. Muscle power tone and bulk decreased.  Neuro - No obvious Cr N abnormalities. Sensory, motor and Cerebellar functions appear Nl w/o focal abnormalities. Mentation dulled . Unable to complete a sentence.    Assessment & Plan:   1. Dehydration  2. Pancreatic cancer   Refer to ER. Discussed pre terminal status with Patient's family.

## 2013-12-31 NOTE — ED Notes (Signed)
Per Dr Wilson Singer - let fluids run for 30 min.  RN Catalina Antigua will notify me when fluids have been shut off.  They know fluids have to be off for 30 min before i can stick.

## 2013-12-31 NOTE — Progress Notes (Signed)
12/31/2013 [name] 1648pm EDCM spoke to patient, patient's daughter in law Melissa and patient's daughter at bedside.  Patient's daughter in law Lenna Sciara [phone].  Patient  lives at home alone.  Patient has a hospital bed, bedside commode, rolling walker, and four prong can at home.  Patient's daughter in law confirms patient is active with [facility] with RN, PT, OT and Education officer, museum.  aslo confirms patient's pcp is Dr. Melford Aase.  Patient has a Charity fundraiser who stays with her from [place] a family friend stays with patient until patient's children finish work.  Patient's daughter and son stay with her night.  EDCM provided patient's daughter with list of home health agencies in [place] highlighting [place].  EDCM also provided patient's family with printed information regarding [place].  Patient's family agreeable for [facility] to make a referral to [facility].  Patient's daughter in law also requesting a consult be placed to social worker for possible SNF placement due to safety issues.  EDCM has placed consult into social work.  Patient's family thankful for services.  No further EDCM needs at this time.

## 2013-12-31 NOTE — ED Notes (Signed)
IV fluids restarted.

## 2013-12-31 NOTE — H&P (Signed)
[facility] History and Physical  [name] ZRA:[id number] DOB: [birth year] DOA: 12/31/2013  Referring physician: ED physician PCP: [name], MD   Chief Complaint: abdominal pain and FTT   HPI:  Pt is 80 yo female with pancreatic cancer, follows with Dr. [name], presented to [facility] ED with main concern of progressive failure to thrive, poor oral intake, generalized weakness. Pt explains she underwent chemotherapy few weeks ago and was getting progressively weaker and unable to eat much. She also reports intermittent upper quadrants abdominal pain, throbbing, 5/10 in severity, non radiating, no specific alleviating or aggravating factors. She denies fevers, chills, no chest pain or shortness of breath, no specific focal neurological symptoms.  In ED, pt is hemodynamically stable, physical exam notable for clinical dehydration. TRH asked to admit for further evaluation.   Assessment and Plan: Active Problems: Generalized weakness with FTT - most likely secondary to progressive nature of pancreatic cancer - Dr. [name] in the room with [place] and noted decreasing size of pancreatic cancer but possible spread to the liver  - will admit pt to medical floor - provide IVF, analgesia, antiemetics as needed - will need nutrition consultation, PT/OT once able to participate  - provide appetite stimulant  Dysphagia - oral thrush noted - will place on Fluconazole IV QD Pancreatic cancer - appreciate Dr. [name] input  HTN - BP soft on admission so will hold antihypertensive regimen form home which includes HCTZ and metoprolol Hypothyroidism - continue synthroid  Diabetes mellitus type II - will hold metformin until PO intake improves  Hypokalemia - will supplement and repeat BMP in AM Hyponatremia - secondary to pre renal etiology - IVF and repeat BMP in AM Severe malnutrition - secondary to progressive nature of the illness  - nutrition consultation   Radiological Exams on  Admission: No results found.  Code Status: Full Family Communication: Pt and family at bedside Disposition Plan: Admit for further evaluation    Review of Systems:  Constitutional: Negative for fever, chills. Negative for diaphoresis.  HENT: Negative for hearing loss, ear pain, nosebleeds, congestion, sore throat, neck pain, tinnitus and ear discharge.   Eyes: Negative for blurred vision, double vision, photophobia, pain, discharge and redness.  Respiratory: Negative for cough, hemoptysis, sputum production, shortness of breath, wheezing and stridor.   Cardiovascular: Negative for chest pain, palpitations, orthopnea, claudication and leg swelling.  Gastrointestinal: Negative for heartburn, constipation, blood in stool and melena.  Genitourinary: Negative for dysuria, urgency, frequency, hematuria and flank pain.  Musculoskeletal: Negative for joint pain and falls.  Skin: Negative for itching and rash.  Neurological: Negative for sensory change, speech change, focal weakness, loss of consciousness and headaches.  Endo/Heme/Allergies: Negative for environmental allergies and polydipsia. Does not bruise/bleed easily.  Psychiatric/Behavioral: Negative for suicidal ideas.     Past Medical History  Diagnosis Date  . Arthritis     Left knee, s/p cortisone injection  . GERD (gastroesophageal reflux disease)   . Complication of anesthesia     Very hard to wake up after anesthesia  . Dizziness   . Seasonal allergies   . Frequency of urination   . Dysrhythmia     Sinus tachycardia can get up to 108. Started after chemo   . Hypertension   . Hyperlipidemia   . Thyroid disease   . Hypothyroidism   . Breast cancer 2001    S/P mastectomy, XRT and chemo  . Cancer 10/07/13    pancreatic cancer/adenocarcinoma  . Diabetes mellitus without complication  metformin    Past Surgical History  Procedure Laterality Date  . Joint replacement      right knee  . Carpal tunnel release       bilateral  . Incontinence surgery    . Cataract extraction      left  . Mastectomy      right due to cancer  . Ercp  10/06/2011    Procedure: ENDOSCOPIC RETROGRADE CHOLANGIOPANCREATOGRAPHY (ERCP);  Surgeon: Gatha Mayer, MD;  Location: [facility];  Service: Endoscopy;  Laterality: N/A;  or general per anesthesia  . Sphincterotomy  10/06/2011    Procedure: SPHINCTEROTOMY;  Surgeon: Gatha Mayer, MD;  Location: [facility] ENDOSCOPY;  Service: Endoscopy;;  . Cholecystectomy  10/07/2011    Procedure: LAPAROSCOPIC CHOLECYSTECTOMY WITH INTRAOPERATIVE CHOLANGIOGRAM;  Surgeon: Edward Jolly, MD;  Location: [facility] ORS;  Service: General;  Laterality: N/A;  . Tonsillectomy    . Colonoscopy    . Eye surgery    . Lumbar laminectomy/decompression microdiscectomy  05/27/2012    Procedure: LUMBAR LAMINECTOMY/DECOMPRESSION MICRODISCECTOMY 1 LEVEL;  Surgeon: Ophelia Charter, MD;  Location: [facility];  Service: Neurosurgery;  Laterality: Bilateral;  Lumbar four laminectomy, with bilateral lumbar three laminotomies.  . Ercp N/A 10/04/2013    Procedure: ENDOSCOPIC RETROGRADE CHOLANGIOPANCREATOGRAPHY (ERCP);  Surgeon: Beryle Beams, MD;  Location: [facility] ORS;  Service: Gastroenterology;  Laterality: N/A;  . Eus N/A 10/07/2013    Procedure: UPPER ENDOSCOPIC ULTRASOUND (EUS) LINEAR;  Surgeon: Beryle Beams, MD;  Location: [facility] ENDOSCOPY;  Service: Endoscopy;  Laterality: N/A;    Social History:  reports that she quit smoking about 56 years ago. Her smoking use included Cigarettes. She has a 1.25 pack-year smoking history. She has never used smokeless tobacco. She reports that she drinks alcohol. She reports that she does not use illicit drugs.  Allergies  Allergen Reactions  . Aspirin     GI upset  . Gemfibrozil   . Nsaids Other (See Comments)    "irritates my stomach"  . Pravastatin     Muscle aches  . Pse Hcl-Dm Hbr-Guaifenesin Tan     Palpitations    Family History  Problem Relation Age of Onset  .  Stroke Mother   . Breast cancer Mother     dx in her 61s  . Heart disease Mother   . Stroke Father   . Heart disease Father   . Heart attack Father   . Breast cancer Daughter 96  . Cerebral aneurysm Sister   . Hyperlipidemia Son   . Hypertension Son   . Prostate cancer Maternal Grandfather   . Breast cancer Sister 2  . Breast cancer Other     niece dx in her 11s  . Lung cancer Other   . Cancer Other 20    great niece with cancer of the lining of the heart.    Prior to Admission medications   Medication Sig Start Date End Date Taking? Authorizing Provider  Blood Glucose Monitoring Suppl (BLOOD GLUCOSE MONITOR KIT) KIT by Does not apply route. relion brand   Yes Historical Provider, MD  cetirizine (ZYRTEC) 10 MG tablet Take 10 mg by mouth daily.   Yes Historical Provider, MD  Cholecalciferol (VITAMIN D-3) 5000 UNITS TABS Take by mouth daily.    Yes Historical Provider, MD  dexlansoprazole (DEXILANT) 60 MG capsule Take 60 mg by mouth daily.   Yes Historical Provider, MD  estradiol (ESTRING) 2 MG vaginal ring Place 2 mg vaginally every 3 (three) months.  follow package directions 10/27/13  Yes Vicie Mutters, PA-C  FLAXSEED, LINSEED, PO Take 1 capsule by mouth daily.   Yes Historical Provider, MD  hyaluronate sodium (RADIAPLEXRX) GEL Apply 1 application topically 2 (two) times daily. Apply to  Affected skin  Area after rad tx and bedtime 11/05/13  Yes Marye Round, MD  hydrochlorothiazide (HYDRODIURIL) 25 MG tablet Take 12.5 mg by mouth daily as needed (swelling).    Yes Historical Provider, MD  HYDROcodone-acetaminophen (NORCO/VICODIN) 5-325 MG per tablet Take 1 tablet by mouth every 6 (six) hours as needed for moderate pain. Takes 1/4 to 1 tab every 4-6 hours PRN pain   Yes Historical Provider, MD  levothyroxine (SYNTHROID, LEVOTHROID) 100 MCG tablet Take 50-100 mcg by mouth See admin instructions. Pt takes 1/2 tab for 50 mcg dose on Tuesday,thursday,saturday,sunday. 100 mcg  On Monday,  Wednesday,friday   Yes Historical Provider, MD  Magnesium 250 MG TABS Take 250 mg by mouth daily.    Yes Historical Provider, MD  metoprolol succinate (TOPROL-XL) 100 MG 24 hr tablet Take 25 mg by mouth daily. 12/16/13  Yes Unk Pinto, MD  Multiple Vitamin (MULTIVITAMIN) tablet Take 1 tablet by mouth daily.   Yes Historical Provider, MD  Omega-3 Fatty Acids (FISH OIL) 1000 MG CAPS Take 3,000 mg by mouth daily.    Yes Historical Provider, MD  ondansetron (ZOFRAN) 8 MG tablet Take 8-16 mg by mouth every 8 (eight) hours as needed for nausea. Take 1-2 tabs TID for nausea 12/24/13  Yes Unk Pinto, MD  sucralfate (CARAFATE) 1 G tablet Take 1 tablet (1 g total) by mouth 2 (two) times daily. 08/14/13 08/14/14 Yes Vicie Mutters, PA-C  TRUEPLUS LANCETS 33G MISC  10/05/13  Yes Historical Provider, MD  metFORMIN (GLUCOPHAGE-XR) 500 MG 24 hr tablet Take 1,000 mg by mouth 2 (two) times daily. 10/08/13 10/09/14  Unk Pinto, MD    Physical Exam: Filed Vitals:   12/31/13 1047 12/31/13 1319  BP: 89/53 131/74  Pulse: 114 98  Temp: 97.6 F (36.4 C)   TempSrc: Oral   Resp: 16 16  SpO2: 96% 96%    Physical Exam  Constitutional: Appears stable. No distress. Cachectic  HENT: Normocephalic. External right and left ear normal. Dry MM, oral thrush  Eyes: Conjunctivae and EOM are normal. PERRLA, no scleral icterus.  Neck: Normal ROM. Neck supple. No JVD. No tracheal deviation. No thyromegaly.  CVS: RRR, S1/S2 +, no murmurs, no gallops, no carotid bruit.  Pulmonary: Effort and breath sounds normal, no stridor, rhonchi, wheezes, rales.  Abdominal: Soft. BS +,  no distension, tenderness in epigastric area, no rebound or guarding.  Musculoskeletal: Normal range of motion. No edema and no tenderness.  Lymphadenopathy: No lymphadenopathy noted, cervical, inguinal. Neuro: Alert. Normal reflexes, muscle tone coordination. No cranial nerve deficit. Skin: Skin is warm and dry. No rash noted. Not diaphoretic. No  erythema. No pallor.  Psychiatric: Normal mood and affect. Behavior, judgment, thought content normal.   Labs on Admission:  Basic Metabolic Panel:  Recent Labs Lab 12/31/13 1250  NA 132*  K 3.4*  CL 91*  CO2 30  GLUCOSE 171*  BUN 10  CREATININE 0.50  CALCIUM 8.2*   Liver Function Tests:  Recent Labs Lab 12/31/13 1250  AST 35  ALT 37*  ALKPHOS 371*  BILITOT 0.4  PROT 4.9*  ALBUMIN 2.1*   CBC:  Recent Labs Lab 12/31/13 1250  WBC 7.0  NEUTROABS 4.1  HGB 12.1  HCT 35.3*  MCV 91.5  PLT 174   EKG: Normal sinus rhythm, no ST/T wave changes  Theodis Blaze, MD  [facility] Pager [phone]  If 7PM-7AM, please contact night-coverage [email or web address] Password [email or web address] 12/31/2013, 3:11 PM

## 2013-12-31 NOTE — ED Notes (Signed)
IV fluids paused

## 2013-12-31 NOTE — ED Notes (Signed)
Pt has pancreatic cancer and last chemo treatment was about 2-3 weeks ago. Ever since pt hasnt been eating or drinking like normal.  Pt was referred to come here by her PCP. Pt has lost about 22 lbs over the past 4-5 weeks.

## 2014-01-01 DIAGNOSIS — E860 Dehydration: Secondary | ICD-10-CM

## 2014-01-01 DIAGNOSIS — E039 Hypothyroidism, unspecified: Secondary | ICD-10-CM

## 2014-01-01 DIAGNOSIS — R5381 Other malaise: Secondary | ICD-10-CM

## 2014-01-01 DIAGNOSIS — Z515 Encounter for palliative care: Secondary | ICD-10-CM

## 2014-01-01 DIAGNOSIS — E43 Unspecified severe protein-calorie malnutrition: Secondary | ICD-10-CM | POA: Insufficient documentation

## 2014-01-01 DIAGNOSIS — R630 Anorexia: Secondary | ICD-10-CM

## 2014-01-01 DIAGNOSIS — R531 Weakness: Secondary | ICD-10-CM

## 2014-01-01 DIAGNOSIS — K769 Liver disease, unspecified: Secondary | ICD-10-CM

## 2014-01-01 DIAGNOSIS — R5383 Other fatigue: Secondary | ICD-10-CM

## 2014-01-01 DIAGNOSIS — Z66 Do not resuscitate: Secondary | ICD-10-CM

## 2014-01-01 LAB — BASIC METABOLIC PANEL
BUN: 10 mg/dL (ref 6–23)
CHLORIDE: 93 meq/L — AB (ref 96–112)
CO2: 28 mEq/L (ref 19–32)
Calcium: 7.6 mg/dL — ABNORMAL LOW (ref 8.4–10.5)
Creatinine, Ser: 0.44 mg/dL — ABNORMAL LOW (ref 0.50–1.10)
GFR calc Af Amer: >90 mL/min (ref >90)
GLUCOSE: 156 mg/dL — AB (ref 70–99)
Potassium: 3.5 mEq/L — ABNORMAL LOW (ref 3.7–5.3)
SODIUM: 132 meq/L — AB (ref 137–147)

## 2014-01-01 LAB — CBC
HEMATOCRIT: 31.8 % — AB (ref 36.0–46.0)
HEMOGLOBIN: 10.8 g/dL — AB (ref 12.0–15.0)
MCH: 31.6 pg (ref 26.0–34.0)
MCHC: 34 g/dL (ref 30.0–36.0)
MCV: 93 fL (ref 78.0–100.0)
Platelets: 160 10*3/uL (ref 150–400)
RBC: 3.42 MIL/uL — AB (ref 3.87–5.11)
RDW: 15.8 % — ABNORMAL HIGH (ref 11.5–15.5)
WBC: 6.5 10*3/uL (ref 4.0–10.5)

## 2014-01-01 LAB — URINE CULTURE
COLONY COUNT: NO GROWTH
CULTURE: NO GROWTH

## 2014-01-01 LAB — HEMOGLOBIN A1C
Hgb A1c MFr Bld: 7.7 % — ABNORMAL HIGH (ref <5.7)
Mean Plasma Glucose: 174 mg/dL — ABNORMAL HIGH (ref <117)

## 2014-01-01 LAB — TSH: TSH: 24.33 u[IU]/mL — ABNORMAL HIGH (ref 0.350–4.500)

## 2014-01-01 MED ORDER — GLUCERNA SHAKE PO LIQD
237.0000 mL | Freq: Two times a day (BID) | ORAL | Status: DC
  Administered 2014-01-01 – 2014-01-05 (×7): 237 mL via ORAL
  Filled 2014-01-01 (×10): qty 237

## 2014-01-01 MED ORDER — POTASSIUM CHLORIDE 20 MEQ/15ML (10%) PO LIQD
40.0000 meq | Freq: Once | ORAL | Status: AC
  Administered 2014-01-01: 40 meq via ORAL
  Filled 2014-01-01: qty 30

## 2014-01-01 MED ORDER — HYDROMORPHONE HCL PF 1 MG/ML IJ SOLN: 1.0000 mg | Freq: Two times a day (BID) | INTRAMUSCULAR | Status: DC | PRN

## 2014-01-01 NOTE — Progress Notes (Signed)
[name]   DOB:30-Dec-1927   CX#:[id number]   SHF#:[id number]  Subjective: I spoke with her son Chip.  No acute overnight events.    Objective:  Filed Vitals:   01/01/14 0615  BP: 137/75  Pulse: 94  Temp: 98 F (36.7 C)  Resp: 18    Body mass index is 18.59 kg/(m^2).  Intake/Output Summary (Last 24 hours) at 01/01/14 0857 Last data filed at 01/01/14 0700  Gross per 24 hour  Intake 993.75 ml  Output      0 ml  Net 993.75 ml    Chronically ill appearing sitting in her chair.   Sclerae unicteric  Oropharynx clear  No peripheral adenopathy  Lungs clear -- no rales or rhonchi  Heart regular rate and rhythm  Abdomen benign  MSK  no peripheral edema  Neuro nonfocal    CBG (last 3)  No results found for this basename: GLUCAP,  in the last 72 hours   Labs:  Lab Results  Component Value Date   WBC 6.5 01/01/2014   HGB 10.8* 01/01/2014   HCT 31.8* 01/01/2014   MCV 93.0 01/01/2014   PLT 160 01/01/2014   NEUTROABS 4.1 12/07/7739    Basic Metabolic Panel:  Recent Labs Lab 12/31/13 1250 01/01/14 0432  NA 132* 132*  K 3.4* 3.5*  CL 91* 93*  CO2 30 28  GLUCOSE 171* 156*  BUN 10 10  CREATININE 0.50 0.44*  CALCIUM 8.2* 7.6*  MG 1.7  --    GFR Estimated Creatinine Clearance: 37.5 ml/min (by C-G formula based on Cr of 0.44). Liver Function Tests:  Recent Labs Lab 12/31/13 1250  AST 35  ALT 37*  ALKPHOS 371*  BILITOT 0.4  PROT 4.9*  ALBUMIN 2.1*   CBC:  Recent Labs Lab 12/31/13 1250 01/01/14 0432  WBC 7.0 6.5  NEUTROABS 4.1  --   HGB 12.1 10.8*  HCT 35.3* 31.8*  MCV 91.5 93.0  PLT 174 160   Hgb A1c  Recent Labs  12/31/13 1856  HGBA1C 7.7*   Lipid Profile No results found for this basename: CHOL, HDL, LDLCALC, TRIG, CHOLHDL, LDLDIRECT,  in the last 72 hours Thyroid function studies  Recent Labs  01/01/14 0430  TSH 24.330*   Anemia work up No results found for this basename: VITAMINB12, FOLATE, FERRITIN, TIBC, IRON, RETICCTPCT,  in the  last 72 hours Microbiology No results found for this or any previous visit (from the past 240 hour(s)).    Studies:  No results found.  Assessment/Plan: 80 y.o.  1. Probable Stage IV Pancreatic cancer --Scans demonstrate new liver lesions despite chemoradiation. Patient declines further chemotherapy.  She requests hospice consult.  I spoke with family and they discussed that they wanted hospice consultation based on her functional decline. I placed an order for hospice consultation.  Her prognosis is weeks to months based on her rapid decline.   2. Code status. --Patient is DNR.  I verified her wishes with her son.  I placed an order for DNR.  3. Elevated TSH. --Please consider add free t3.  Likely sick thyroid doing illness.   4. Dehydration. --Likely secondary to chemoradiation plus #1.  Continue intravenous hydration.  5. Poor nutrition. --Continue marinol as tolerated.  This can make her confused.  Close monitoring.  Nutrition consult.    Thanks for taking care of [name].   Concha Norway, MD 01/01/2014  8:57 AM

## 2014-01-01 NOTE — Care Management Note (Unsigned)
    Page 1 of 2   01/05/2014     1:53:17 PM CARE MANAGEMENT NOTE 01/05/2014  Patient:  [name], [name]   Account Number:  [id number]  Date Initiated:  01/01/2014  Documentation initiated by:  [facility]  Subjective/Objective Assessment:   80 year old female with hx pancreatic cancer admitted with failure to thrive, dysphagia d/t thrush and abdominal pain.     Action/Plan:   From home, active with [facility]. Does have 24/7 care. Per MD's note, family asking for hospice services and palliative care consult has been placed.   Anticipated DC Date:  01/05/2014   Anticipated DC Plan:  [place]  CM consult      [facility] Choice  Resumption Of Svcs/PTA Provider   Choice offered to / List presented to:  C-4 Adult Children        HH arranged  HH-1 RN  [place].   Status of service:  Completed, signed off Medicare Important Message given?  YES (If response is "NO", the following Medicare IM given date fields will be blank) Date Medicare IM given:  01/05/2014 Date Additional Medicare IM given:    Discharge Disposition:    Per UR Regulation:  Reviewed for med. necessity/level of care/duration of stay  If discussed at [place] of Stay Meetings, dates discussed:    Comments:  01/02/14 Allene Dillon RN BSN [phone] Spoke with son Chip about d/c planning. Plan is for home with home health services provided by [facility] as per before admission. He is asking for some transfer equipment and I informed him I would make [facility] aware to make the assessment and recommendation once discharged to home. I spoke with Colletta [place] with [facility] and made her aware of this request. Family will transport home.

## 2014-01-01 NOTE — Progress Notes (Signed)
Patient ID: [name], female   DOB: 11/27/1927, 80 y.o.   MRN: [id number]  [facility] PROGRESS NOTE  RAYANNE PADMANABHAN DXI:[id number] DOB: 29-Mar-1928 DOA: 12/31/2013 PCP: [name], MD  Brief narrative: Pt is 80 yo female with pancreatic cancer, follows with Dr. [name], presented to [facility] ED with main concern of progressive failure to thrive, poor oral intake, generalized weakness. Pt explains she underwent chemotherapy few weeks ago and was getting progressively weaker and unable to eat much. She also reports intermittent upper quadrants abdominal pain, throbbing, 5/10 in severity, non radiating, no specific alleviating or aggravating factors. She denies fevers, chills, no chest pain or shortness of breath, no specific focal neurological symptoms.  In ED, pt is hemodynamically stable, physical exam notable for clinical dehydration. TR[facility] asked to admit for further evaluation.   Assessment and Plan:  Active Problems:  Generalized weakness with FTT  - most likely secondary to progressive nature of pancreatic cancer  - pt reports feeling better this AM - continue to provide IVF, analgesia, antiemetics as needed  - will need nutrition consultation, PT/OT once able to participate  - advance diet as pt able to tolerate  Dysphagia  - oral thrush noted  - continue Fluconazole IV day #2 Pancreatic cancer  - appreciate Dr. [name] input  HTN  - BP soft on admission so will continue to hold antihypertensive regimen form home which includes HCTZ and metoprolol  Hypothyroidism  - continue synthroid  Diabetes mellitus type II  - will hold metformin until PO intake improves  Hypokalemia  - continue to supplement and repeat BMP in AM Hyponatremia  - secondary to pre renal etiology  - IVF and repeat BMP in AM  Severe malnutrition  - secondary to progressive nature of the illness  - nutrition consultation appreciated   Consultants:  Oncology   Procedures/Studies:  None    Antibiotics:  None   Code Status: DNR Family Communication: Pt at bedside Disposition Plan: remains inpatient   HPI/Subjective: No events overnight.   Objective: Filed Vitals:   12/31/13 1807 12/31/13 2230 01/01/14 0615 01/01/14 1409  BP:  134/84 137/75 113/78  Pulse:  97 94 117  Temp:  98.2 F (36.8 C) 98 F (36.7 C) 97.4 F (36.3 C)  TempSrc:  Oral Oral Oral  Resp:  18 18 18   Height: 5\' 1"  (1.549 m)     Weight: 44.6 kg (98 lb 5.2 oz)     SpO2:  94% 94% 97%    Intake/Output Summary (Last 24 hours) at 01/01/14 1531 Last data filed at 01/01/14 1300  Gross per 24 hour  Intake 1163.75 ml  Output      0 ml  Net 1163.75 ml    Exam:   General:  Pt is alert, follows commands appropriately, not in acute distress  Cardiovascular: Regular rhythm, tachycardic, S1/S2, no murmurs, no rubs, no gallops  Respiratory: Clear to auscultation bilaterally, no wheezing, no crackles, no rhonchi  Abdomen: Soft, non tender, non distended, bowel sounds present, no guarding  Extremities: No edema, pulses DP and PT palpable bilaterally  Data Reviewed: Basic Metabolic Panel:  Recent Labs Lab 12/31/13 1250 01/01/14 0432  NA 132* 132*  K 3.4* 3.5*  CL 91* 93*  CO2 30 28  GLUCOSE 171* 156*  BUN 10 10  CREATININE 0.50 0.44*  CALCIUM 8.2* 7.6*  MG 1.7  --    Liver Function Tests:  Recent Labs Lab 12/31/13 1250  AST 35  ALT 37*  ALKPHOS  371*  BILITOT 0.4  PROT 4.9*  ALBUMIN 2.1*   CBC:  Recent Labs Lab 12/31/13 1250 01/01/14 0432  WBC 7.0 6.5  NEUTROABS 4.1  --   HGB 12.1 10.8*  HCT 35.3* 31.8*  MCV 91.5 93.0  PLT 174 160     Scheduled Meds: . dronabinol  5 mg Oral BID AC  . enoxaparin (LOVENOX) injection  30 mg Subcutaneous Q24H  . feeding supplement (GLUCERNA SHAKE)  237 mL Oral BID BM  . fluconazole (DIFLUCAN) IV  100 mg Intravenous Q24H  . hyaluronate sodium  1 application Topical BID  . [START ON 01/02/2014] levothyroxine  100 mcg Oral Once per  day on Mon Wed Fri  . levothyroxine  50 mcg Oral Once per day on Sun Tue Thu Sat  . pantoprazole  40 mg Oral Daily  . sucralfate  1 g Oral BID   Continuous Infusions: . sodium chloride 75 mL/hr at 12/31/13 1745   Theodis Blaze, MD  [facility] Pager [phone]  If 7PM-7AM, please contact night-coverage [email or web address] Password [email or web address] 01/01/2014, 3:31 PM   LOS: 1 day

## 2014-01-01 NOTE — Progress Notes (Signed)
Clinical Social Work  CSW received referral to assist with SNF placement. Per chart review, patient and family now interested in palliative consult. CSW will follow up after PMT completes [place] meeting in order to assist with DC planning.  [place], [employer] [phone]

## 2014-01-01 NOTE — Consult Note (Signed)
Patient OM:[facility] [name]      DOB: February 05, 1928      NOB:[id number]     Consult Note from the Palliative Medicine Team at [place] Requested by: Dr Doyle Askew     PCP: [name], MD Reason for Consultation: Clarifiction of [place] and options     Phone Number:[phone]  Assessment of patients Current state:  Patient is faced with advanced directive decisions and anticipatory care needs in an overall poor prognosis situation, prognosis is likely six months or less.   Consult is for review of medical treatment options, clarification of goals of care and end of life issues, disposition and options, and symptom recommendation.  This NP Wadie Lessen reviewed medical records, received report from team, assessed the patient and then meet at the patient's bedside along with her son  Chip Ulbrich, and DIL Ethyle Tiedt and daughter Vale [place] by telephone conference  to discuss diagnosis prognosis, [place], EOL wishes disposition and options.  A detailed discussion was had today regarding advanced directives.  Concepts specific to code status, artifical feeding and hydration, continued IV antibiotics and rehospitalization was had.  The difference between a aggressive medical intervention path  and a palliative comfort care path for this patient at this time was had.  Values and goals of care important to patient and family were attempted to be elicited.  Concept of Hospice and Palliative Care were discussed  Natural trajectory and expectations at EOL were discussed.  Questions and concerns addressed.  Hard Choices booklet left for review.  MOST form reviewed  Family encouraged to call with questions or concerns.  PMT will continue to support holistically.   Goals of Care: 1.  Code Status: DNR/DNI   2. Scope of Treatment: 1. Vital Signs:daily 2. Chemotherapy/Radiation-no further treatment for cancer diagnosis 3. Respiratory/Oxygen:as needed for comfort 4. Nutritional  Support/Tube Feeds: no artifical feeding now or in the future 5. Antibiotics: yes for now 6. IVF: as needed for dehydration    3. Disposition:  Patient and family are in processing situation. Considering SNF for rehab vs home health vs home with hospice services.     4. Symptom Management:   1.  Weakness:  "for now" continue PT/OT until discharge plan in place.  Encourage frequent oral fluids   5. Psychosocial:  Emotional support offered to patient and her family, all understand the limited prognosis and remain hopeful for improvement and "more" quality time.  6. Spiritual:  Comfort from her personal faith.     Patient Documents Completed or Given: Document Given Completed  Advanced Directives Pkt    MOST yes   DNR    Gone from My Sight    Hard Choices yes     Brief HPI:  Pt is 80 yo female with pancreatic cancer, followed by  Dr. [name], presented to [facility] ED with main concern of progressive failure to thrive, poor oral intake, generalized weakness. Pt explains she underwent chemotherapy few weeks ago and was getting progressively weaker and unable to eat much.  S/P radiation tx.  Patient declines any further cancer treatments, but remains hopeful for some  Improvement with quality time     ROS: weakness, fatigue, poor appitite   PMH:  Past Medical History  Diagnosis Date  . Arthritis     Left knee, s/p cortisone injection  . GERD (gastroesophageal reflux disease)   . Complication of anesthesia     Very hard to wake up after anesthesia  . Dizziness   .  Seasonal allergies   . Frequency of urination   . Dysrhythmia     Sinus tachycardia can get up to 108. Started after chemo   . Hypertension   . Hyperlipidemia   . Thyroid disease   . Hypothyroidism   . Breast cancer 2001    S/P mastectomy, XRT and chemo  . Cancer 10/07/13    pancreatic cancer/adenocarcinoma  . Diabetes mellitus without complication     metformin     PSH: Past Surgical History  Procedure  Laterality Date  . Joint replacement      right knee  . Carpal tunnel release      bilateral  . Incontinence surgery    . Cataract extraction      left  . Mastectomy      right due to cancer  . Ercp  10/06/2011    Procedure: ENDOSCOPIC RETROGRADE CHOLANGIOPANCREATOGRAPHY (ERCP);  Surgeon: Gatha Mayer, MD;  Location: [facility];  Service: Endoscopy;  Laterality: N/A;  or general per anesthesia  . Sphincterotomy  10/06/2011    Procedure: SPHINCTEROTOMY;  Surgeon: Gatha Mayer, MD;  Location: [facility] ENDOS[facility];  Service: Endoscopy;;  . Cholecystectomy  10/07/2011    Procedure: LAPAROSCOPIC CHOLECYSTECTOMY WITH INTRAOPERATIVE CHOLANGIOGRAM;  Surgeon: Edward Jolly, MD;  Location: [facility] ORS;  Service: General;  Laterality: N/A;  . Tonsillectomy    . Colonoscopy    . Eye surgery    . Lumbar laminectomy/decompression microdiscectomy  05/27/2012    Procedure: LUMBAR LAMINECTOMY/DECOMPRESSION MICRODISCECTOMY 1 LEVEL;  Surgeon: Ophelia Charter, MD;  Location: [place] [facility];  Service: Neurosurgery;  Laterality: Bilateral;  Lumbar four laminectomy, with bilateral lumbar three laminotomies.  . Ercp N/A 10/04/2013    Procedure: ENDOSCOPIC RETROGRADE CHOLANGIOPANCREATOGRAPHY (ERCP);  Surgeon: Beryle Beams, MD;  Location: [facility] ORS;  Service: Gastroenterology;  Laterality: N/A;  . Eus N/A 10/07/2013    Procedure: UPPER ENDOSCOPIC ULTRASOUND (EUS) LINEAR;  Surgeon: Beryle Beams, MD;  Location: [facility] ENDOSCOPY;  Service: Endoscopy;  Laterality: N/A;   I have reviewed the [place] and SH and  If appropriate update it with new information. Allergies  Allergen Reactions  . Aspirin     GI upset  . Gemfibrozil   . Nsaids Other (See Comments)    "irritates my stomach"  . Pravastatin     Muscle aches  . Pse Hcl-Dm Hbr-Guaifenesin Tan     Palpitations   Scheduled Meds: . dronabinol  5 mg Oral BID AC  . enoxaparin (LOVENOX) injection  30 mg Subcutaneous Q24H  . feeding supplement (GLUCERNA SHAKE)  237 mL Oral  BID BM  . fluconazole (DIFLUCAN) IV  100 mg Intravenous Q24H  . hyaluronate sodium  1 application Topical BID  . [START ON 01/02/2014] levothyroxine  100 mcg Oral Once per day on Mon Wed Fri  . levothyroxine  50 mcg Oral Once per day on Sun Tue Thu Sat  . pantoprazole  40 mg Oral Daily  . sucralfate  1 g Oral BID   Continuous Infusions: . sodium chloride 75 mL/hr at 12/31/13 1745   PRN Meds:.HYDROcodone-acetaminophen, HYDROmorphone (DILAUDID) injection, LORazepam, ondansetron (ZOFRAN) IV, ondansetron    BP 113/78  Pulse 117  Temp(Src) 97.4 F (36.3 C) (Oral)  Resp 18  Ht 5\' 1"  (1.549 m)  Wt 44.6 kg (98 lb 5.2 oz)  BMI 18.59 kg/m2  SpO2 97%   PPS: 40%   Intake/Output Summary (Last 24 hours) at 01/01/14 1413 Last data filed at 01/01/14 1300  Gross per 24  hour  Intake 1163.75 ml  Output      0 ml  Net 1163.75 ml    Physical Exam:  General: chronically ill appearing, NAD, weak HEENT:  Moist membranes, no exudate Chest:   CTA CVS: tachycardic Abdomen: soft +BS, non tender on gentle exam Ext:  Without edema Neuro: oriented X3  Labs: CBC    Component Value Date/Time   WBC 6.5 01/01/2014 0432   WBC 3.5* 12/08/2013 1150   RBC 3.42* 01/01/2014 0432   RBC 3.69* 12/08/2013 1150   HGB 10.8* 01/01/2014 0432   HGB 11.9 12/08/2013 1150   HCT 31.8* 01/01/2014 0432   HCT 35.8 12/08/2013 1150   PLT 160 01/01/2014 0432   PLT 179 12/08/2013 1150   MCV 93.0 01/01/2014 0432   MCV 97.0 12/08/2013 1150   MCH 31.6 01/01/2014 0432   MCH 32.3 12/08/2013 1150   MCHC 34.0 01/01/2014 0432   MCHC 33.3 12/08/2013 1150   RDW 15.8* 01/01/2014 0432   RDW 14.0 12/08/2013 1150   LYMPHSABS 1.9 12/31/2013 1250   LYMPHSABS 0.1* 12/08/2013 1150   MONOABS 0.9 12/31/2013 1250   MONOABS 0.8 12/08/2013 1150   EOSABS 0.0 12/31/2013 1250   EOSABS 0.0 12/08/2013 1150   BASOSABS 0.1 12/31/2013 1250   BASOSABS 0.0 12/08/2013 1150    BMET    Component Value Date/Time   NA 132* 01/01/2014 0432   NA 130* 12/08/2013 1150   K 3.5*  01/01/2014 0432   K 4.5 12/08/2013 1150   CL 93* 01/01/2014 0432   CO2 28 01/01/2014 0432   CO2 26 12/08/2013 1150   GLUCOSE 156* 01/01/2014 0432   GLUCOSE 167* 12/08/2013 1150   BUN 10 01/01/2014 0432   BUN 13.8 12/08/2013 1150   CREATININE 0.44* 01/01/2014 0432   CREATININE 0.7 12/08/2013 1150   CREATININE 0.69 10/15/2013 1313   CALCIUM 7.6* 01/01/2014 0432   CALCIUM 9.2 12/08/2013 1150   GFRNONAA >90 01/01/2014 0432   GFRNONAA 81 10/15/2013 1313   GFRAA >90 01/01/2014 0432   GFRAA >89 10/15/2013 1313    CMP     Component Value Date/Time   NA 132* 01/01/2014 0432   NA 130* 12/08/2013 1150   K 3.5* 01/01/2014 0432   K 4.5 12/08/2013 1150   CL 93* 01/01/2014 0432   CO2 28 01/01/2014 0432   CO2 26 12/08/2013 1150   GLUCOSE 156* 01/01/2014 0432   GLUCOSE 167* 12/08/2013 1150   BUN 10 01/01/2014 0432   BUN 13.8 12/08/2013 1150   CREATININE 0.44* 01/01/2014 0432   CREATININE 0.7 12/08/2013 1150   CREATININE 0.69 10/15/2013 1313   CALCIUM 7.6* 01/01/2014 0432   CALCIUM 9.2 12/08/2013 1150   PROT 4.9* 12/31/2013 1250   PROT 5.6* 12/08/2013 1150   ALBUMIN 2.1* 12/31/2013 1250   ALBUMIN 2.7* 12/08/2013 1150   AST 35 12/31/2013 1250   AST 21 12/08/2013 1150   ALT 37* 12/31/2013 1250   ALT 14 12/08/2013 1150   ALKPHOS 371* 12/31/2013 1250   ALKPHOS 97 12/08/2013 1150   BILITOT 0.4 12/31/2013 1250   BILITOT 0.49 12/08/2013 1150   GFRNONAA >90 01/01/2014 0432   GFRNONAA 81 10/15/2013 1313   GFRAA >90 01/01/2014 0432   GFRAA >89 10/15/2013 1313     Time In Time Out Total Time Spent with Patient Total Overall Time  1445 1615 80 min 90 min    Greater than 50%  of this time was spent counseling and coordinating care related to the above assessment  and plan.   Wadie Lessen NP  Palliative Medicine Team Team Phone # [phone] Pager [phone]  Discussed with Sindy Messing LCSW

## 2014-01-01 NOTE — Consult Note (Signed)
I have reviewed this case with our NP and agree with the Assessment and Plan as stated.  Qasim Diveley L. Willowdean Luhmann, MD MBA The Palliative Medicine Team at [place] Team Phone: [phone] Pager: [phone]   

## 2014-01-01 NOTE — Progress Notes (Signed)
PT Cancellation Note  Patient Details Name: [name] MRN: [id number] DOB: [birth year]   Cancelled Treatment:    Reason Eval/Treat Not Completed: Fatigue/lethargy limiting ability to participate--family requests PT check back on tomorrow morning to attempt evaluation.    Weston Anna, MPT Pager: [phone]

## 2014-01-01 NOTE — Progress Notes (Signed)
INITIAL NUTRITION ASSESSMENT  Pt meets criteria for severe MALNUTRITION in the context of chronic illness as evidenced by 9.2% weight loss with <75% estimated energy intake in the past month with severe muscle wasting and subcutaneous fat loss in upper arms and hands.  DOCUMENTATION CODES Per approved criteria  -Severe malnutrition in the context of chronic illness -Underweight   INTERVENTION: - Glucerna shakes BID - Nutrition per palliative care meeting - RD to monitor plan of care   NUTRITION DIAGNOSIS: Inadequate oral intake related to nausea, poor appetite as evidenced by pt report.   Goal: 1. Resolution of nausea 2. Nutrition per pt wishes  Monitor:  Weights, labs, intake, goals of care  Reason for Assessment: Malnutrition screening tool, consult for assessment   79 y.o. female  Admitting Dx: Abdominal pain and failure to thrive  ASSESSMENT: Pt with probable stage IV pancreatic cancer, follows with Dr. [name], presented to [facility] ED with main concern of progressive failure to thrive, poor oral intake, generalized weakness. Pt explains she underwent chemotherapy few weeks ago and was getting progressively weaker and unable to eat much. Oncologist met with pt and family and plan is for hospice consult r/t functional decline with prognosis of weeks to months. Per conversation with RN, pt kind of loopy this morning.   -Met with pt who reports she has not been able to eat much recently r/t nausea that she's had for 4 weeks causing 22 pound unintended weight loss -Weight trend shows pt's weight down 10 pounds in the past month -Pt c/o thrush and changes in taste with now not liking sweet foods/beverages, denies mouth sores -States she's been drinking 1-2 Glucerna/day at home -Denies any problems chewing or swallowing -Reports eating a few bites of eggs for breakfast this morning -C/o feeling weaker in her upper versus lower body -Getting Marinol -Palliative care consult  ordered  Potassium low, getting oral replacement Alk phos and ALT elevated  Nutrition Focused Physical Exam:  Subcutaneous Fat:  Orbital Region: severe muscle wasting and subcutaneous fat loss Upper Arm Region: severe muscle wasting and subcutaneous fat loss Thoracic and Lumbar Region: NA  Muscle:  Temple Region: severe muscle wasting and subcutaneous fat loss Clavicle Bone Region: mild fat loss Clavicle and Acromion Bone Region: mild fat loss Dorsal Hand: severe muscle wasting and subcutaneous fat loss Patellar Region: NA Anterior Thigh Region: NA Posterior Calf Region: NA  Edema: Lower extremity edema    Height: Ht Readings from Last 1 Encounters:  12/31/13 $RemoveB'5\' 1"'[id number]$  (1.549 m)    Weight: Wt Readings from Last 1 Encounters:  12/31/13 98 lb 5.2 oz (44.6 kg)    Ideal Body Weight: 105 lbs  % Ideal Body Weight: 93%  Wt Readings from Last 10 Encounters:  12/31/13 98 lb 5.2 oz (44.6 kg)  12/31/13 98 lb 6.4 oz (44.634 kg)  12/14/13 108 lb (48.988 kg)  12/10/13 103 lb 11.2 oz (47.038 kg)  12/08/13 102 lb 4.8 oz (46.403 kg)  12/05/13 106 lb 3.2 oz (48.172 kg)  11/26/13 106 lb 14.4 oz (48.49 kg)  11/26/13 108 lb (48.988 kg)  11/24/13 106 lb 6.4 oz (48.263 kg)  11/21/13 108 lb 4.8 oz (49.125 kg)    Usual Body Weight: 120 lbs per pt  % Usual Body Weight: 82%  BMI:  Body mass index is 18.59 kg/(m^2). Underweight  Estimated Nutritional Needs: Kcal: 1400-1600 Protein: 70-90g Fluid: 1.4-1.6L/day  Skin: Intact  Diet Order: General  EDUCATION NEEDS: -No education needs identified at this time  Intake/Output Summary (Last 24 hours) at 01/01/14 0926 Last data filed at 01/01/14 0700  Gross per 24 hour  Intake 993.75 ml  Output      0 ml  Net 993.75 ml    Last BM: PTA  Labs:   Recent Labs Lab 12/31/13 1250 01/01/14 0432  NA 132* 132*  K 3.4* 3.5*  CL 91* 93*  CO2 30 28  BUN 10 10  CREATININE 0.50 0.44*  CALCIUM 8.2* 7.6*  MG 1.7  --   GLUCOSE  171* 156*    CBG (last 3)  No results found for this basename: GLUCAP,  in the last 72 hours  Scheduled Meds: . dronabinol  5 mg Oral BID AC  . enoxaparin (LOVENOX) injection  30 mg Subcutaneous Q24H  . fluconazole (DIFLUCAN) IV  100 mg Intravenous Q24H  . hyaluronate sodium  1 application Topical BID  . [START ON 01/02/2014] levothyroxine  100 mcg Oral Once per day on Mon Wed Fri  . levothyroxine  50 mcg Oral Once per day on Sun Tue Thu Sat  . pantoprazole  40 mg Oral Daily  . sucralfate  1 g Oral BID    Continuous Infusions: . sodium chloride 75 mL/hr at 12/31/13 1745    Past Medical History  Diagnosis Date  . Arthritis     Left knee, s/p cortisone injection  . GERD (gastroesophageal reflux disease)   . Complication of anesthesia     Very hard to wake up after anesthesia  . Dizziness   . Seasonal allergies   . Frequency of urination   . Dysrhythmia     Sinus tachycardia can get up to 108. Started after chemo   . Hypertension   . Hyperlipidemia   . Thyroid disease   . Hypothyroidism   . Breast cancer 2001    S/P mastectomy, XRT and chemo  . Cancer 10/07/13    pancreatic cancer/adenocarcinoma  . Diabetes mellitus without complication     metformin    Past Surgical History  Procedure Laterality Date  . Joint replacement      right knee  . Carpal tunnel release      bilateral  . Incontinence surgery    . Cataract extraction      left  . Mastectomy      right due to cancer  . Ercp  10/06/2011    Procedure: ENDOSCOPIC RETROGRADE CHOLANGIOPANCREATOGRAPHY (ERCP);  Surgeon: Gatha Mayer, MD;  Location[facility];  Service: Endoscopy;  Laterality: N/A;  or general per anesthesia  . Sphincterotomy  10/06/2011    Procedure: SPHINCTEROTOMY;  Surgeon: Gatha Mayer, MD;  [facility];  Service: Endoscopy;;  . Cholecystectomy  10/07/2011    Procedure: LAPAROSCOPIC CHOLECYSTECTOMY WITH INTRAOPERATIVE CHOLANGIOGRAM;  Surgeon: Edward Jolly, MD;  [facility];  Service: General;  Laterality: N/A;  . Tonsillectomy    . Colonoscopy    . Eye surgery    . Lumbar laminectomy/decompression microdiscectomy  05/27/2012    Procedure: LUMBAR LAMINECTOMY/DECOMPRESSION MICRODISCECTOMY 1 LEVEL;  Surgeon: Ophelia Charter, MD;  Location: [place] [facility] OR[facility];  Service: Neurosurgery;  Laterality: Bilateral;  Lumbar four laminectomy, with bilateral lumbar three laminotomies.  . Ercp N/A 10/04/2013    Procedure: ENDOSCOPIC RETROGRADE CHOLANGIOPANCREATOGRAPHY (ERCP);  Surgeon: Beryle Beams, MD;  [facility];  Service: Gastroenterology;  Laterality: N/A;  . Eus N/A 10/07/2013    Procedure: UPPER ENDOSCOPIC ULTRASOUND (EUS) LINEAR;  Surgeon: Beryle Beams, MD;  [facility];  Service: Endoscopy;  Laterality: N/A;    Mikey College MS, [place], [place] Pager [phone] After Hours Pager

## 2014-01-01 NOTE — Progress Notes (Signed)
Received referral from ED RNCM regarding [employer] services for nutritional supplements. Noted family wishes to have a palliative care consult. Will continue to follow and will engage if appropriate. Spoke with inpatient RNCM regarding referral.  Marthenia Rolling, MSN- RN, [facility] [phone]

## 2014-01-02 LAB — BASIC METABOLIC PANEL
BUN: 10 mg/dL (ref 6–23)
CHLORIDE: 95 meq/L — AB (ref 96–112)
CO2: 28 meq/L (ref 19–32)
Calcium: 7.9 mg/dL — ABNORMAL LOW (ref 8.4–10.5)
Creatinine, Ser: 0.57 mg/dL (ref 0.50–1.10)
GFR calc Af Amer: >90 mL/min (ref >90)
GFR, EST NON AFRICAN AMERICAN: 83 mL/min — AB (ref >90)
Glucose, Bld: 104 mg/dL — ABNORMAL HIGH (ref 70–99)
POTASSIUM: 3.8 meq/L (ref 3.7–5.3)
SODIUM: 132 meq/L — AB (ref 137–147)

## 2014-01-02 LAB — CBC
HEMATOCRIT: 32.1 % — AB (ref 36.0–46.0)
HEMOGLOBIN: 11 g/dL — AB (ref 12.0–15.0)
MCH: 31.6 pg (ref 26.0–34.0)
MCHC: 34.3 g/dL (ref 30.0–36.0)
MCV: 92.2 fL (ref 78.0–100.0)
Platelets: 164 10*3/uL (ref 150–400)
RBC: 3.48 MIL/uL — AB (ref 3.87–5.11)
RDW: 16.1 % — ABNORMAL HIGH (ref 11.5–15.5)
WBC: 7.3 10*3/uL (ref 4.0–10.5)

## 2014-01-02 NOTE — Progress Notes (Signed)
[name]   DOB:August 01, 78 1929   AJ#:[id number]   CNO#:[id number]  Subjective:   No acute overnight events.    Objective:  Filed Vitals:   01/02/14 0531  BP: 121/75  Pulse: 108  Temp: 98.5 F (36.9 C)  Resp: 18    Body mass index is 18.59 kg/(m^2).  Intake/Output Summary (Last 24 hours) at 01/02/14 1013 Last data filed at 01/02/14 0648  Gross per 24 hour  Intake   2005 ml  Output      0 ml  Net   2005 ml    Chronically ill appearing  Sclerae unicteric  Oropharynx clear  No peripheral adenopathy  Lungs clear -- no rales or rhonchi  Heart regular rate and rhythm  Abdomen benign  MSK  no peripheral edema  Neuro nonfocal    CBG (last 3)  No results found for this basename: GLUCAP,  in the last 72 hours   Labs:  Lab Results  Component Value Date   WBC 7.3 01/02/2014   HGB 11.0* 01/02/2014   HCT 32.1* 01/02/2014   MCV 92.2 01/02/2014   PLT 164 01/02/2014   NEUTROABS 4.1 2/94/7654    Basic Metabolic Panel:  Recent Labs Lab 12/31/13 1250 01/01/14 0432 01/02/14 0428  NA 132* 132* 132*  K 3.4* 3.5* 3.8  CL 91* 93* 95*  CO2 30 28 28   GLUCOSE 171* 156* 104*  BUN 10 10 10   CREATININE 0.50 0.44* 0.57  CALCIUM 8.2* 7.6* 7.9*  MG 1.7  --   --    GFR Estimated Creatinine Clearance: 37.5 ml/min (by C-G formula based on Cr of 0.57). Liver Function Tests:  Recent Labs Lab 12/31/13 1250  AST 35  ALT 37*  ALKPHOS 371*  BILITOT 0.4  PROT 4.9*  ALBUMIN 2.1*   CBC:  Recent Labs Lab 12/31/13 1250 01/01/14 0432 01/02/14 0428  WBC 7.0 6.5 7.3  NEUTROABS 4.1  --   --   HGB 12.1 10.8* 11.0*  HCT 35.3* 31.8* 32.1*  MCV 91.5 93.0 92.2  PLT 174 160 164   Hgb A1c  Recent Labs  12/31/13 1856  HGBA1C 7.7*   Lipid Profile No results found for this basename: CHOL, HDL, LDLCALC, TRIG, CHOLHDL, LDLDIRECT,  in the last 72 hours Thyroid function studies  Recent Labs  01/01/14 0430  TSH 24.330*   Anemia work up No results found for this basename:  VITAMINB12, FOLATE, FERRITIN, TIBC, IRON, RETICCTPCT,  in the last 72 hours Microbiology Recent Results (from the past 240 hour(s))  URINE CULTURE     Status: None   Collection Time    12/31/13  8:54 PM      Result Value Ref Range Status   Specimen Description URINE, RANDOM   Final   Special Requests NONE   Final   Culture  Setup Time     Final   Value: 01/01/2014 01:09     Performed at [facility]     Final   Value: NO GROWTH     Performed at [facility]   Culture     Final   Value: NO GROWTH     Performed at [facility]   Report Status 01/01/2014 FINAL   Final      Studies:  No results found.  Assessment/Plan: 80 y.o.  1. Probable Stage IV Pancreatic cancer --Scans demonstrate new liver lesions despite chemoradiation. Patient declines further chemotherapy.  Pallative care on board. Patient will be discharged with  home care and as she progresses, the son reports that they are aware of hospice service.    2. Code status. --Patient is DNR.   3. Dehydration. --Likely secondary to chemoradiation plus #1.  Continue intravenous hydration.  4. Poor nutrition. --Continue marinol as tolerated.  This can make her confused.  Close monitoring.  Nutrition consult.   5. Disposition. --DNR.  Ok to discharge back to home with home care and family members are aware.    Thanks for taking care of [name].   Concha Norway, MD 01/02/2014  10:13 AM

## 2014-01-02 NOTE — Evaluation (Signed)
Physical Therapy Evaluation Patient Details Name: [name] MRN: [id number] DOB: [birth year] Today's Date: 01/02/2014   History of Present Illness  80 yo female admitted 12/31/13 with dehydtration, weakness, FTT. H/O pancreatic cancer.  Clinical Impression  Pt is weak, able to mobilize to recliner. Pt will benefit from PT to address problems listed in note. Plans for pt to return home. Pt will require 24/7 caregivers.CSacrum noted to bed reddened. RN aware. Allevyn placed. May need Allevyn on heels.    Follow Up Recommendations Home health PT;Supervision/Assistance - 24 hour    Equipment Recommendations  None recommended by PT    Recommendations for Other Services       Precautions / Restrictions Precautions Precautions: Fall Restrictions Weight Bearing Restrictions: No      Mobility  Bed Mobility Overal bed mobility: Needs Assistance Bed Mobility: Supine to Sit     Supine to sit: Mod assist     General bed mobility comments: tactile cues to initiate activity and to continue, assist for legs to edge and trunk  into upright,  Transfers Overall transfer level: Needs assistance Equipment used: Rolling walker (2 wheeled) Transfers: Stand Pivot Transfers;Squat Pivot Transfers   Stand pivot transfers: +2 safety/equipment;Mod assist Squat pivot transfers: Mod assist;+2 safety/equipment     General transfer comment: pt is very weak, decreased stepping , cues for posture.  Ambulation/Gait                Stairs            Wheelchair Mobility    Modified Rankin (Stroke Patients Only)       Balance Overall balance assessment: Needs assistance Sitting-balance support: Bilateral upper extremity supported;Feet supported Sitting balance-Leahy Scale: Poor     Standing balance support: Bilateral upper extremity supported;During functional activity Standing balance-Leahy Scale: Poor                               Pertinent Vitals/Pain No  c/o    Home Living Family/patient expects to be discharged to:: Private residence Living Arrangements: Alone Available Help at Discharge: Available PRN/intermittently;Family Type of Home: House Home Access: Level entry     Home Layout: One level Home Equipment: Bedside commode;Walker - 2 wheels Additional Comments: pt states family will be with her. No family present.    Prior Function                 Hand Dominance        Extremity/Trunk Assessment   Upper Extremity Assessment: Generalized weakness           Lower Extremity Assessment: Generalized weakness      Cervical / Trunk Assessment: Kyphotic  Communication      Cognition Arousal/Alertness: Awake/alert Behavior During Therapy: Flat affect Overall Cognitive Status: Difficult to assess Area of Impairment: Orientation Orientation Level: Place;Time;Situation                  General Comments      Exercises        Assessment/Plan    PT Assessment Patient needs continued PT services  PT Diagnosis Difficulty walking;Generalized weakness   PT Problem List Decreased strength;Decreased activity tolerance;Decreased balance;Decreased mobility;Decreased safety awareness  PT Treatment Interventions DME instruction;Gait training;Functional mobility training;Therapeutic activities;Therapeutic exercise;Patient/family education   PT Goals (Current goals can be found in the Care Plan section) Acute Rehab PT Goals Patient Stated Goal: pt did not state PT Goal Formulation: With patient Time  For Goal Achievement: 01/16/14 Potential to Achieve Goals: Good    Frequency Min 3X/week   Barriers to discharge        Co-evaluation               End of Session Equipment Utilized During Treatment: Gait belt Activity Tolerance: Patient limited by fatigue Patient left: in chair;with call bell/phone within reach;with chair alarm set Nurse Communication: Mobility status         Time:  0737-1062 PT Time Calculation (min): 18 min   Charges:   PT Evaluation $Initial PT Evaluation Tier I: 1 Procedure PT Treatments $Therapeutic Activity: 8-22 mins   PT G Codes:          Claretha Cooper 01/02/2014, 11:24 AM Tresa Endo PT [phone]

## 2014-01-02 NOTE — Progress Notes (Signed)
Clinical Social Work Department BRIEF PSYCHOSOCIAL ASSESSMENT 01/02/2014  Patient:  [name], [name]     Account Number:  [id number]     Admit date:  12/31/2013  Clinical Social Worker:  Earlie Server  Date/Time:  01/02/2014 10:15 AM  Referred by:  Physician  Date Referred:  01/02/2014 Referred for  SNF Placement   Other Referral:   Interview type:  Patient Other interview type:    PSYCHOSOCIAL DATA Living Status:  ALONE Admitted from facility:   Level of care:   Primary support name:  Herbie Baltimore Primary support relationship to patient:  CHILD, ADULT Degree of support available:   Strong    CURRENT CONCERNS Current Concerns  Post-Acute Placement   Other Concerns:    SOCIAL WORK ASSESSMENT / PLAN CSW spoke with NP from PMT who reports family and patient are undecided about home with [facility] or hospice vs SNF placement. CSW reviewed chart and met with patient at bedside. CSW introduced myself and explained role.    Patient reports that she lives home alone but that her family is very involved and cares for her. Patient reports that she has a Actuary during the day from 9am-4pm and then family spends the night with her. CSW provided SNF list and explained options. Patient reports that she would never want to go to SNF and wants to go back home. Patient reports as long as family is staying with her then she is safe and will feel more comfortable at home. CSW received permission to contact son and discuss DC plans as well.    Per chart review, patient designated son and dtr as HCPOA. Patient requested that CSW contact son Grant [place] Chip) so CSW spoke with son via phone. Son is aware that patient is not interested in SNF so reports he has worked out to have additional help starting Monday. Son reports that he and sister will continue to spend the night with patient and that patient is already active with [facility]. Son wants to continue [facility] and had questions about equipment at home.  CSW explained that CSW specializes in SNF placement but that information would be shared with CM. Son reports no further needs at this time and feels comfortable with patient returning home.    CSW left contact information with patient and family and informed CM of possible HH/equipment needs. CSW is signing off but available if further needs arise.   Assessment/plan status:  No Further Intervention Required Other assessment/ plan:   Information/referral to community resources:   SNF information    PATIENT'S/FAMILY'S RESPONSE TO PLAN OF CARE: Patient alert and oriented but has a flat affect. Patient aware of her options at DC but is adamant that she only wants to return home. Patient reports that family will set up plans and that son is involved with care. Family is aware of patient's desires and want to respect her wishes. Family agreeable to [facility] but does not want SNF at this time.       [place], [place] [phone]

## 2014-01-02 NOTE — Progress Notes (Signed)
  Visited patient at bedside for f/u after yesterdays [place] meeting.  Patient verbalizes no questions or concerns today.  She tells me plan is for home with home health services "for now", and is comfortable with that plan.  Left phone message for her son and daughter in law for support.  Encouraged to call with questions or concerns.  Wadie Lessen NP  Palliative Medicine Team Team Phone # [phone] Pager [phone]  Discussed with Sindy Messing LCSW

## 2014-01-02 NOTE — Evaluation (Signed)
Clinical/Bedside Swallow Evaluation Patient Details  Name: [name] MRN: [id number] Date of Birth: October 09, 1927  Today's Date: 01/02/2014 Time: 5400-8676 SLP Time Calculation (min): 18 min  Past Medical History:  Past Medical History  Diagnosis Date  . Arthritis     Left knee, s/p cortisone injection  . GERD (gastroesophageal reflux disease)   . Complication of anesthesia     Very hard to wake up after anesthesia  . Dizziness   . Seasonal allergies   . Frequency of urination   . Dysrhythmia     Sinus tachycardia can get up to 108. Started after chemo   . Hypertension   . Hyperlipidemia   . Thyroid disease   . Hypothyroidism   . Breast cancer 2001    S/P mastectomy, XRT and chemo  . Cancer 10/07/13    pancreatic cancer/adenocarcinoma  . Diabetes mellitus without complication     metformin   Past Surgical History:  Past Surgical History  Procedure Laterality Date  . Joint replacement      right knee  . Carpal tunnel release      bilateral  . Incontinence surgery    . Cataract extraction      left  . Mastectomy      right due to cancer  . Ercp  10/06/2011    Procedure: ENDOSCOPIC RETROGRADE CHOLANGIOPANCREATOGRAPHY (ERCP);  Surgeon: Gatha Mayer, MD;  Location: [facility];  Service: Endoscopy;  Laterality: N/A;  or general per anesthesia  . Sphincterotomy  10/06/2011    Procedure: SPHINCTEROTOMY;  Surgeon: Gatha Mayer, MD;  Location: [facility];  Service: Endoscopy;;  . Cholecystectomy  10/07/2011    Procedure: LAPAROSCOPIC CHOLECYSTECTOMY WITH INTRAOPERATIVE CHOLANGIOGRAM;  Surgeon: Edward Jolly, MD;  Location: [facility];  Service: General;  Laterality: N/A;  . Tonsillectomy    . Colonoscopy    . Eye surgery    . Lumbar laminectomy/decompression microdiscectomy  05/27/2012    Procedure: LUMBAR LAMINECTOMY/DECOMPRESSION MICRODISCECTOMY 1 LEVEL;  Surgeon: Ophelia Charter, MD;  Location: [facility];  Service: Neurosurgery;  Laterality: Bilateral;  Lumbar  four laminectomy, with bilateral lumbar three laminotomies.  . Ercp N/A 10/04/2013    Procedure: ENDOSCOPIC RETROGRADE CHOLANGIOPANCREATOGRAPHY (ERCP);  Surgeon: Beryle Beams, MD;  Location: [facility];  Service: Gastroenterology;  Laterality: N/A;  . Eus N/A 10/07/2013    Procedure: UPPER ENDOSCOPIC ULTRASOUND (EUS) LINEAR;  Surgeon: Beryle Beams, MD;  Location: [facility];  Service: Endoscopy;  Laterality: N/A;   HPI:  80 yo adm to [facility] with dehydration, FTT, pancreatic cancer s/p recent chemo tx - Pt noted to have thrush recently and was treated.  Bedside swallow evaluation ordered.    Assessment / Plan / Recommendation Clinical Impression  Pt without clinical indications of significant oropharyngeal dysphagia - swallow was timely with clear voice throughout. Pt denies pain with swallowing associated with thrush.  She does admit to occasional difficulties swallowing large pills - stating it's hard for them to go down. SLP educated pt/sigificant other to alternative ways to take medications.  Largest factor for poor intake per pt is due to nausea and poor appetite.       Aspiration Risk  Mild    Diet Recommendation Regular;Thin liquid   Liquid Administration via: Cup;Straw Medication Administration:  (as tolerated) Supervision: Patient able to self feed Compensations: Slow rate;Small sips/bites Postural Changes and/or Swallow Maneuvers: Seated upright 90 degrees;Upright 30-60 min after meal    Other  Recommendations Oral Care Recommendations: Patient independent with  oral care   Follow Up Recommendations  None    Frequency and Duration   n/a     Pertinent Vitals/Pain Afebrile, decreased     Swallow Study Prior Functional Status   pt denies dysphagia    General Date of Onset: 01/02/14 HPI: 80 yo adm to [facility] with dehydration, FTT, pancreatic cancer s/p recent chemo tx - Pt noted to have thrush recently and was treated.  Bedside swallow evaluation ordered.  Type of Study: Bedside  swallow evaluation Diet Prior to this Study: Regular;Thin liquids Temperature Spikes Noted: Yes Respiratory Status: Room air History of Recent Intubation: No Behavior/Cognition: Cooperative;Pleasant mood;Alert Oral Cavity - Dentition: Adequate natural dentition Self-Feeding Abilities: Able to feed self Patient Positioning: Upright in bed Baseline Vocal Quality: Clear Volitional Swallow: Able to elicit    Oral/Motor/Sensory Function Overall Oral Motor/Sensory Function: Appears within functional limits for tasks assessed   Ice Chips Ice chips: Not tested   Thin Liquid Thin Liquid: Within functional limits Presentation: Self Fed;Straw    Nectar Thick Nectar Thick Liquid: Not tested   Honey Thick Honey Thick Liquid: Not tested   Puree Puree: Within functional limits Presentation: Self Fed;Spoon   Solid   GO    Solid: Not tested       Luanna Salk, [place] [facility] SLP [phone]

## 2014-01-02 NOTE — Progress Notes (Signed)
OT Cancellation Note  Patient Details Name: [name] MRN: [id number] DOB: 1927-11-20   Cancelled Treatment:    Reason Eval/Treat Not Completed: Other (comment)  Family requests that OT come back tomorrow in the morning:  She has more energy in the morning.  Will check back on Saturday, if schedule permits.    Lesle Chris 01/02/2014, 12:56 PM Lesle Chris, OTR/L [phone] 01/02/2014

## 2014-01-02 NOTE — Progress Notes (Signed)
Patient ID: [name], female   DOB: 30-Apr-1928, 80 y.o.   MRN: [id number] [facility] PROGRESS NOTE  [name] WGY:[id number] DOB: Jan 26, 1928 DOA: 12/31/2013 PCP: [name], MD  Brief narrative:  Pt is 80 yo female with pancreatic cancer, follows with Dr. [name], presented to [facility] ED with main concern of progressive failure to thrive, poor oral intake, generalized weakness. Pt explains she underwent chemotherapy few weeks ago and was getting progressively weaker and unable to eat much. She also reports intermittent upper quadrants abdominal pain, throbbing, 5/10 in severity, non radiating, no specific alleviating or aggravating factors. She denies fevers, chills, no chest pain or shortness of breath, no specific focal neurological symptoms.   In ED, pt is hemodynamically stable, physical exam notable for clinical dehydration. TRH asked to admit for further evaluation.   Assessment and Plan:  Active Problems:  Generalized weakness with FTT  - most likely secondary to progressive nature of pancreatic cancer  - pt reports feeling better this AM but still too tired to participate in PT  - continue to provide IVF, analgesia, antiemetics as needed  - will need nutrition consultation, PT/OT once able to participate  - tolerating regular diet well  Dysphagia  - oral thrush noted  - continue Fluconazole IV day #3 Pancreatic cancer  - appreciate Dr. [name] input  HTN  - BP soft on admission so will continue to hold antihypertensive regimen form home which includes HCTZ and metoprolol  Hypothyroidism  - continue synthroid  Diabetes mellitus type II  - will hold metformin until PO intake improves  Hypokalemia  - supplemented and WNL this AM Hyponatremia  - secondary to pre renal etiology  - sodium remains stable at 132 Severe malnutrition  - secondary to progressive nature of the illness  - nutrition consultation appreciated   Consultants:  Oncology   Procedures/Studies:  None  Antibiotics:  None   Code Status: DNR  Family Communication: Pt at bedside  Disposition Plan: remains inpatient   HPI/Subjective: No events overnight.   Objective: Filed Vitals:   01/01/14 0615 01/01/14 1409 01/01/14 2137 01/02/14 0531  BP: 137/75 113/78 102/66 121/75  Pulse: 94 117 113 108  Temp: 98 F (36.7 C) 97.4 F (36.3 C) 97.8 F (36.6 C) 98.5 F (36.9 C)  TempSrc: Oral Oral Oral Oral  Resp: 18 18 18 18   Height:      Weight:      SpO2: 94% 97% 95% 94%    Intake/Output Summary (Last 24 hours) at 01/02/14 1015 Last data filed at 01/02/14 7793  Gross per 24 hour  Intake   2005 ml  Output      0 ml  Net   2005 ml    Exam:   General:  Pt is alert, follows commands appropriately, not in acute distress, cachectic   Cardiovascular: Regular rate and rhythm, SEM 2/6,  no rubs, no gallops  Respiratory: Clear to auscultation bilaterally, no wheezing, no crackles, no rhonchi  Abdomen: Soft, non tender, non distended, bowel sounds present, no guarding   Data Reviewed: Basic Metabolic Panel:  Recent Labs Lab 12/31/13 1250 01/01/14 0432 01/02/14 0428  NA 132* 132* 132*  K 3.4* 3.5* 3.8  CL 91* 93* 95*  CO2 30 28 28   GLUCOSE 171* 156* 104*  BUN 10 10 10   CREATININE 0.50 0.44* 0.57  CALCIUM 8.2* 7.6* 7.9*  MG 1.7  --   --    Liver Function Tests:  Recent Labs Lab 12/31/13 1250  AST 35  ALT 37*  ALKPHOS 371*  BILITOT 0.4  PROT 4.9*  ALBUMIN 2.1*   CBC:  Recent Labs Lab 12/31/13 1250 01/01/14 0432 01/02/14 0428  WBC 7.0 6.5 7.3  NEUTROABS 4.1  --   --   HGB 12.1 10.8* 11.0*  HCT 35.3* 31.8* 32.1*  MCV 91.5 93.0 92.2  PLT 174 160 164   Recent Results (from the past 240 hour(s))  URINE CULTURE     Status: None   Collection Time    12/31/13  8:54 PM      Result Value Ref Range Status   Specimen Description URINE, RANDOM   Final   Special Requests NONE   Final   Culture  Setup Time     Final   Value:  01/01/2014 01:09     Performed at [place]     Final   Value: NO GROWTH     Performed at [facility]   Culture     Final   Value: NO GROWTH     Performed at [facility]   Report Status 01/01/2014 FINAL   Final     Scheduled Meds: . dronabinol  5 mg Oral BID AC  . enoxaparin (LOVENOX) injection  30 mg Subcutaneous Q24H  . feeding supplement (GLUCERNA SHAKE)  237 mL Oral BID BM  . fluconazole (DIFLUCAN) IV  100 mg Intravenous Q24H  . hyaluronate sodium  1 application Topical BID  . levothyroxine  100 mcg Oral Once per day on Mon Wed Fri  . levothyroxine  50 mcg Oral Once per day on Sun Tue Thu Sat  . pantoprazole  40 mg Oral Daily  . sucralfate  1 g Oral BID   Continuous Infusions: . sodium chloride 75 mL/hr at 12/31/13 1745     Theodis Blaze, MD  [facility] Pager [phone]  If 7PM-7AM, please contact night-coverage [email or web address] Password [email or web address] 01/02/2014, 10:15 AM   LOS: 2 days

## 2014-01-03 LAB — GLUCOSE, CAPILLARY: Glucose-Capillary: 154 mg/dL — ABNORMAL HIGH (ref 70–99)

## 2014-01-03 MED ORDER — INSULIN ASPART 100 UNIT/ML ~~LOC~~ SOLN
0.0000 [IU] | Freq: Three times a day (TID) | SUBCUTANEOUS | Status: DC
  Administered 2014-01-03: 2 [IU] via SUBCUTANEOUS
  Administered 2014-01-04: 1 [IU] via SUBCUTANEOUS

## 2014-01-03 MED ORDER — METFORMIN HCL ER 500 MG PO TB24
1000.0000 mg | ORAL_TABLET | Freq: Two times a day (BID) | ORAL | Status: DC
  Administered 2014-01-03 – 2014-01-05 (×4): 1000 mg via ORAL
  Filled 2014-01-03 (×7): qty 2

## 2014-01-03 MED ORDER — DRONABINOL 2.5 MG PO CAPS
2.5000 mg | ORAL_CAPSULE | Freq: Two times a day (BID) | ORAL | Status: DC
  Administered 2014-01-03 – 2014-01-05 (×4): 2.5 mg via ORAL
  Filled 2014-01-03 (×4): qty 1

## 2014-01-03 MED ORDER — MAGNESIUM HYDROXIDE 400 MG/5ML PO SUSP
15.0000 mL | Freq: Every day | ORAL | Status: DC | PRN
  Administered 2014-01-04: 15 mL via ORAL
  Filled 2014-01-03: qty 30

## 2014-01-03 MED ORDER — METOPROLOL TARTRATE 1 MG/ML IV SOLN
5.0000 mg | Freq: Four times a day (QID) | INTRAVENOUS | Status: DC | PRN
  Filled 2014-01-03: qty 5

## 2014-01-03 NOTE — Progress Notes (Signed)
OT Cancellation Note  Patient Details Name: [name] MRN: [id number] DOB: October 11, 1927   Cancelled Treatment:    Reason Eval/Treat Not Completed: Other (comment). Attempted to see pt a second time and she had just gotten back to bed at 1:30. Will re-attempt at a later date.  Almon Register [phone] 01/03/2014, 1:59 PM

## 2014-01-03 NOTE — Progress Notes (Signed)
Patient tolerating diet well and has eaten 75-100% of each meal today. She is asking about CBG's and Metformin. Dr. Doyle Askew paged.

## 2014-01-03 NOTE — Progress Notes (Signed)
Patient ID: [name], female   DOB: January 15, 1928, 80 y.o.   MRN: [id number]  [facility] PROGRESS NOTE  VASILIA DISE RWE:[id number] DOB: 02-24-1928 DOA: 12/31/2013 PCP: [name], MD  Brief narrative:  Pt is 80 yo female with pancreatic cancer, follows with Dr. [name], presented to [facility] ED with main concern of progressive failure to thrive, poor oral intake, generalized weakness. Pt explains she underwent chemotherapy few weeks ago and was getting progressively weaker and unable to eat much. She also reports intermittent upper quadrants abdominal pain, throbbing, 5/10 in severity, non radiating, no specific alleviating or aggravating factors. She denies fevers, chills, no chest pain or shortness of breath, no specific focal neurological symptoms.   In ED, pt is hemodynamically stable, physical exam notable for clinical dehydration. [facility] asked to admit for further evaluation.   Assessment and Plan:  Active Problems:  Generalized weakness with FTT  - most likely secondary to progressive nature of pancreatic cancer  - pt reports feeling better this AM but still too tired to participate in PT  - continue to provide IVF and increase to 75 cc/hr due to tachycardia, continue analgesia, antiemetics as needed  - PT/OT once able to participate  - tolerating regular diet well  Dysphagia  - oral thrush noted  - continue Fluconazole IV day #4 Pancreatic cancer  - appreciate Dr. [name] input  HTN  - BP soft on admission so will continue to hold antihypertensive regimen form home which includes HCTZ and metoprolol  Hypothyroidism  - continue synthroid  Diabetes mellitus type II  - will hold metformin until PO intake improves  Hypokalemia  - supplemented and WNL this AM  Hyponatremia  - secondary to pre renal etiology  - sodium remains stable at 132  Severe malnutrition  - secondary to progressive nature of the illness  - nutrition consultation appreciated   Consultants:   Oncology  Procedures/Studies:  None  Antibiotics:  None   Code Status: DNR  Family Communication: Pt at bedside  Disposition Plan: remains inpatient   HPI/Subjective: No events overnight.   Objective: Filed Vitals:   01/02/14 0531 01/02/14 1445 01/02/14 2137 01/03/14 0632  BP: 121/75 103/67 115/76 128/74  Pulse: 108 112 118 121  Temp: 98.5 F (36.9 C) 98.1 F (36.7 C) 98.1 F (36.7 C) 98.6 F (37 C)  TempSrc: Oral Oral Oral Oral  Resp: 18 16 16 16   Height:      Weight:      SpO2: 94% 97% 95% 94%    Intake/Output Summary (Last 24 hours) at 01/03/14 1148 Last data filed at 01/03/14 0900  Gross per 24 hour  Intake 1086.5 ml  Output      0 ml  Net 1086.5 ml    Exam:   General:  Pt is alert, follows commands appropriately, not in acute distress, cachectic   Cardiovascular: Regular rate and rhythm, S1/S2, no murmurs, no rubs, no gallops  Respiratory: Clear to auscultation bilaterally, no wheezing, no crackles, no rhonchi  Abdomen: Soft, non tender, non distended, bowel sounds present, no guarding  Data Reviewed: Basic Metabolic Panel:  Recent Labs Lab 12/31/13 1250 01/01/14 0432 01/02/14 0428  NA 132* 132* 132*  K 3.4* 3.5* 3.8  CL 91* 93* 95*  CO2 30 28 28   GLUCOSE 171* 156* 104*  BUN 10 10 10   CREATININE 0.50 0.44* 0.57  CALCIUM 8.2* 7.6* 7.9*  MG 1.7  --   --    Liver Function Tests:  Recent Labs  Lab 12/31/13 1250  AST 35  ALT 37*  ALKPHOS 371*  BILITOT 0.4  PROT 4.9*  ALBUMIN 2.1*   CBC:  Recent Labs Lab 12/31/13 1250 01/01/14 0432 01/02/14 0428  WBC 7.0 6.5 7.3  NEUTROABS 4.1  --   --   HGB 12.1 10.8* 11.0*  HCT 35.3* 31.8* 32.1*  MCV 91.5 93.0 92.2  PLT 174 160 164     Recent Results (from the past 240 hour(s))  URINE CULTURE     Status: None   Collection Time    12/31/13  8:54 PM      Result Value Ref Range Status   Specimen Description URINE, RANDOM   Final   Special Requests NONE   Final   Culture  Setup Time      Final   Value: 01/01/2014 01:09     Performed at [place]     Final   Value: NO GROWTH     Performed at [facility]   Culture     Final   Value: NO GROWTH     Performed at [facility]   Report Status 01/01/2014 FINAL   Final     Scheduled Meds: . dronabinol  5 mg Oral BID AC  . enoxaparin (LOVENOX) injection  30 mg Subcutaneous Q24H  . feeding supplement (GLUCERNA SHAKE)  237 mL Oral BID BM  . fluconazole (DIFLUCAN) IV  100 mg Intravenous Q24H  . hyaluronate sodium  1 application Topical BID  . levothyroxine  100 mcg Oral Once per day on Mon Wed Fri  . levothyroxine  50 mcg Oral Once per day on Sun Tue Thu Sat  . pantoprazole  40 mg Oral Daily  . sucralfate  1 g Oral BID   Continuous Infusions: . sodium chloride 30 mL/hr at 01/03/14 0318     Theodis Blaze, MD  [facility] Pager [phone]  If 7PM-7AM, please contact night-coverage [email or web address] Password [email or web address] 01/03/2014, 11:48 AM   LOS: 3 days

## 2014-01-03 NOTE — Progress Notes (Signed)
OT Cancellation Note  Patient Details Name: [name] MRN: [id number] DOB: [birth year]   Cancelled Treatment:    Reason Eval/Treat Not Completed: Other (comment). Pt currently eating breakfast, will try back later as schedule allows.  Almon Register [phone] 01/03/2014, 8:12 AM

## 2014-01-04 LAB — BASIC METABOLIC PANEL
BUN: 9 mg/dL (ref 6–23)
BUN: 11 mg/dL (ref 6–23)
CALCIUM: 5.9 mg/dL — AB (ref 8.4–10.5)
CALCIUM: 7.9 mg/dL — AB (ref 8.4–10.5)
CHLORIDE: 98 meq/L (ref 96–112)
CO2: 20 mEq/L (ref 19–32)
CO2: 22 meq/L (ref 19–32)
CREATININE: 0.36 mg/dL — AB (ref 0.50–1.10)
CREATININE: 0.41 mg/dL — AB (ref 0.50–1.10)
Chloride: 108 mEq/L (ref 96–112)
GFR calc Af Amer: >90 mL/min (ref >90)
GFR calc Af Amer: >90 mL/min (ref >90)
GFR calc non Af Amer: >90 mL/min (ref >90)
GLUCOSE: 117 mg/dL — AB (ref 70–99)
Glucose, Bld: 77 mg/dL (ref 70–99)
Potassium: 2.3 mEq/L — CL (ref 3.7–5.3)
Potassium: 3.6 mEq/L — ABNORMAL LOW (ref 3.7–5.3)
SODIUM: 139 meq/L (ref 137–147)
Sodium: 132 mEq/L — ABNORMAL LOW (ref 137–147)

## 2014-01-04 LAB — CBC
HEMATOCRIT: 24.6 % — AB (ref 36.0–46.0)
Hemoglobin: 8.5 g/dL — ABNORMAL LOW (ref 12.0–15.0)
MCH: 32.2 pg (ref 26.0–34.0)
MCHC: 34.6 g/dL (ref 30.0–36.0)
MCV: 93.2 fL (ref 78.0–100.0)
Platelets: 131 10*3/uL — ABNORMAL LOW (ref 150–400)
RBC: 2.64 MIL/uL — AB (ref 3.87–5.11)
RDW: 16.4 % — ABNORMAL HIGH (ref 11.5–15.5)
WBC: 5.5 10*3/uL (ref 4.0–10.5)

## 2014-01-04 LAB — MAGNESIUM: Magnesium: 1.4 mg/dL — ABNORMAL LOW (ref 1.5–2.5)

## 2014-01-04 LAB — GLUCOSE, CAPILLARY
GLUCOSE-CAPILLARY: 96 mg/dL (ref 70–99)
GLUCOSE-CAPILLARY: 128 mg/dL — AB (ref 70–99)
Glucose-Capillary: 97 mg/dL (ref 70–99)

## 2014-01-04 MED ORDER — POTASSIUM CHLORIDE CRYS ER 20 MEQ PO TBCR
30.0000 meq | EXTENDED_RELEASE_TABLET | ORAL | Status: AC
  Administered 2014-01-04 (×2): 30 meq via ORAL
  Filled 2014-01-04 (×3): qty 1

## 2014-01-04 MED ORDER — SODIUM CHLORIDE 0.9 % IV SOLN
1.0000 g | Freq: Once | INTRAVENOUS | Status: AC
  Administered 2014-01-04: 1 g via INTRAVENOUS
  Filled 2014-01-04: qty 10

## 2014-01-04 MED ORDER — POTASSIUM CHLORIDE CRYS ER 20 MEQ PO TBCR
40.0000 meq | EXTENDED_RELEASE_TABLET | Freq: Once | ORAL | Status: AC
  Administered 2014-01-04: 40 meq via ORAL
  Filled 2014-01-04: qty 2

## 2014-01-04 NOTE — Progress Notes (Signed)
Paged Walden Field NP about thre Pt's am labs. K 2.3 and Cal 5.9. No orders given at this time.Will monitor.  Basem Yannuzzi Mahario Sandra Tellefsen 6:46 AM.td

## 2014-01-04 NOTE — Progress Notes (Addendum)
Patient ID: [name], female   DOB: 1927/09/29, 80 y.o.   MRN: [id number]  [facility] PROGRESS NOTE  [name] GBT:[id number] DOB: 1927/09/10 DOA: 12/31/2013 PCP: [name], MD  Brief narrative:  Pt is 80 yo female with pancreatic cancer, follows with Dr. [name], presented to [facility] ED with main concern of progressive failure to thrive, poor oral intake, generalized weakness. Pt explains she underwent chemotherapy few weeks ago and was getting progressively weaker and unable to eat much. She also reports intermittent upper quadrants abdominal pain, throbbing, 5/10 in severity, non radiating, no specific alleviating or aggravating factors. She denies fevers, chills, no chest pain or shortness of breath, no specific focal neurological symptoms.   In ED, pt is hemodynamically stable, physical exam notable for clinical dehydration. [facility] asked to admit for further evaluation.   Assessment and Plan:  Active Problems:  Generalized weakness with FTT  - most likely secondary to progressive nature of pancreatic cancer  - pt reports feeling better this AM but still too tired to participate in PT  - continue to provide analgesia, antiemetics as needed  - PT/OT encouraged  - tolerating regular diet well  Dysphagia  - oral thrush noted  - continue Fluconazole IV day #5/7 Pancreatic cancer  - appreciate Dr. [name] input  Acute on chronic blood loss anemia - drop in Hg since admission and possibly dilutional from IVF - no sings of active bleeding, repeat CBC in AM  HTN  - BP soft on admission so will continue to hold antihypertensive regimen form home which includes HCTZ and metoprolol  Hypothyroidism   - continue synthroid  Diabetes mellitus type II  - start metformin  Hypokalemia  - check Mg and supplement potassium via oral route if possible  Hyponatremia  - secondary to pre renal etiology  - sodium now WNL  Severe malnutrition  - secondary to progressive nature of  the illness  - nutrition consultation appreciated   Consultants:  Oncology  Procedures/Studies:  None  Antibiotics:  None   Code Status: DNR  Family Communication: Pt at bedside  Disposition Plan: remains inpatient   HPI/Subjective: No events overnight.   Objective: Filed Vitals:   01/03/14 7106 01/03/14 1524 01/03/14 2103 01/04/14 0532  BP: 128/74 116/74 116/79 100/64  Pulse: 121 106 117 111  Temp: 98.6 F (37 C) 97.8 F (36.6 C) 98.1 F (36.7 C) 98.6 F (37 C)  TempSrc: Oral Oral Oral Oral  Resp: 16 16 16 16   Height:      Weight:      SpO2: 94% 94% 92% 93%    Intake/Output Summary (Last 24 hours) at 01/04/14 1215 Last data filed at 01/04/14 0600  Gross per 24 hour  Intake 1362.75 ml  Output      0 ml  Net 1362.75 ml    Exam:   General:  Pt is alert, follows commands appropriately, not in acute distress  Cardiovascular: Regular rhythm, tachycardic, S1/S2, no murmurs, no rubs, no gallops  Respiratory: Clear to auscultation bilaterally, no wheezing, no crackles, no rhonchi  Abdomen: Soft, non tender, non distended, bowel sounds present, no guarding  Extremities: No edema, pulses DP and PT palpable bilaterally  Data Reviewed: Basic Metabolic Panel:  Recent Labs Lab 12/31/13 1250 01/01/14 0432 01/02/14 0428 01/04/14 0528  NA 132* 132* 132* 139  K 3.4* 3.5* 3.8 2.3*  CL 91* 93* 95* 108  CO2 30 28 28 20   GLUCOSE 171* 156* 104* 77  BUN 10 10  10 9  CREATININE 0.50 0.44* 0.57 0.36*  CALCIUM 8.2* 7.6* 7.9* 5.9*  MG 1.7  --   --   --    Liver Function Tests:  Recent Labs Lab 12/31/13 1250  AST 35  ALT 37*  ALKPHOS 371*  BILITOT 0.4  PROT 4.9*  ALBUMIN 2.1*   CBC:  Recent Labs Lab 12/31/13 1250 01/01/14 0432 01/02/14 0428 01/04/14 0528  WBC 7.0 6.5 7.3 5.5  NEUTROABS 4.1  --   --   --   HGB 12.1 10.8* 11.0* 8.5*  HCT 35.3* 31.8* 32.1* 24.6*  MCV 91.5 93.0 92.2 93.2  PLT 174 160 164 131*   CBG:  Recent Labs Lab 01/03/14 1803  01/04/14 0825  GLUCAP 154* 96    Recent Results (from the past 240 hour(s))  URINE CULTURE     Status: None   Collection Time    12/31/13  8:54 PM      Result Value Ref Range Status   Specimen Description URINE, RANDOM   Final   Special Requests NONE   Final   Culture  Setup Time     Final   Value: 01/01/2014 01:09     Performed at [place]     Final   Value: NO GROWTH     Performed at [facility]   Culture     Final   Value: NO GROWTH     Performed at [facility]   Report Status 01/01/2014 FINAL   Final     Scheduled Meds: . dronabinol  2.5 mg Oral BID AC  . enoxaparin (LOVENOX) injection  30 mg Subcutaneous Q24H  . feeding supplement (GLUCERNA SHAKE)  237 mL Oral BID BM  . fluconazole (DIFLUCAN) IV  100 mg Intravenous Q24H  . hyaluronate sodium  1 application Topical BID  . insulin aspart  0-9 Units Subcutaneous TID WC  . levothyroxine  100 mcg Oral Once per day on Mon Wed Fri  . levothyroxine  50 mcg Oral Once per day on Sun Tue Thu Sat  . metFORMIN  1,000 mg Oral BID  . pantoprazole  40 mg Oral Daily  . sucralfate  1 g Oral BID   Continuous Infusions: . sodium chloride 75 mL/hr at 01/03/14 1151     Theodis Blaze, MD  [facility] Pager [phone]  If 7PM-7AM, please contact night-coverage [email or web address] Password [email or web address] 01/04/2014, 12:15 PM   LOS: 4 days

## 2014-01-05 ENCOUNTER — Telehealth: Payer: Self-pay | Admitting: *Deleted

## 2014-01-05 LAB — CBC
HCT: 31.5 % — ABNORMAL LOW (ref 36.0–46.0)
Hemoglobin: 10.8 g/dL — ABNORMAL LOW (ref 12.0–15.0)
MCH: 32.3 pg (ref 26.0–34.0)
MCHC: 34.3 g/dL (ref 30.0–36.0)
MCV: 94.3 fL (ref 78.0–100.0)
PLATELETS: 167 10*3/uL (ref 150–400)
RBC: 3.34 MIL/uL — AB (ref 3.87–5.11)
RDW: 17.1 % — ABNORMAL HIGH (ref 11.5–15.5)
WBC: 7.5 10*3/uL (ref 4.0–10.5)

## 2014-01-05 LAB — BASIC METABOLIC PANEL
BUN: 13 mg/dL (ref 6–23)
CALCIUM: 8.1 mg/dL — AB (ref 8.4–10.5)
CHLORIDE: 97 meq/L (ref 96–112)
CO2: 25 mEq/L (ref 19–32)
CREATININE: 0.51 mg/dL (ref 0.50–1.10)
GFR, EST NON AFRICAN AMERICAN: 87 mL/min — AB (ref >90)
Glucose, Bld: 85 mg/dL (ref 70–99)
POTASSIUM: 4.5 meq/L (ref 3.7–5.3)
SODIUM: 131 meq/L — AB (ref 137–147)

## 2014-01-05 LAB — GLUCOSE, CAPILLARY
GLUCOSE-CAPILLARY: 85 mg/dL (ref 70–99)
Glucose-Capillary: 104 mg/dL — ABNORMAL HIGH (ref 70–99)

## 2014-01-05 MED ORDER — FLEET ENEMA 7-19 GM/118ML RE ENEM: 1.0000 | ENEMA | Freq: Every day | RECTAL | Status: DC | PRN

## 2014-01-05 MED ORDER — DRONABINOL 2.5 MG PO CAPS: 2.5000 mg | ORAL_CAPSULE | Freq: Two times a day (BID) | ORAL | Status: AC | PRN

## 2014-01-05 MED ORDER — ONDANSETRON HCL 8 MG PO TABS: 8.0000 mg | ORAL_TABLET | Freq: Three times a day (TID) | ORAL | Status: AC | PRN

## 2014-01-05 MED ORDER — HYDROCODONE-ACETAMINOPHEN 5-325 MG PO TABS: 1.0000 | ORAL_TABLET | Freq: Four times a day (QID) | ORAL | Status: AC | PRN

## 2014-01-05 MED ORDER — BISACODYL 10 MG RE SUPP
10.0000 mg | Freq: Every day | RECTAL | Status: DC | PRN
  Filled 2014-01-05: qty 1

## 2014-01-05 NOTE — Progress Notes (Signed)
[name]   DOB:Jan 06, 1928   NT#:[id number]   GQQ#:[id number]  Subjective:   No acute overnight events. Patient states that she feels to weak to participate in physical therapy.   Objective:  Filed Vitals:   01/05/14 1100  BP: 108/72  Pulse: 102  Temp: 97.4 F (36.3 C)  Resp: 16    Body mass index is 18.59 kg/(m^2).  Intake/Output Summary (Last 24 hours) at 01/05/14 1237 Last data filed at 01/05/14 0609  Gross per 24 hour  Intake    100 ml  Output      0 ml  Net    100 ml    Chronically ill appearing, sitting in chair  Sclerae unicteric  Oropharynx clear  No peripheral adenopathy  Lungs clear -- no rales or rhonchi  Heart tachycardi and rhythm  Abdomen benign  MSK  no peripheral edema  Neuro nonfocal    CBG (last 3)   Recent Labs  01/04/14 1117 01/04/14 1657 01/05/14 0756  GLUCAP 128* 97 85     Labs:  Lab Results  Component Value Date   WBC 7.5 01/05/2014   HGB 10.8* 01/05/2014   HCT 31.5* 01/05/2014   MCV 94.3 01/05/2014   PLT 167 01/05/2014   NEUTROABS 4.1 6/71/2458    Basic Metabolic Panel:  Recent Labs Lab 12/31/13 1250 01/01/14 0432 01/02/14 0428 01/04/14 0528 01/04/14 1252 01/05/14 0524  NA 132* 132* 132* 139 132* 131*  K 3.4* 3.5* 3.8 2.3* 3.6* 4.5  CL 91* 93* 95* 108 98 97  CO2 30 28 28 20 22 25   GLUCOSE 171* 156* 104* 77 117* 85  BUN 10 10 10 9 11 13   CREATININE 0.50 0.44* 0.57 0.36* 0.41* 0.51  CALCIUM 8.2* 7.6* 7.9* 5.9* 7.9* 8.1*  MG 1.7  --   --   --  1.4*  --    GFR Estimated Creatinine Clearance: 37.5 ml/min (by C-G formula based on Cr of 0.51). Liver Function Tests:  Recent Labs Lab 12/31/13 1250  AST 35  ALT 37*  ALKPHOS 371*  BILITOT 0.4  PROT 4.9*  ALBUMIN 2.1*   CBC:  Recent Labs Lab 12/31/13 1250 01/01/14 0432 01/02/14 0428 01/04/14 0528 01/05/14 0524  WBC 7.0 6.5 7.3 5.5 7.5  NEUTROABS 4.1  --   --   --   --   HGB 12.1 10.8* 11.0* 8.5* 10.8*  HCT 35.3* 31.8* 32.1* 24.6* 31.5*  MCV 91.5 93.0 92.2 93.2  94.3  PLT 174 160 164 131* 167   Hgb A1c No results found for this basename: HGBA1C,  in the last 72 hours Lipid Profile No results found for this basename: CHOL, HDL, LDLCALC, TRIG, CHOLHDL, LDLDIRECT,  in the last 72 hours Thyroid function studies No results found for this basename: TSH, T4TOTAL, FREET3, T3FREE, THYROIDAB,  in the last 72 hours Anemia work up No results found for this basename: VITAMINB12, FOLATE, FERRITIN, TIBC, IRON, RETICCTPCT,  in the last 72 hours Microbiology Recent Results (from the past 240 hour(s))  URINE CULTURE     Status: None   Collection Time    12/31/13  8:54 PM      Result Value Ref Range Status   Specimen Description URINE, RANDOM   Final   Special Requests NONE   Final   Culture  Setup Time     Final   Value: 01/01/2014 01:09     Performed at [place]     Final   Value:  NO GROWTH     Performed at [facility]   Culture     Final   Value: NO GROWTH     Performed at [facility]   Report Status 01/01/2014 FINAL   Final      Studies:  No results found.  Assessment/Plan: 80 y.o.  1. Probable Stage IV Pancreatic cancer --Scans demonstrate new liver lesions despite chemoradiation. Patient declines further chemotherapy.  Pallative care on board.  --[place] for discharge from oncology perspective.   2. Dehydration. --Likely secondary to chemoradiation plus #1.  Continue to encourage PO hydration.  3. Poor nutrition. --Continue marinol as tolerated.  This can make her confused.  Might consider remeron if marinol is not effective as they might help her mode as well.   4. Disposition. --DNR.  Ok to discharge back to home with home care and family members are aware.    Thanks for taking care of [name].   Concha Norway, MD 01/05/2014  12:37 PM

## 2014-01-05 NOTE — ED Provider Notes (Signed)
CSN: [id number]     Arrival date & time 12/31/13  1039 History   First MD Initiated Contact with Patient 12/31/13 1105     Chief Complaint  Patient presents with  . Dehydration  . Anorexia     (Consider location/radiation/quality/duration/timing/severity/associated sxs/prior Treatment) HPI  Pt is 80 yo female with pancreatic cancer, follows with Dr. [name], presented to [facility] ED with main concern of progressive failure to thrive, poor oral intake, generalized weakness. Pt explains she underwent chemotherapy few weeks ago and was getting progressively weaker and unable to eat much. She also reports intermittent upper quadrants abdominal pain, throbbing, 5/10 in severity, non radiating, no specific alleviating or aggravating factors. She denies fevers, chills, no chest pain or shortness of breath    Past Medical History  Diagnosis Date  . Arthritis     Left knee, s/p cortisone injection  . GERD (gastroesophageal reflux disease)   . Complication of anesthesia     Very hard to wake up after anesthesia  . Dizziness   . Seasonal allergies   . Frequency of urination   . Dysrhythmia     Sinus tachycardia can get up to 108. Started after chemo   . Hypertension   . Hyperlipidemia   . Thyroid disease   . Hypothyroidism   . Breast cancer 2001    S/P mastectomy, XRT and chemo  . Cancer 10/07/13    pancreatic cancer/adenocarcinoma  . Diabetes mellitus without complication     metformin   Past Surgical History  Procedure Laterality Date  . Joint replacement      right knee  . Carpal tunnel release      bilateral  . Incontinence surgery    . Cataract extraction      left  . Mastectomy      right due to cancer  . Ercp  10/06/2011    Procedure: ENDOSCOPIC RETROGRADE CHOLANGIOPANCREATOGRAPHY (ERCP);  Surgeon: Gatha Mayer, MD;  Location: [facility];  Service: Endoscopy;  Laterality: N/A;  or general per anesthesia  . Sphincterotomy  10/06/2011    Procedure: SPHINCTEROTOMY;  Surgeon: Gatha Mayer, MD;  Location: [facility];  Service: Endoscopy;;  . Cholecystectomy  10/07/2011    Procedure: LAPAROSCOPIC CHOLECYSTECTOMY WITH INTRAOPERATIVE CHOLANGIOGRAM;  Surgeon: Edward Jolly, MD;  Location: [facility];  Service: General;  Laterality: N/A;  . Tonsillectomy    . Colonoscopy    . Eye surgery    . Lumbar laminectomy/decompression microdiscectomy  05/27/2012    Procedure: LUMBAR LAMINECTOMY/DECOMPRESSION MICRODISCECTOMY 1 LEVEL;  Surgeon: Ophelia Charter, MD;  Location: [facility];  Service: Neurosurgery;  Laterality: Bilateral;  Lumbar four laminectomy, with bilateral lumbar three laminotomies.  . Ercp N/A 10/04/2013    Procedure: ENDOSCOPIC RETROGRADE CHOLANGIOPANCREATOGRAPHY (ERCP);  Surgeon: Beryle Beams, MD;  Location: [facility];  Service: Gastroenterology;  Laterality: N/A;  . Eus N/A 10/07/2013    Procedure: UPPER ENDOSCOPIC ULTRASOUND (EUS) LINEAR;  Surgeon: Beryle Beams, MD;  Location: [facility];  Service: Endoscopy;  Laterality: N/A;   Family History  Problem Relation Age of Onset  . Stroke Mother   . Breast cancer Mother     dx in her 64s  . Heart disease Mother   . Stroke Father   . Heart disease Father   . Heart attack Father   . Breast cancer Daughter 31  . Cerebral aneurysm Sister   . Hyperlipidemia Son   . Hypertension Son   . Prostate cancer Maternal Grandfather   .  Breast cancer Sister 60  . Breast cancer Other     niece dx in her 37s  . Lung cancer Other   . Cancer Other 20    great niece with cancer of the lining of the heart.   History  Substance Use Topics  . Smoking status: Former Smoker -- 0.25 packs/day for 5 years    Types: Cigarettes    Quit date: 10/01/1957  . Smokeless tobacco: Never Used     Comment: Quit 50 years ago.  . Alcohol Use: Yes     Comment: occasionally; socially; wine   OB History   Grav Para Term Preterm Abortions TAB SAB Ect Mult Living                 Review of Systems  All systems reviewed and  negative, other than as noted in HPI.    Allergies  Aspirin; Gemfibrozil; Nsaids; Pravastatin; and Pse hcl-dm hbr-guaifenesin tan  Home Medications   Prior to Admission medications   Medication Sig Start Date End Date Taking? Authorizing Provider  cetirizine (ZYRTEC) 10 MG tablet Take 10 mg by mouth daily.   Yes Historical Provider, MD  Cholecalciferol (VITAMIN D-3) 5000 UNITS TABS Take by mouth daily.    Yes Historical Provider, MD  dexlansoprazole (DEXILANT) 60 MG capsule Take 60 mg by mouth daily.   Yes Historical Provider, MD  estradiol (ESTRING) 2 MG vaginal ring Place 2 mg vaginally every 3 (three) months. follow package directions 10/27/13  Yes Vicie Mutters, PA-C  FLAXSEED, LINSEED, PO Take 1 capsule by mouth daily.   Yes Historical Provider, MD  hyaluronate sodium (RADIAPLEXRX) GEL Apply 1 application topically 2 (two) times daily. Apply to  Affected skin  Area after rad tx and bedtime 11/05/13  Yes Marye Round, MD  levothyroxine (SYNTHROID, LEVOTHROID) 100 MCG tablet Take 50-100 mcg by mouth See admin instructions. Pt takes 1/2 tab for 50 mcg dose on Tuesday,thursday,saturday,sunday. 100 mcg  On Monday, Wednesday,friday   Yes Historical Provider, MD  Magnesium 250 MG TABS Take 250 mg by mouth daily.    Yes Historical Provider, MD  metoprolol succinate (TOPROL-XL) 25 MG 24 hr tablet Take 25 mg by mouth daily.   Yes Historical Provider, MD  Multiple Vitamin (MULTIVITAMIN) tablet Take 1 tablet by mouth daily.   Yes Historical Provider, MD  Omega-3 Fatty Acids (FISH OIL) 1000 MG CAPS Take 3,000 mg by mouth daily.    Yes Historical Provider, MD  sucralfate (CARAFATE) 1 G tablet Take 1 tablet (1 g total) by mouth 2 (two) times daily. 08/14/13 08/14/14 Yes Vicie Mutters, PA-C  TRUEPLUS LANCETS 33G MISC  10/05/13  Yes Historical Provider, MD  cholestyramine Lucrezia Starch) 4 G packet  12/08/13   Historical Provider, MD  dronabinol (MARINOL) 2.5 MG capsule Take 1 capsule (2.5 mg total) by mouth 2  (two) times daily as needed. 01/05/14   Theodis Blaze, MD  HYDROcodone-acetaminophen (NORCO/VICODIN) 5-325 MG per tablet Take 1 tablet by mouth every 6 (six) hours as needed for moderate pain. Takes 1/4 to 1 tab every 4-6 hours PRN pain 01/05/14   Theodis Blaze, MD  metFORMIN (GLUCOPHAGE-XR) 500 MG 24 hr tablet Take 1,000 mg by mouth 2 (two) times daily. 10/08/13 10/09/14  Unk Pinto, MD  ondansetron (ZOFRAN) 8 MG tablet Take 1-2 tablets (8-16 mg total) by mouth every 8 (eight) hours as needed for nausea. Take 1-2 tabs TID for nausea 01/05/14   Theodis Blaze, MD   BP 108/72  Pulse 102  Temp(Src) 97.4 F (36.3 C) (Oral)  Resp 16  Ht 5\' 1"  (1.549 m)  Wt 98 lb 5.2 oz (44.6 kg)  BMI 18.59 kg/m2  SpO2 96% Physical Exam  Nursing note and vitals reviewed. Constitutional: She appears well-developed and well-nourished.  Laying in bed. Appears uncomfortable  HENT:  Head: Normocephalic and atraumatic.  Eyes: Conjunctivae are normal. Right eye exhibits no discharge. Left eye exhibits no discharge.  Neck: Neck supple.  Cardiovascular: Normal rate, regular rhythm and normal heart sounds.  Exam reveals no gallop and no friction rub.   No murmur heard. Pulmonary/Chest: Effort normal and breath sounds normal. No respiratory distress.  Abdominal: Soft. She exhibits no distension. There is tenderness.  Epigastric and right upper quadrant tenderness with voluntary guarding. No rebound. No distention.  Musculoskeletal: She exhibits no edema and no tenderness.  Neurological: She is alert.  Skin: Skin is warm and dry.  Psychiatric: She has a normal mood and affect. Her behavior is normal. Thought content normal.    ED Course  Procedures (including critical care time) Labs Review Labs Reviewed  COMPREHENSIVE METABOLIC PANEL - Abnormal; Notable for the following:    Sodium 132 (*)    Potassium 3.4 (*)    Chloride 91 (*)    Glucose, Bld 171 (*)    Calcium 8.2 (*)    Total Protein 4.9 (*)    Albumin 2.1  (*)    ALT 37 (*)    Alkaline Phosphatase 371 (*)    GFR calc non Af Amer 87 (*)    All other components within normal limits  CBC WITH DIFFERENTIAL - Abnormal; Notable for the following:    RBC 3.86 (*)    HCT 35.3 (*)    RDW 15.7 (*)    Monocytes Relative 13 (*)    All other components within normal limits  BASIC METABOLIC PANEL - Abnormal; Notable for the following:    Sodium 132 (*)    Potassium 3.5 (*)    Chloride 93 (*)    Glucose, Bld 156 (*)    Creatinine, Ser 0.44 (*)    Calcium 7.6 (*)    All other components within normal limits  CBC - Abnormal; Notable for the following:    RBC 3.42 (*)    Hemoglobin 10.8 (*)    HCT 31.8 (*)    RDW 15.8 (*)    All other components within normal limits  HEMOGLOBIN A1C - Abnormal; Notable for the following:    Hemoglobin A1C 7.7 (*)    Mean Plasma Glucose 174 (*)    All other components within normal limits  TSH - Abnormal; Notable for the following:    TSH 24.330 (*)    All other components within normal limits  CBC - Abnormal; Notable for the following:    RBC 3.48 (*)    Hemoglobin 11.0 (*)    HCT 32.1 (*)    RDW 16.1 (*)    All other components within normal limits  BASIC METABOLIC PANEL - Abnormal; Notable for the following:    Sodium 132 (*)    Chloride 95 (*)    Glucose, Bld 104 (*)    Calcium 7.9 (*)    GFR calc non Af Amer 83 (*)    All other components within normal limits  CBC - Abnormal; Notable for the following:    RBC 2.64 (*)    Hemoglobin 8.5 (*)    HCT 24.6 (*)    RDW 16.4 (*)  Platelets 131 (*)    All other components within normal limits  BASIC METABOLIC PANEL - Abnormal; Notable for the following:    Potassium 2.3 (*)    Creatinine, Ser 0.36 (*)    Calcium 5.9 (*)    All other components within normal limits  GLUCOSE, CAPILLARY - Abnormal; Notable for the following:    Glucose-Capillary 154 (*)    All other components within normal limits  BASIC METABOLIC PANEL - Abnormal; Notable for the  following:    Sodium 132 (*)    Potassium 3.6 (*)    Glucose, Bld 117 (*)    Creatinine, Ser 0.41 (*)    Calcium 7.9 (*)    All other components within normal limits  MAGNESIUM - Abnormal; Notable for the following:    Magnesium 1.4 (*)    All other components within normal limits  GLUCOSE, CAPILLARY - Abnormal; Notable for the following:    Glucose-Capillary 128 (*)    All other components within normal limits  CBC - Abnormal; Notable for the following:    RBC 3.34 (*)    Hemoglobin 10.8 (*)    HCT 31.5 (*)    RDW 17.1 (*)    All other components within normal limits  BASIC METABOLIC PANEL - Abnormal; Notable for the following:    Sodium 131 (*)    Calcium 8.1 (*)    GFR calc non Af Amer 87 (*)    All other components within normal limits  GLUCOSE, CAPILLARY - Abnormal; Notable for the following:    Glucose-Capillary 104 (*)    All other components within normal limits  URINE CULTURE  URINALYSIS, ROUTINE W REFLEX MICROSCOPIC  MAGNESIUM  URINALYSIS, ROUTINE W REFLEX MICROSCOPIC  GLUCOSE, CAPILLARY  GLUCOSE, CAPILLARY  GLUCOSE, CAPILLARY    Imaging Review No results found.   EKG Interpretation None    and  MDM   Final diagnoses:  Anorexia  Hypotension       Virgel Manifold, MD 01/05/14 2238

## 2014-01-05 NOTE — Telephone Encounter (Signed)
Patient's son, Chip, called with concerns about bedsore noted about 1 1/2 inch long, denies skin is broken.  Asked him where bedsore is and he states "he doesn't know"  Notes his wife, Lenna Sciara, mentioned concerns about a bedsore. Patient was discharged from the hospital this afternoon and denies hospital treated her for bedsores.  Dr. Doyle Askew placed orders for follow up care with [facility].  Per Kelby Aline, PA-C orders, advised Chip to go to pharmacy and get [place] to place under area of questionable bedsore until [facility] receives orders and can evaluate patient.  Advised ov if no relief or if symptoms worsen.  Chip advised Hospice will not be contacted until they see if patient is able to tolerate PT and OT with [facility].

## 2014-01-05 NOTE — Evaluation (Signed)
Occupational Therapy Evaluation Patient Details Name: [name] MRN: [id number] DOB: [birth year] Today's Date: 01/05/2014    History of Present Illness 80 yo female admitted 12/31/13 with dehydtration, weakness, FTT. H/O pancreatic cancer.   Clinical Impression   Pt presents to OT with decreased I with ADL activity due to problems listed below. Pt will benefit from skilled OT toincrease I with ADL activity and return to PLOF    Follow Up Recommendations  SNF    Equipment Recommendations  None recommended by OT       Precautions / Restrictions Precautions Precautions: Fall      Mobility Bed Mobility                  Transfers Overall transfer level: Needs assistance Equipment used: Rolling walker (2 wheeled) Transfers: Sit to/from Stand (x 2) Sit to Stand: Min assist         General transfer comment: multi-modal cues for sequencing, had placement    Balance Overall balance assessment: Needs assistance         Standing balance support: Bilateral upper extremity supported Standing balance-Leahy Scale: Poor                              ADL Overall ADL's : Needs assistance/impaired     Grooming: Minimal assistance   Upper Body Bathing: Moderate assistance   Lower Body Bathing: Moderate assistance   Upper Body Dressing : Minimal assistance   Lower Body Dressing: Maximal assistance   Toilet Transfer: Moderate assistance   Toileting- Clothing Manipulation and Hygiene: Moderate assistance       Functional mobility during ADLs: Moderate assistance General ADL Comments: Pt very weak and fatigued quickly     Vision                            Extremity/Trunk Assessment Upper Extremity Assessment Upper Extremity Assessment: Generalized weakness           Communication Communication Communication: No difficulties   Cognition Arousal/Alertness: Awake/alert Behavior During Therapy: Flat affect Overall Cognitive  Status: Difficult to assess Area of Impairment: Orientation Orientation Level: Situation;Place             General Comments: pt asked to get back in chair while sitting up in it; seh does not intitiate conversation and verbalizes only one word/short sentence answers   General Comments       Exercises       Shoulder Instructions      Home Living Family/patient expects to be discharged to:: Private residence Living Arrangements: Alone Available Help at Discharge: Available PRN/intermittently;Family Type of Home: House Home Access: Level entry     Home Layout: One level     Bathroom Shower/Tub: Teacher, early years/pre: Standard     Home Equipment: Bedside commode;Walker - 2 wheels   Additional Comments: pt states family will be with her. No family present.      Prior Functioning/Environment               OT Diagnosis: Generalized weakness   OT Problem List: Decreased strength;Decreased activity tolerance   OT Treatment/Interventions: Self-care/ADL training;DME and/or AE instruction;Energy conservation;Patient/family education    OT Goals(Current goals can be found in the care plan section) Acute Rehab OT Goals Patient Stated Goal: get up with CP A and figure out insurance OT Goal Formulation: With patient Time For Goal  Achievement: 01/19/14 Potential to Achieve Goals: Good  OT Frequency: Min 2X/week   Barriers to D/C: Decreased caregiver support             End of Session    Activity Tolerance: Patient limited by fatigue Patient left: in chair with call bell and chair alarm   Time: 1249-1302 OT Time Calculation (min): 13 min Charges:  OT General Charges $OT Visit: 1 Procedure OT Evaluation $Initial OT Evaluation Tier I: 1 Procedure OT Treatments $Self Care/Home Management : 8-22 mins G-Codes:    Betsy Pries 2014/01/27, 1:02 PM

## 2014-01-05 NOTE — Discharge Summary (Signed)
Physician Discharge Summary  [name] UXN:[id number] DOB: 09-14-1927 DOA: 12/31/2013  PCP: Bridget Richards, MD  Admit date: 12/31/2013 Discharge date: 01/05/2014  Recommendations for Outpatient Follow-up:  1. Pt will need to follow up with PCP in 2-3 weeks post discharge 2. Pt will follow up with Dr. Juliann Franklin in an outpatient setting   Discharge Diagnoses: Dehydration  Active Problems:   Dehydration   Anorexia   Protein-calorie malnutrition, severe   Weakness generalized   Palliative care encounter   DNR no code (do not resuscitate)  Discharge Condition: Stable  Diet recommendation: Heart healthy diet discussed in details   Brief narrative:  Pt is 80 yo female with pancreatic cancer, follows with Dr. Juliann Franklin, presented to [facility] ED with main concern of progressive failure to thrive, poor oral intake, generalized weakness. Pt explains she underwent chemotherapy few weeks ago and was getting progressively weaker and unable to eat much. She also reports intermittent upper quadrants abdominal pain, throbbing, 5/10 in severity, non radiating, no specific alleviating or aggravating factors. She denies fevers, chills, no chest pain or shortness of breath, no specific focal neurological symptoms.   In ED, pt is hemodynamically stable, physical exam notable for clinical dehydration. TR[facility] asked to admit for further evaluation.   Assessment and Plan:  Active Problems:  Generalized weakness with FTT  - most likely secondary to progressive nature of pancreatic cancer  - pt reports feeling better this AM but still tired, participated in PT this AM and wants to go home  - continue to provide analgesia, antiemetics as needed  - tolerating regular diet well  Dysphagia  - oral thrush noted  - continue Fluconazole IV day #6 and will stop after today's dose  Pancreatic cancer  - appreciate Dr. Juliann Franklin input  - pt will follow up in an outpatient setting  Acute on chronic blood loss anemia  - drop  in Hg since admission and possibly dilutional from IVF  - no sings of active bleeding HTN  - BP soft on admission - continue antihypertensive regimen form home which includes HCTZ and metoprolol  Hypothyroidism  - continue synthroid  Diabetes mellitus type II  - continue metformin  Hypokalemia  - supplemented and WNL this AM Hyponatremia  - secondary to pre renal etiology  - sodium now WNL  Severe malnutrition  - secondary to progressive nature of the illness  - nutrition consultation appreciated   Consultants:  Oncology  Procedures/Studies:  None  Antibiotics:  None  Code Status: DNR  Family Communication: Pt at bedside, son over the phone  Discharge Exam: Filed Vitals:   01/05/14 1100  BP: 108/72  Pulse: 102  Temp: 97.4 F (36.3 C)  Resp: 16   Filed Vitals:   01/04/14 2233 01/05/14 0521 01/05/14 0900 01/05/14 1100  BP: 105/58 118/70 118/75 108/72  Pulse: 83 117 108 102  Temp: 98.3 F (36.8 C) 98.2 F (36.8 C) 98.2 F (36.8 C) 97.4 F (36.3 C)  TempSrc: Oral Oral Oral Oral  Resp: $Remo'16 16 16 16  'lVpLQ$ Height:      Weight:      SpO2: 92% 95% 97% 96%    General: Pt is alert, follows commands appropriately, frail and cachectic  Cardiovascular: Regular rate and rhythm, no rubs, no gallops Respiratory: Clear to auscultation bilaterally, no wheezing, no crackles, no rhonchi Abdominal: Soft, non tender, non distended, bowel sounds +, no guarding Extremities: no edema, no cyanosis, pulses palpable bilaterally DP and PT  Discharge Instructions  Discharge Instructions  Diet - low sodium heart healthy    Complete by:  As directed      Increase activity slowly    Complete by:  As directed             Medication List    STOP taking these medications       BLOOD GLUCOSE MONITOR KIT Kit     hydrochlorothiazide 25 MG tablet  Commonly known as:  HYDRODIURIL      TAKE these medications       cetirizine 10 MG tablet  Commonly known as:  ZYRTEC  Take 10 mg by  mouth daily.     cholestyramine 4 G packet  Commonly known as:  QUESTRAN     DEXILANT 60 MG capsule  Generic drug:  dexlansoprazole  Take 60 mg by mouth daily.     dronabinol 2.5 MG capsule  Commonly known as:  MARINOL  Take 1 capsule (2.5 mg total) by mouth 2 (two) times daily as needed.     estradiol 2 MG vaginal ring  Commonly known as:  ESTRING  Place 2 mg vaginally every 3 (three) months. follow package directions     Fish Oil 1000 MG Caps  Take 3,000 mg by mouth daily.     FLAXSEED (LINSEED) PO  Take 1 capsule by mouth daily.     hyaluronate sodium Gel  Apply 1 application topically 2 (two) times daily. Apply to  Affected skin  Area after rad tx and bedtime     HYDROcodone-acetaminophen 5-325 MG per tablet  Commonly known as:  NORCO/VICODIN  Take 1 tablet by mouth every 6 (six) hours as needed for moderate pain. Takes 1/4 to 1 tab every 4-6 hours PRN pain     levothyroxine 100 MCG tablet  Commonly known as:  SYNTHROID, LEVOTHROID  Take 50-100 mcg by mouth See admin instructions. Pt takes 1/2 tab for 50 mcg dose on Tuesday,thursday,saturday,sunday. 100 mcg  On Monday, Wednesday,friday     Magnesium 250 MG Tabs  Take 250 mg by mouth daily.     metFORMIN 500 MG 24 hr tablet  Commonly known as:  GLUCOPHAGE-XR  Take 1,000 mg by mouth 2 (two) times daily.     metoprolol succinate 25 MG 24 hr tablet  Commonly known as:  TOPROL-XL  Take 25 mg by mouth daily.     multivitamin tablet  Take 1 tablet by mouth daily.     ondansetron 8 MG tablet  Commonly known as:  ZOFRAN  Take 1-2 tablets (8-16 mg total) by mouth every 8 (eight) hours as needed for nausea. Take 1-2 tabs TID for nausea     sucralfate 1 G tablet  Commonly known as:  CARAFATE  Take 1 tablet (1 g total) by mouth 2 (two) times daily.     TRUEPLUS LANCETS 33G Misc     Vitamin D-3 5000 UNITS Tabs  Take by mouth daily.           Follow-up Information   Schedule an appointment as soon as possible  for a visit with [name], MD.   Specialty:  Internal Medicine   Contact information:   [address] [place] [address] [phone]       Follow up with Debbora Presto, MD. (As needed, If symptoms worsen)    Specialty:  Internal Medicine   Contact information:   [address] [place] [address] [phone]        The results of significant diagnostics  from this hospitalization (including imaging, microbiology, ancillary and laboratory) are listed below for reference.     Microbiology: Recent Results (from the past 240 hour(s))  URINE CULTURE     Status: None   Collection Time    12/31/13  8:54 PM      Result Value Ref Range Status   Specimen Description URINE, RANDOM   Final   Special Requests NONE   Final   Culture  Setup Time     Final   Value: 01/01/2014 01:09     Performed at [facility]     Final   Value: NO GROWTH     Performed at [facility]   Culture     Final   Value: NO GROWTH     Performed at [facility]   Report Status 01/01/2014 FINAL   Final     Labs: Basic Metabolic Panel:  Recent Labs Lab 12/31/13 1250 01/01/14 0432 01/02/14 0428 01/04/14 0528 01/04/14 1252 01/05/14 0524  NA 132* 132* 132* 139 132* 131*  K 3.4* 3.5* 3.8 2.3* 3.6* 4.5  CL 91* 93* 95* 108 98 97  CO2 $Re'30 28 28 20 22 25  'CRq$ GLUCOSE 171* 156* 104* 77 117* 85  BUN $Re'10 10 10 9 11 13  'uAC$ CREATININE 0.50 0.44* 0.57 0.36* 0.41* 0.51  CALCIUM 8.2* 7.6* 7.9* 5.9* 7.9* 8.1*  MG 1.7  --   --   --  1.4*  --    Liver Function Tests:  Recent Labs Lab 12/31/13 1250  AST 35  ALT 37*  ALKPHOS 371*  BILITOT 0.4  PROT 4.9*  ALBUMIN 2.1*   CBC:  Recent Labs Lab 12/31/13 1250 01/01/14 0432 01/02/14 0428 01/04/14 0528 01/05/14 0524  WBC 7.0 6.5 7.3 5.5 7.5  NEUTROABS 4.1  --   --   --   --   HGB 12.1 10.8* 11.0* 8.5* 10.8*  HCT 35.3* 31.8* 32.1* 24.6* 31.5*  MCV 91.5 93.0 92.2 93.2 94.3   PLT 174 160 164 131* 167   CBG:  Recent Labs Lab 01/04/14 0825 01/04/14 1117 01/04/14 1657 01/05/14 0756 01/05/14 1208  GLUCAP 96 128* 97 85 104*     SIGNED: Time coordinating discharge: Over 30 minutes  Theodis Blaze, MD  [facility] 01/05/2014, 1:04 PM Pager [phone]  If 7PM-7AM, please contact night-coverage [email or web address] Password [email or web address]

## 2014-01-05 NOTE — Progress Notes (Signed)
Physical Therapy Treatment Patient Details Name: KAYSE PUCCINI MRN: [id number] DOB: 1928-04-10 Today's Date: 01/23/14    History of Present Illness 80 yo female admitted 12/31/13 with dehydtration, weakness, FTT. H/O pancreatic cancer.    PT Comments    Pt progressing slowly, continues to fatigue rapidly  Follow Up Recommendations  Home health PT;Supervision/Assistance - 24 hour     Equipment Recommendations  None recommended by PT    Recommendations for Other Services       Precautions / Restrictions Precautions Precautions: Fall Restrictions Weight Bearing Restrictions: No    Mobility  Bed Mobility                  Transfers Overall transfer level: Needs assistance Equipment used: Rolling walker (2 wheeled) Transfers: Sit to/from Stand (x 2) Sit to Stand: Min assist         General transfer comment: multi-modal cues for sequencing, had placement  Ambulation/Gait Ambulation/Gait assistance: Min assist Ambulation Distance (Feet): 7 Feet (times 2) Assistive device: Rolling walker (2 wheeled) Gait Pattern/deviations: Step-through pattern;Decreased stride length;Narrow base of support;Leaning posteriorly   Gait velocity interpretation: Below normal speed for age/gender General Gait Details: fatigues quickly; sitting rest between short distance gait; cues for RW safety and step length   Stairs            Wheelchair Mobility    Modified Rankin (Stroke Patients Only)       Balance Overall balance assessment: Needs assistance         Standing balance support: Bilateral upper extremity supported Standing balance-Leahy Scale: Poor                      Cognition Arousal/Alertness: Awake/alert Behavior During Therapy: Flat affect Overall Cognitive Status: Difficult to assess Area of Impairment: Orientation Orientation Level: Situation;Place             General Comments: pt asked to get back in chair while sitting up in it;  seh does not intitiate conversation and verbalizes only one word/short sentence answers    Exercises      General Comments        Pertinent Vitals/Pain No c/o pain    Home Living                      Prior Function            PT Goals (current goals can now be found in the care plan section) Acute Rehab PT Goals Patient Stated Goal: pt did not state PT Goal Formulation: With patient Time For Goal Achievement: 01/16/14 Potential to Achieve Goals: Good Progress towards PT goals: Progressing toward goals    Frequency  Min 3X/week    PT Plan Current plan remains appropriate    Co-evaluation             End of Session Equipment Utilized During Treatment: Gait belt Activity Tolerance: Patient limited by fatigue Patient left: in chair;with call bell/phone within reach;with chair alarm set     Time: 7322-0254 PT Time Calculation (min): 13 min  Charges:  $Gait Training: 8-22 mins                    G Codes:      Neil Crouch 23-Jan-2014, 11:56 AM

## 2014-01-05 NOTE — Discharge Instructions (Signed)
Dehydration, Adult Dehydration is when you lose more fluids from the body than you take in. Vital organs like the kidneys, brain, and heart cannot function without a proper amount of fluids and salt. Any loss of fluids from the body can cause dehydration.  CAUSES   Vomiting.  Diarrhea.  Excessive sweating.  Excessive urine output.  Fever. SYMPTOMS  Mild dehydration  Thirst.  Dry lips.  Slightly dry mouth. Moderate dehydration  Very dry mouth.  Sunken eyes.  Skin does not bounce back quickly when lightly pinched and released.  Dark urine and decreased urine production.  Decreased tear production.  Headache. Severe dehydration  Very dry mouth.  Extreme thirst.  Rapid, weak pulse (more than 100 beats per minute at rest).  Cold hands and feet.  Not able to sweat in spite of heat and temperature.  Rapid breathing.  Blue lips.  Confusion and lethargy.  Difficulty being awakened.  Minimal urine production.  No tears. DIAGNOSIS  Your caregiver will diagnose dehydration based on your symptoms and your exam. Blood and urine tests will help confirm the diagnosis. The diagnostic evaluation should also identify the cause of dehydration. TREATMENT  Treatment of mild or moderate dehydration can often be done at home by increasing the amount of fluids that you drink. It is best to drink small amounts of fluid more often. Drinking too much at one time can make vomiting worse. Refer to the home care instructions below. Severe dehydration needs to be treated at the hospital where you will probably be given intravenous (IV) fluids that contain water and electrolytes. HOME CARE INSTRUCTIONS   Ask your caregiver about specific rehydration instructions.  Drink enough fluids to keep your urine clear or pale yellow.  Drink small amounts frequently if you have nausea and vomiting.  Eat as you normally do.  Avoid:  Foods or drinks high in sugar.  Carbonated  drinks.  Juice.  Extremely hot or cold fluids.  Drinks with caffeine.  Fatty, greasy foods.  Alcohol.  Tobacco.  Overeating.  Gelatin desserts.  Wash your hands well to avoid spreading bacteria and viruses.  Only take over-the-counter or prescription medicines for pain, discomfort, or fever as directed by your caregiver.  Ask your caregiver if you should continue all prescribed and over-the-counter medicines.  Keep all follow-up appointments with your caregiver. SEEK MEDICAL CARE IF:  You have abdominal pain and it increases or stays in one area (localizes).  You have a rash, stiff neck, or severe headache.  You are irritable, sleepy, or difficult to awaken.  You are weak, dizzy, or extremely thirsty. SEEK IMMEDIATE MEDICAL CARE IF:   You are unable to keep fluids down or you get worse despite treatment.  You have frequent episodes of vomiting or diarrhea.  You have blood or green matter (bile) in your vomit.  You have blood in your stool or your stool looks black and tarry.  You have not urinated in 6 to 8 hours, or you have only urinated a small amount of very dark urine.  You have a fever.  You faint. MAKE SURE YOU:   Understand these instructions.  Will watch your condition.  Will get help right away if you are not doing well or get worse. Document Released: 07/24/2005 Document Revised: 10/16/2011 Document Reviewed: 03/13/2011 [employer] Patient Information 2014 [employer].  

## 2014-01-06 ENCOUNTER — Telehealth: Payer: Self-pay | Admitting: Internal Medicine

## 2014-01-06 NOTE — Telephone Encounter (Signed)
S/W SON TODAY RE PT NOT HAVING TO COME IN FOR NUTR APPT (PER BN LAST WEEK) - COVERING NUTR WILL CALL PT. S/W SON ROBERT HE IS AWARE AND COMMENTS ADDED TO APPT TO CALL ROBERT AND NUMBERS ARE LISTED IN COMMENT SECTION.

## 2014-01-07 ENCOUNTER — Ambulatory Visit: Payer: Medicare Other | Admitting: Nutrition

## 2014-01-07 ENCOUNTER — Telehealth: Payer: Self-pay | Admitting: *Deleted

## 2014-01-07 NOTE — Progress Notes (Addendum)
Unable to call pt's son Jacquelynn Friend at 12:00 as scheduled due to conflict.  Attempted to call at 3:30 pm, without success.  Left message for pt's son to call back to discuss his Mother's needs.    Pt's son called back right after message left and spent about 30 minutes on the phone explaining his Mother's decreased appetite and po intake since returning from the hospital, he is concerned about her blood sugar.  Pt does not like sweet things and has been taking in very little by mouth.  Son worried she has "given up"  Hospice is being called in and pt's son has spoken to the RD at [facility].    Encouraged pt's son to provide his mother with anything she will consume and not worry about the sugar content.  Recommend soft moist savory foods, such as purred chicken soup or mashed potatoes with gravy and push fluids to avoid dehydration.  Pt's son will be with his mother tomorrow and will have a better idea of how she is per son.  Recommend pt's son call back with report and consider coming in for appointment.  Pt's son has RD number.

## 2014-01-07 NOTE — Telephone Encounter (Signed)
Son,Chip, called and states patient and family have decided to request Hospice referral.  They request referral be to [facility].  Son states patient is incontinent of urine and stool.

## 2014-01-08 ENCOUNTER — Telehealth: Payer: Self-pay | Admitting: Internal Medicine

## 2014-01-08 ENCOUNTER — Telehealth: Payer: Self-pay | Admitting: *Deleted

## 2014-01-08 NOTE — Telephone Encounter (Signed)
Hospice called to confirm,that the Hospice Dr is OK to treat patient?  Dr Melford Aase advised yes, the Hopsice Dr can treat the patient.    Thank you, Katrina Judeth [facility] [phone] Fax [phone]

## 2014-01-08 NOTE — Telephone Encounter (Signed)
Hospice nurse admitted patient to [facility] and requested verbal order for DNR and order that Hospice doctor can sign the order.  OK per Dr Melford Aase.

## 2014-01-09 ENCOUNTER — Telehealth: Payer: Self-pay

## 2014-01-09 NOTE — Telephone Encounter (Signed)
Fax informing [name] of pt admission to hospice services received, Dr Melford Aase is attemding

## 2014-01-15 ENCOUNTER — Ambulatory Visit: Admission: RE | Admit: 2014-01-15 | Payer: Medicare Other | Source: Ambulatory Visit | Admitting: Radiation Oncology

## 2014-01-21 ENCOUNTER — Other Ambulatory Visit: Payer: Self-pay | Admitting: Internal Medicine

## 2014-02-04 DEATH — deceased

## 2014-02-05 ENCOUNTER — Telehealth: Payer: Self-pay

## 2014-02-05 NOTE — Telephone Encounter (Signed)
Patient died per [facility] °

## 2014-03-24 ENCOUNTER — Other Ambulatory Visit: Payer: Self-pay | Admitting: Internal Medicine

## 2015-01-05 ENCOUNTER — Encounter: Payer: Self-pay | Admitting: Internal Medicine

## 2015-05-17 IMAGING — CT CT CHEST W/ CM
2 of 3 series · 15 of 36 positions shown, 18 images · IV contrast (75CC OMNI 300)
Comparison: CT ABD/PELVIS W CM dated 10/03/2013

CLINICAL DATA: Pancreatic cancer. Evaluate metastatic disease.
History of right breast cancer.

EXAM:
CT CHEST WITH CONTRAST
TECHNIQUE: Multidetector CT imaging of the chest was performed during
intravenous contrast administration.
CONTRAST:  75mL OMNIPAQUE IOHEXOL 300 MG/ML  SOLN

[Series 2: chest with · axial · 0.65mm/px · z∈[-289,-39]mm · 12 of 60 slices shown, 15 images]
[im 5/60  mediastinal]
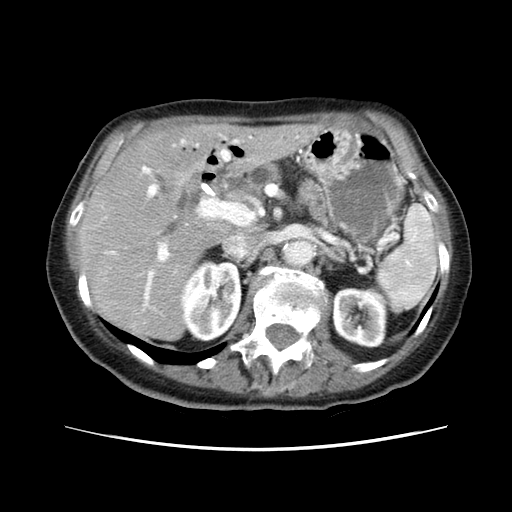
[im 5/60  lung]
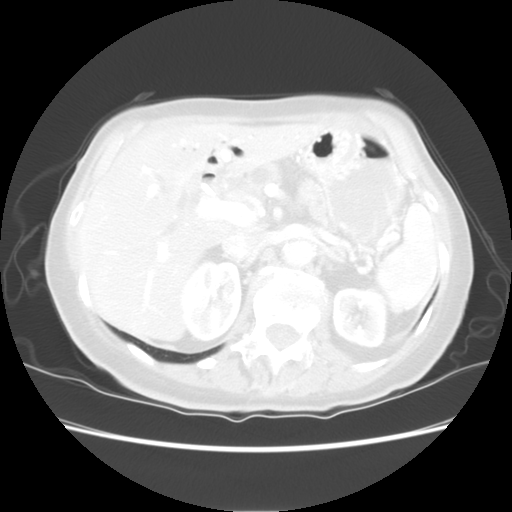
[im 9/60  lung]
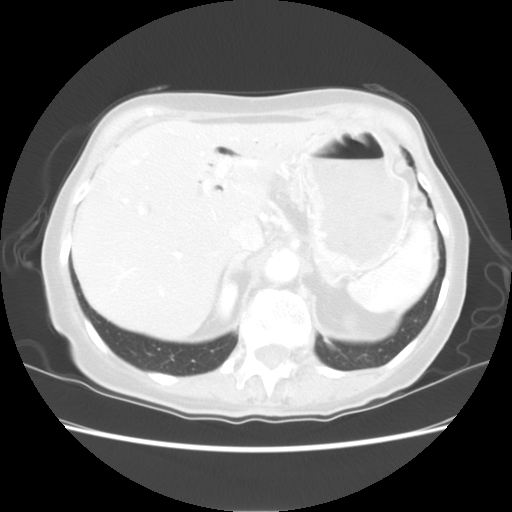
[im 14/60  lung]
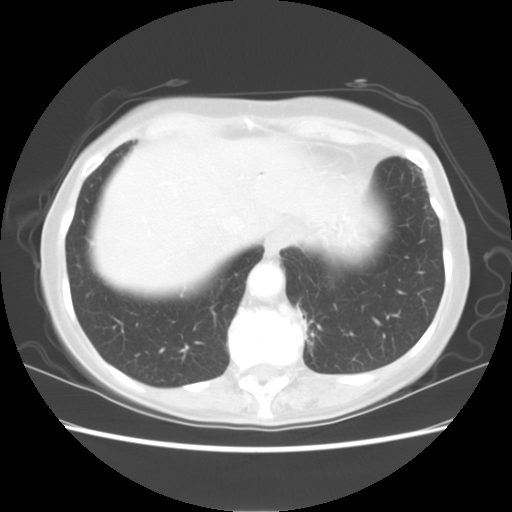
[im 18/60  lung]
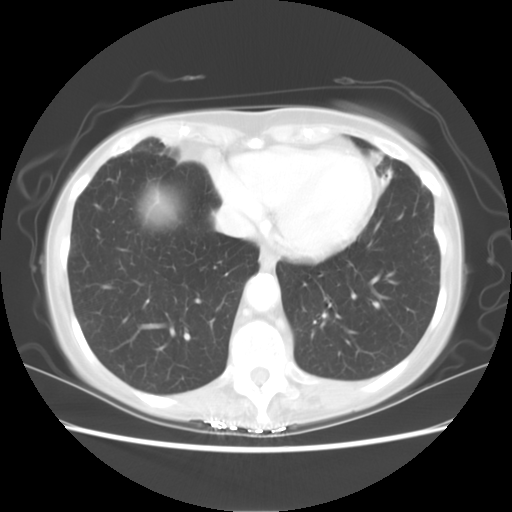
[im 22/60  mediastinal]
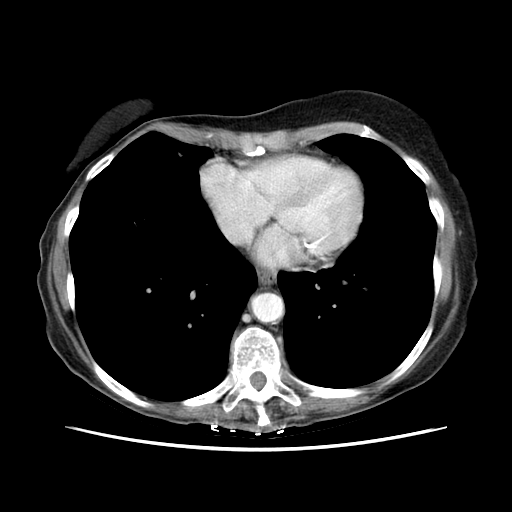
[im 22/60  lung]
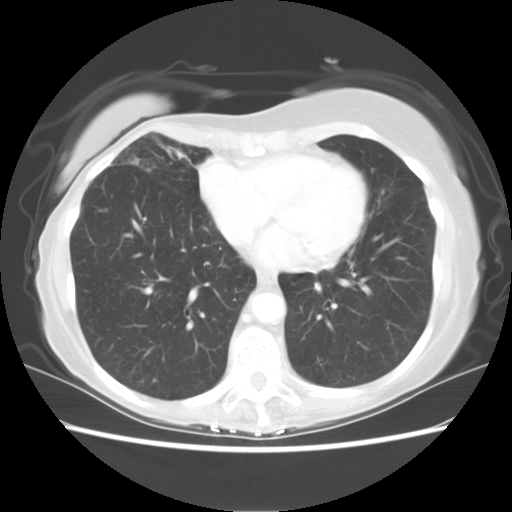
[im 27/60  lung]
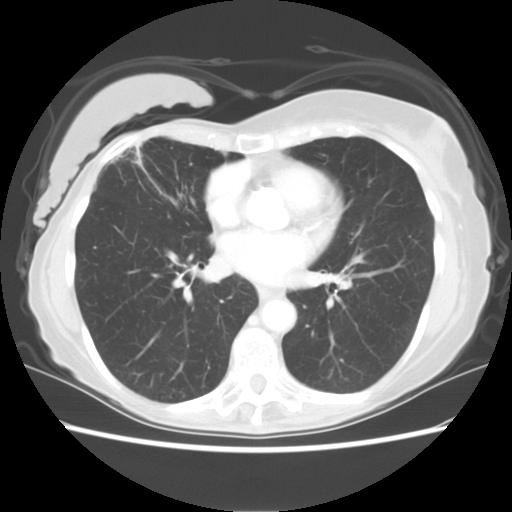
[im 33/60  lung]
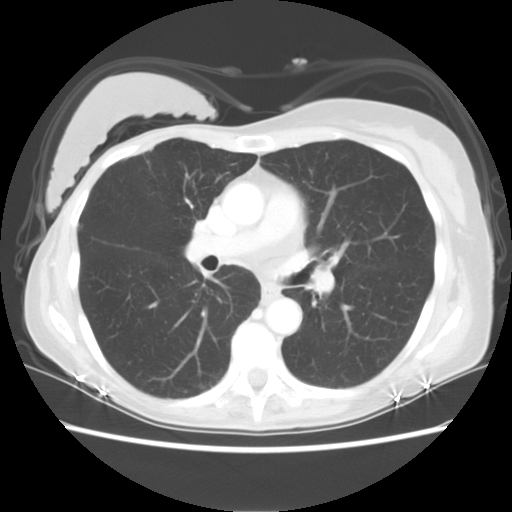
[im 38/60  lung]
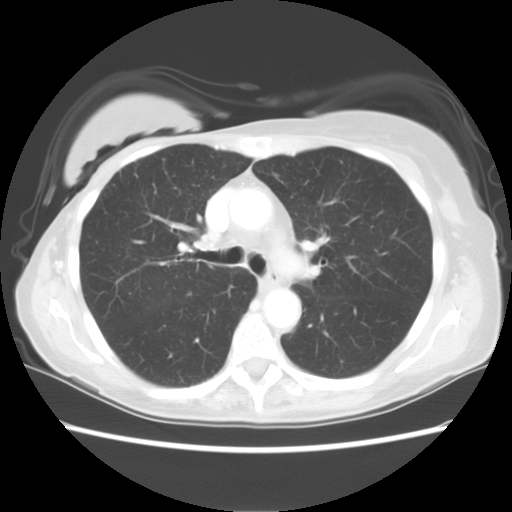
[im 42/60  mediastinal]
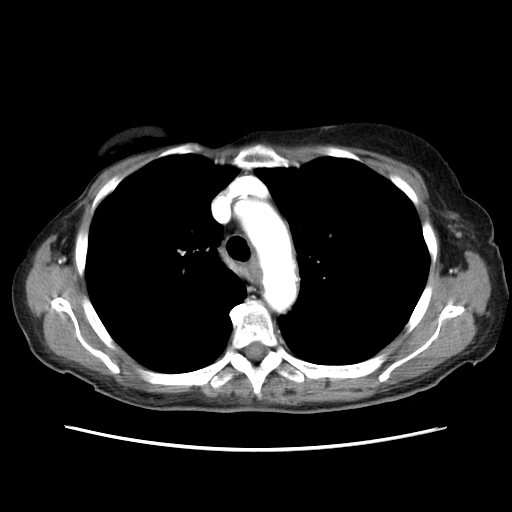
[im 42/60  lung]
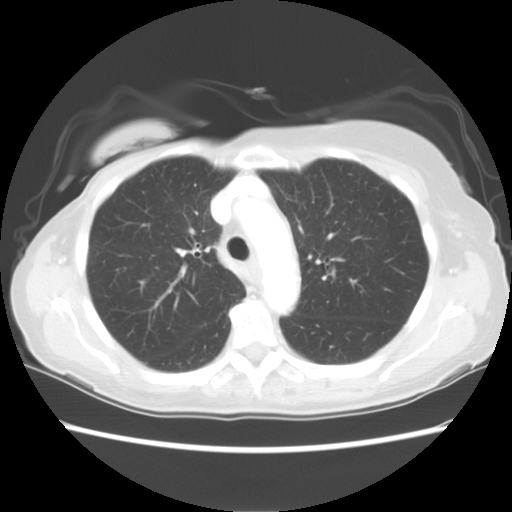
[im 46/60  lung]
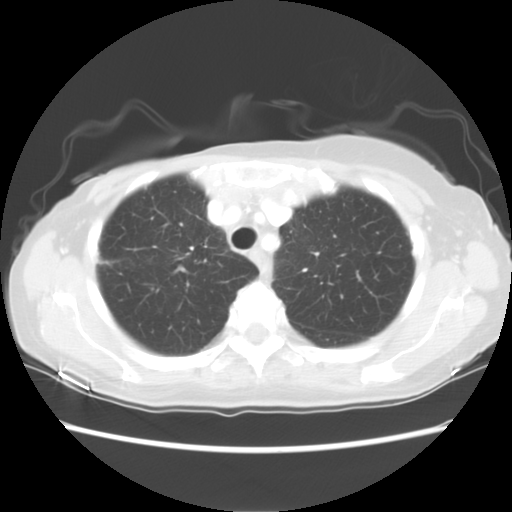
[im 51/60  lung]
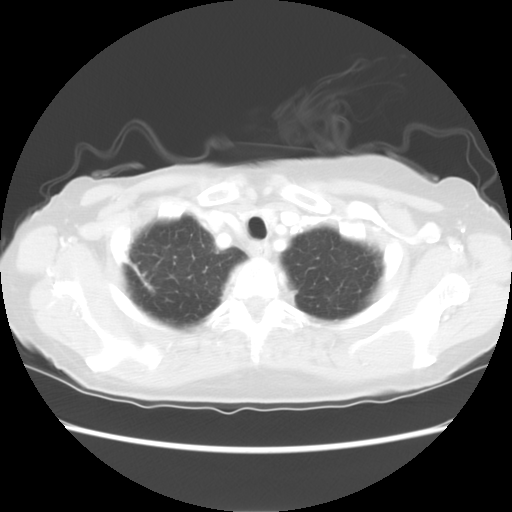
[im 55/60  lung]
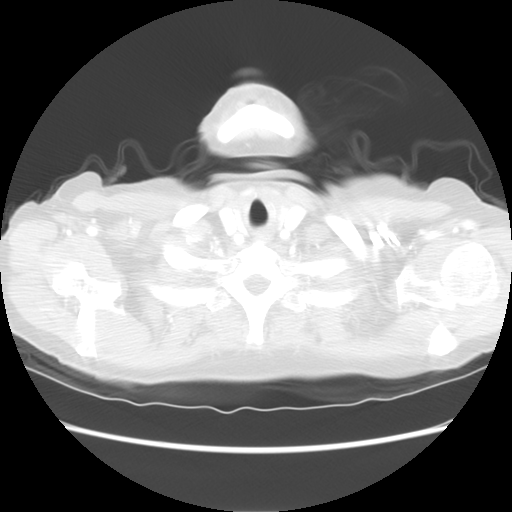

[Series 401: cor · coronal · 0.65mm/px · 3 of 92 slices shown]
[im 19/92  lung]
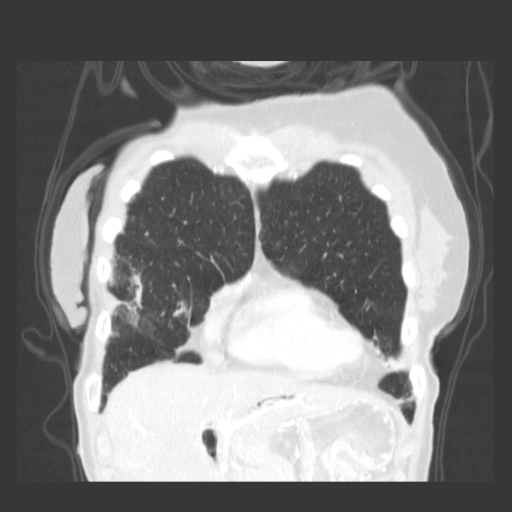
[im 37/92  lung]
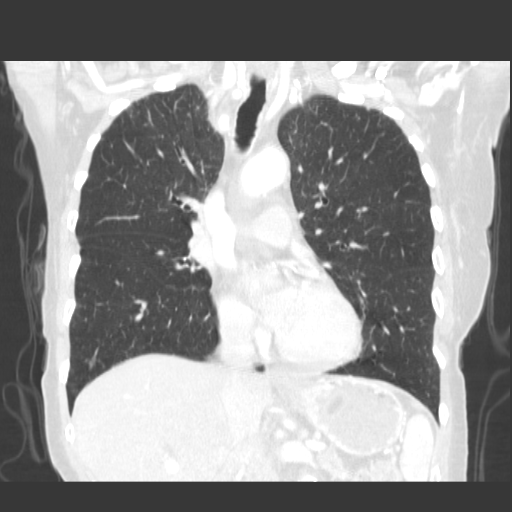
[im 55/92  lung]
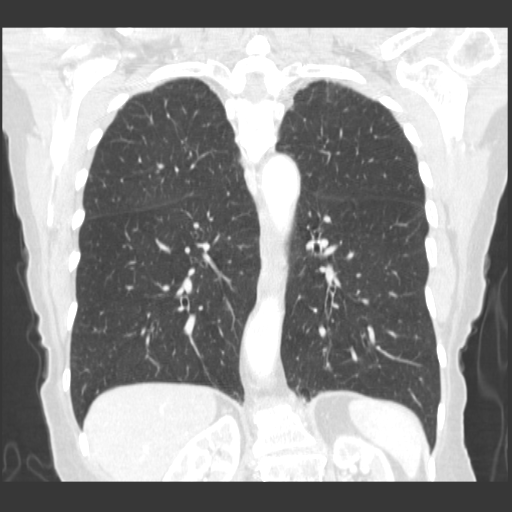

[15 of 36 positions shown; findings below may reference images not displayed]

FINDINGS: No pathologically enlarged mediastinal, hilar or axillary lymph
nodes. No internal mammary adenopathy. Heart size normal.
Three-vessel coronary artery calcification. No pericardial effusion.

Biapical pleural parenchymal scarring. Mild subpleural radiation
fibrosis in the anterior right hemi thorax. Findings are worst in
the right middle lobe. Mild scattered scarring. Lungs are otherwise
clear. No pleural fluid. Airway is unremarkable.

Incidental imaging of the upper abdomen shows pneumobilia with a
stent in the common bile duct. Biliary ductal dilatation is again
seen and partially imaged. Marked pancreatic ductal dilatation with
atrophy of the body and tail. The primary pancreatic mass in the
head and uncinate process, as seen on 10/03/2013, was not imaged
today. Visualized portions of the adrenal glands, kidneys, spleen
and stomach are grossly unremarkable. No worrisome lytic or
sclerotic lesions. Degenerative changes are seen in the spine.
IMPRESSION: 1. No evidence of metastatic disease in the chest.
2. Three-vessel coronary artery calcification.
3. Interval common bile duct stent placement with pneumobilia and
persistent marked dilatation of the biliary tree and pancreatic
duct, incompletely imaged. Pancreatic mass was not imaged on the
current exam. Please refer to examination of 10/03/2013 for further
details.
